# Patient Record
Sex: Female | Born: 1945
Health system: Southern US, Community
[De-identification: ages and names within clinical notes are randomized; demographics above are authoritative.]

## PROBLEM LIST (undated history)

## (undated) DIAGNOSIS — U099 Post covid-19 condition, unspecified: Secondary | ICD-10-CM

## (undated) DIAGNOSIS — G9001 Carotid sinus syncope: Secondary | ICD-10-CM

## (undated) DIAGNOSIS — E785 Hyperlipidemia, unspecified: Secondary | ICD-10-CM

## (undated) DIAGNOSIS — E063 Autoimmune thyroiditis: Secondary | ICD-10-CM

## (undated) DIAGNOSIS — Z87440 Personal history of urinary (tract) infections: Secondary | ICD-10-CM

## (undated) DIAGNOSIS — K5732 Diverticulitis of large intestine without perforation or abscess without bleeding: Secondary | ICD-10-CM

## (undated) DIAGNOSIS — N3020 Other chronic cystitis without hematuria: Secondary | ICD-10-CM

## (undated) DIAGNOSIS — Z8632 Personal history of gestational diabetes: Secondary | ICD-10-CM

## (undated) DIAGNOSIS — M791 Myalgia, unspecified site: Secondary | ICD-10-CM

## (undated) DIAGNOSIS — I2510 Atherosclerotic heart disease of native coronary artery without angina pectoris: Secondary | ICD-10-CM

## (undated) DIAGNOSIS — I10 Essential (primary) hypertension: Secondary | ICD-10-CM

## (undated) DIAGNOSIS — Z973 Presence of spectacles and contact lenses: Secondary | ICD-10-CM

## (undated) DIAGNOSIS — E782 Mixed hyperlipidemia: Secondary | ICD-10-CM

## (undated) DIAGNOSIS — G8929 Other chronic pain: Secondary | ICD-10-CM

## (undated) DIAGNOSIS — E039 Hypothyroidism, unspecified: Secondary | ICD-10-CM

## (undated) DIAGNOSIS — R55 Syncope and collapse: Secondary | ICD-10-CM

## (undated) DIAGNOSIS — O24419 Gestational diabetes mellitus in pregnancy, unspecified control: Secondary | ICD-10-CM

## (undated) DIAGNOSIS — N950 Postmenopausal bleeding: Secondary | ICD-10-CM

## (undated) DIAGNOSIS — K219 Gastro-esophageal reflux disease without esophagitis: Secondary | ICD-10-CM

## (undated) DIAGNOSIS — E119 Type 2 diabetes mellitus without complications: Secondary | ICD-10-CM

## (undated) DIAGNOSIS — M9909 Segmental and somatic dysfunction of abdomen and other regions: Secondary | ICD-10-CM

## (undated) DIAGNOSIS — N840 Polyp of corpus uteri: Secondary | ICD-10-CM

## (undated) DIAGNOSIS — M533 Sacrococcygeal disorders, not elsewhere classified: Secondary | ICD-10-CM

## (undated) DIAGNOSIS — H04123 Dry eye syndrome of bilateral lacrimal glands: Secondary | ICD-10-CM

## (undated) HISTORY — PX: TUBAL LIGATION: SHX77

## (undated) HISTORY — DX: Hyperlipidemia, unspecified: E78.5

## (undated) HISTORY — PX: OTHER SURGICAL HISTORY: SHX169

## (undated) HISTORY — DX: Hypothyroidism, unspecified: E03.9

## (undated) HISTORY — DX: Diverticulitis of large intestine without perforation or abscess without bleeding: K57.32

## (undated) HISTORY — PX: FIBULA FRACTURE SURGERY: SHX947

## (undated) HISTORY — DX: Syncope and collapse: R55

## (undated) HISTORY — DX: Essential (primary) hypertension: I10

## (undated) HISTORY — PX: TONSILLECTOMY: SUR1361

## (undated) HISTORY — DX: Carotid sinus syncope: G90.01

## (undated) HISTORY — DX: Gestational diabetes mellitus in pregnancy, unspecified control: O24.419

---
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
## 1987-11-13 HISTORY — PX: TUBAL LIGATION: SHX77

## 1998-03-18 ENCOUNTER — Ambulatory Visit: Admission: RE | Admit: 1998-03-18 | Discharge: 1998-03-18 | Payer: Self-pay | Admitting: Internal Medicine

## 1999-08-02 ENCOUNTER — Ambulatory Visit (HOSPITAL_COMMUNITY): Admission: RE | Admit: 1999-08-02 | Discharge: 1999-08-02 | Payer: Self-pay | Admitting: Internal Medicine

## 1999-08-02 ENCOUNTER — Encounter: Payer: Self-pay | Admitting: Internal Medicine

## 2000-08-02 ENCOUNTER — Ambulatory Visit (HOSPITAL_COMMUNITY): Admission: RE | Admit: 2000-08-02 | Discharge: 2000-08-02 | Payer: Self-pay | Admitting: Internal Medicine

## 2000-08-02 ENCOUNTER — Encounter: Payer: Self-pay | Admitting: Internal Medicine

## 2000-10-07 ENCOUNTER — Other Ambulatory Visit: Admission: RE | Admit: 2000-10-07 | Discharge: 2000-10-07 | Payer: Self-pay | Admitting: Internal Medicine

## 2000-10-10 ENCOUNTER — Encounter: Admission: RE | Admit: 2000-10-10 | Discharge: 2000-10-10 | Payer: Self-pay | Admitting: Internal Medicine

## 2000-10-10 ENCOUNTER — Encounter: Payer: Self-pay | Admitting: Internal Medicine

## 2001-10-20 ENCOUNTER — Encounter: Payer: Self-pay | Admitting: *Deleted

## 2001-10-20 ENCOUNTER — Emergency Department (HOSPITAL_COMMUNITY): Admission: EM | Admit: 2001-10-20 | Discharge: 2001-10-20 | Payer: Self-pay | Admitting: Emergency Medicine

## 2001-10-20 HISTORY — PX: ORIF ANKLE FRACTURE: SHX5408

## 2001-10-21 ENCOUNTER — Ambulatory Visit (HOSPITAL_BASED_OUTPATIENT_CLINIC_OR_DEPARTMENT_OTHER): Admission: RE | Admit: 2001-10-21 | Discharge: 2001-10-21 | Payer: Self-pay | Admitting: Orthopedic Surgery

## 2002-11-23 ENCOUNTER — Ambulatory Visit (HOSPITAL_COMMUNITY): Admission: RE | Admit: 2002-11-23 | Discharge: 2002-11-23 | Payer: Self-pay | Admitting: Internal Medicine

## 2002-11-23 ENCOUNTER — Encounter: Payer: Self-pay | Admitting: Internal Medicine

## 2003-07-05 ENCOUNTER — Encounter: Payer: Self-pay | Admitting: Internal Medicine

## 2003-09-30 ENCOUNTER — Encounter: Admission: RE | Admit: 2003-09-30 | Discharge: 2003-09-30 | Payer: Self-pay | Admitting: Internal Medicine

## 2004-08-01 ENCOUNTER — Ambulatory Visit: Payer: Self-pay | Admitting: Internal Medicine

## 2005-01-12 ENCOUNTER — Encounter: Admission: RE | Admit: 2005-01-12 | Discharge: 2005-01-12 | Payer: Self-pay | Admitting: Internal Medicine

## 2005-02-16 ENCOUNTER — Ambulatory Visit: Payer: Self-pay | Admitting: Internal Medicine

## 2005-02-16 ENCOUNTER — Other Ambulatory Visit: Admission: RE | Admit: 2005-02-16 | Discharge: 2005-02-16 | Payer: Self-pay | Admitting: Internal Medicine

## 2005-10-17 ENCOUNTER — Ambulatory Visit: Payer: Self-pay | Admitting: Internal Medicine

## 2006-04-03 ENCOUNTER — Ambulatory Visit (HOSPITAL_COMMUNITY): Admission: RE | Admit: 2006-04-03 | Discharge: 2006-04-03 | Payer: Self-pay | Admitting: Internal Medicine

## 2006-04-03 ENCOUNTER — Ambulatory Visit: Payer: Self-pay | Admitting: Internal Medicine

## 2006-07-02 ENCOUNTER — Ambulatory Visit: Payer: Self-pay | Admitting: Internal Medicine

## 2006-07-23 ENCOUNTER — Encounter: Payer: Self-pay | Admitting: Internal Medicine

## 2006-11-14 ENCOUNTER — Encounter: Payer: Self-pay | Admitting: Internal Medicine

## 2006-11-14 LAB — HM COLONOSCOPY

## 2006-11-27 ENCOUNTER — Ambulatory Visit: Payer: Self-pay | Admitting: Internal Medicine

## 2006-11-27 LAB — CONVERTED CEMR LAB
ALT: 15 units/L (ref 0–40)
Alkaline Phosphatase: 42 units/L (ref 39–117)
Bilirubin, Direct: 0.1 mg/dL (ref 0.0–0.3)
LDL Cholesterol: 94 mg/dL (ref 0–99)
Total Bilirubin: 0.8 mg/dL (ref 0.3–1.2)
Total Protein: 7.4 g/dL (ref 6.0–8.3)
Triglycerides: 87 mg/dL (ref 0–149)
VLDL: 17 mg/dL (ref 0–40)

## 2007-05-07 ENCOUNTER — Encounter: Payer: Self-pay | Admitting: Internal Medicine

## 2007-05-07 DIAGNOSIS — E785 Hyperlipidemia, unspecified: Secondary | ICD-10-CM

## 2007-05-07 DIAGNOSIS — I10 Essential (primary) hypertension: Secondary | ICD-10-CM | POA: Insufficient documentation

## 2007-05-07 DIAGNOSIS — R7989 Other specified abnormal findings of blood chemistry: Secondary | ICD-10-CM | POA: Insufficient documentation

## 2007-05-30 ENCOUNTER — Ambulatory Visit (HOSPITAL_COMMUNITY): Admission: RE | Admit: 2007-05-30 | Discharge: 2007-05-30 | Payer: Self-pay | Admitting: Internal Medicine

## 2007-07-08 ENCOUNTER — Ambulatory Visit: Payer: Self-pay | Admitting: Internal Medicine

## 2007-08-04 ENCOUNTER — Telehealth: Payer: Self-pay | Admitting: Internal Medicine

## 2008-06-07 ENCOUNTER — Ambulatory Visit: Payer: Self-pay | Admitting: Internal Medicine

## 2008-06-07 DIAGNOSIS — H9209 Otalgia, unspecified ear: Secondary | ICD-10-CM | POA: Insufficient documentation

## 2008-06-18 ENCOUNTER — Emergency Department (HOSPITAL_COMMUNITY): Admission: EM | Admit: 2008-06-18 | Discharge: 2008-06-18 | Payer: Self-pay | Admitting: Emergency Medicine

## 2008-08-13 ENCOUNTER — Telehealth: Payer: Self-pay | Admitting: *Deleted

## 2008-09-07 ENCOUNTER — Encounter: Payer: Self-pay | Admitting: Internal Medicine

## 2008-09-14 ENCOUNTER — Ambulatory Visit (HOSPITAL_COMMUNITY): Admission: RE | Admit: 2008-09-14 | Discharge: 2008-09-14 | Payer: Self-pay | Admitting: Internal Medicine

## 2008-11-11 ENCOUNTER — Telehealth: Payer: Self-pay | Admitting: *Deleted

## 2009-02-03 ENCOUNTER — Encounter: Payer: Self-pay | Admitting: *Deleted

## 2009-02-03 ENCOUNTER — Encounter: Payer: Self-pay | Admitting: Internal Medicine

## 2009-02-03 LAB — CONVERTED CEMR LAB
ALT: 11 units/L
BUN: 17 mg/dL
Creatinine, Ser: 0.88 mg/dL
HDL: 50 mg/dL
Hemoglobin: 13.8 g/dL
TSH: 1.271 microintl units/mL

## 2009-05-03 ENCOUNTER — Encounter: Payer: Self-pay | Admitting: Internal Medicine

## 2009-05-03 ENCOUNTER — Other Ambulatory Visit: Admission: RE | Admit: 2009-05-03 | Discharge: 2009-05-03 | Payer: Self-pay | Admitting: Internal Medicine

## 2009-05-03 ENCOUNTER — Ambulatory Visit: Payer: Self-pay | Admitting: Internal Medicine

## 2009-05-03 DIAGNOSIS — N3941 Urge incontinence: Secondary | ICD-10-CM | POA: Insufficient documentation

## 2009-05-03 DIAGNOSIS — C4490 Unspecified malignant neoplasm of skin, unspecified: Secondary | ICD-10-CM

## 2009-05-27 ENCOUNTER — Encounter: Payer: Self-pay | Admitting: *Deleted

## 2009-06-07 ENCOUNTER — Ambulatory Visit: Payer: Self-pay | Admitting: Internal Medicine

## 2009-06-13 LAB — CONVERTED CEMR LAB
Alkaline Phosphatase: 38 units/L — ABNORMAL LOW (ref 39–117)
Cholesterol: 266 mg/dL — ABNORMAL HIGH (ref 0–200)
Total Protein: 7.1 g/dL (ref 6.0–8.3)
VLDL: 39.4 mg/dL (ref 0.0–40.0)

## 2009-08-18 ENCOUNTER — Telehealth: Payer: Self-pay | Admitting: Internal Medicine

## 2009-08-26 ENCOUNTER — Ambulatory Visit: Payer: Self-pay | Admitting: Internal Medicine

## 2009-09-06 ENCOUNTER — Encounter: Payer: Self-pay | Admitting: Internal Medicine

## 2009-09-06 ENCOUNTER — Telehealth: Payer: Self-pay | Admitting: Internal Medicine

## 2009-09-06 DIAGNOSIS — R059 Cough, unspecified: Secondary | ICD-10-CM | POA: Insufficient documentation

## 2009-09-06 DIAGNOSIS — R05 Cough: Secondary | ICD-10-CM

## 2009-09-07 ENCOUNTER — Ambulatory Visit: Payer: Self-pay | Admitting: Internal Medicine

## 2009-09-08 ENCOUNTER — Ambulatory Visit: Payer: Self-pay | Admitting: Internal Medicine

## 2009-09-08 DIAGNOSIS — J042 Acute laryngotracheitis: Secondary | ICD-10-CM | POA: Insufficient documentation

## 2009-09-08 DIAGNOSIS — N760 Acute vaginitis: Secondary | ICD-10-CM | POA: Insufficient documentation

## 2009-09-08 DIAGNOSIS — J309 Allergic rhinitis, unspecified: Secondary | ICD-10-CM | POA: Insufficient documentation

## 2009-09-09 ENCOUNTER — Telehealth: Payer: Self-pay | Admitting: *Deleted

## 2009-10-13 ENCOUNTER — Telehealth: Payer: Self-pay | Admitting: *Deleted

## 2009-10-26 ENCOUNTER — Encounter (INDEPENDENT_AMBULATORY_CARE_PROVIDER_SITE_OTHER): Payer: Self-pay | Admitting: *Deleted

## 2009-11-12 DIAGNOSIS — R55 Syncope and collapse: Secondary | ICD-10-CM

## 2009-11-12 HISTORY — DX: Syncope and collapse: R55

## 2010-01-31 ENCOUNTER — Ambulatory Visit (HOSPITAL_COMMUNITY): Admission: RE | Admit: 2010-01-31 | Discharge: 2010-01-31 | Payer: Self-pay | Admitting: Internal Medicine

## 2010-02-10 DIAGNOSIS — Z87898 Personal history of other specified conditions: Secondary | ICD-10-CM

## 2010-02-10 HISTORY — DX: Personal history of other specified conditions: Z87.898

## 2010-05-09 ENCOUNTER — Ambulatory Visit: Payer: Self-pay | Admitting: Internal Medicine

## 2010-05-09 DIAGNOSIS — T50995A Adverse effect of other drugs, medicaments and biological substances, initial encounter: Secondary | ICD-10-CM | POA: Insufficient documentation

## 2010-05-19 ENCOUNTER — Encounter (INDEPENDENT_AMBULATORY_CARE_PROVIDER_SITE_OTHER): Payer: Self-pay

## 2010-05-19 LAB — CONVERTED CEMR LAB
BUN: 19 mg/dL (ref 6–23)
CO2: 30 meq/L (ref 19–32)
Calcium: 9.5 mg/dL (ref 8.4–10.5)
Creatinine, Ser: 0.8 mg/dL (ref 0.4–1.2)
GFR calc non Af Amer: 79.4 mL/min (ref >60)
Hgb A1c MFr Bld: 6.7 % — ABNORMAL HIGH (ref 4.6–6.5)
Sodium: 141 meq/L (ref 135–145)

## 2010-06-02 ENCOUNTER — Telehealth: Payer: Self-pay | Admitting: *Deleted

## 2010-08-30 ENCOUNTER — Telehealth: Payer: Self-pay | Admitting: Internal Medicine

## 2010-09-22 DIAGNOSIS — N950 Postmenopausal bleeding: Secondary | ICD-10-CM | POA: Insufficient documentation

## 2010-12-04 ENCOUNTER — Telehealth: Payer: Self-pay | Admitting: *Deleted

## 2010-12-12 NOTE — Progress Notes (Signed)
Summary: refill on zolpidem   Phone Note From Pharmacy   Caller: [facility]* Reason for Call: Needs renewal Details for Reason: zolpidem 5mg  Summary of Call: last filled on 623/11 #30 Initial call taken by: Romualdo Bolk, CMA (AAMA),  June 02, 2010 10:29 AM  Follow-up for Phone Call        ok x 2  Follow-up by: Madelin Headings MD,  June 02, 2010 12:27 PM  Additional Follow-up for Phone Call Additional follow up Details #1::        Rx faxed to pharmacy Additional Follow-up by: Romualdo Bolk, CMA Duncan Dull),  June 02, 2010 1:06 PM    Prescriptions: AMBIEN 5 MG TABS (ZOLPIDEM TARTRATE) 1 by mouth at bedtime as needed  #30 x 1   Entered by:   Romualdo Bolk, CMA (AAMA)   Authorized by:   Madelin Headings MD   Signed by:   Romualdo Bolk, CMA (AAMA) on 06/02/2010   Method used:   Handwritten   RxID:   [id number]

## 2010-12-12 NOTE — Letter (Signed)
Summary: Generic Letter  [place] at [facility]  [address] [place], [place] [address]   Phone: [phone]  Fax: [phone]    05/19/2010  Ellar Kostka [address] [place], [place]  [address]  Dear Ms. Pasty Spillers,           Sincerely,   Tomma Lightning, RN

## 2010-12-12 NOTE — Assessment & Plan Note (Signed)
Summary: discuss coughing on bp meds/ssc   Vital Signs:  Patient profile:   65 year old female Menstrual status:  postmenopausal Height:      65.5 inches Weight:      160 pounds BMI:     26.32 Pulse rate:   78 / minute BP sitting:   110 / 70  (left arm) Cuff size:   regular  Vitals Entered By: Romualdo Bolk, CMA (AAMA) (May 09, 2010 8:19 AM)  Serial Vital Signs/Assessments:  Time      Position  BP       Pulse  Resp  Temp     By                     130/80                         Madelin Headings MD  Comments: left arm sitting By: Madelin Headings MD   CC: Pt seen at [facility] for chest pain and almost passed out. Pt was bradycardiac and admited overnight. Everything neg. This was due to Benicar. This was in April 2011., Hypertension Management   History of Present Illness: Valerie Brady comes in today   for follow up of cough  poss related to meds .    Tried benicar but had an episode of sever cp nausea?  and near syncop e and was emergently evaluated at  [facility] and had nl labs andCardiac  eval negative . was felt tp be a se of benicar and then  a vagal reaaction.   since that time has stayed off the enalapril with help of the cough . THinks still some componenet coud have been viral or environmental cause.    Mom is now is hospital with serious disease.   ? what to do with bp meds  BP readings have been better with  lifestyle intervention .    Hypertension History:      She denies headache, chest pain, palpitations, dyspnea with exertion, orthopnea, PND, peripheral edema, visual symptoms, neurologic problems, syncope, and side effects from treatment.  She notes no problems with any antihypertensive medication side effects.        Positive major cardiovascular risk factors include female age 85 years old or older, hyperlipidemia, and hypertension.  Negative major cardiovascular risk factors include non-tobacco-user status.     Preventive  Screening-Counseling & Management  Alcohol-Tobacco     Alcohol drinks/day: 1     Alcohol type: wine     Smoking Status: never  Caffeine-Diet-Exercise     Caffeine use/day: 1     Does Patient Exercise: yes     Type of exercise: walking     Times/week: 3  Current Medications (verified): 1)  Levoxyl 150 Mcg Tabs (Levothyroxine Sodium) .... Take 1 Tablet By Mouth Once A Day 2)  Ambien 5 Mg Tabs (Zolpidem Tartrate) .Marland Kitchen.. 1 By Mouth At Bedtime As Needed  Allergies (verified): 1)  ! Benicar (Olmesartan Medoxomil) 2)  ! Enalapril Maleate (Enalapril Maleate)  Past History:  Past medical, surgical, family and social histories (including risk factors) reviewed, and no changes noted (except as noted below).  Past Medical History: Hyperlipidemia Hypertension Hypothyroidism   G3P2 Carotidynia Gestational DM  Varicella as a child  neg card evaluation  for dizziness    syncope  [facility]  reaction to benicar 2011  Past Surgical History: Reviewed history from 06/07/2008 and no changes required.  Tubal ligation Tonsillectomy Open Reduction and Pinning of Spiral Facture of Left Fibula   Past History:  Care Management: None Current Ophthalmology:  Dione Booze  Gastroenterology: [facility] Dermatology: Emily Filbert  Family History: Reviewed history from 05/03/2009 and no changes required. Father: Heart disease, smoker, Glucose intolerance, dementia  86 Mother: HBP, dementia  12  Siblings: Younger Brother: HBP, CP   Social History: Reviewed history from 05/03/2009 and no changes required. 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  The patient denies anorexia, fever, weight loss, weight gain, vision loss, hoarseness, dyspnea on exertion, peripheral edema, headaches, abdominal pain, enlarged lymph nodes, and  angioedema.    Physical Exam  General:  Well-developed,well-nourished,in no acute distress; alert,appropriate and cooperative throughout examination Neck:  No deformities, masses, or tenderness noted. Lungs:  Normal respiratory effort, chest expands symmetrically. Lungs are clear to auscultation, no crackles or wheezes. Heart:  Normal rate and regular rhythm. S1 and S2 normal without gallop, murmur, click, rub or other extra sounds. Extremities:  no clubbing cyanosis or edema  Neurologic:  grossly non focal  Psych:  Oriented X3, normally interactive, good eye contact, and not anxious appearing.     Impression & Recommendations:  Problem # 1:  COUGH (ICD-786.2) poss from ace   better off but could be an augmenter .  BP si doing ok now but f needed to restart  a different med  Problem # 2:  HYPERTENSION (ICD-401.9) Assessment: Improved  today still controlled with   lifestyle intervention  follow    The following medications were removed from the medication list:    Enalapril Maleate 5 Mg Tabs (Enalapril maleate) .Marland Kitchen... Take 1 tablet by mouth once a day  Orders: Venipuncture ([address]) TLB-BMP (Basic Metabolic Panel-BMET) (80048-METABOL)  BP today: 110/70 Prior BP: 108/70 (09/08/2009)  10 Yr Risk Heart Disease: 11 %  Labs Reviewed: Creat: : 0.88 (02/03/2009)   Chol: 266 (06/07/2009)   HDL: 41.80 (06/07/2009)   LDL: 192 (02/03/2009)   TG: 197.0 (06/07/2009)  Problem # 3:  HYPERGLYCEMIA, FASTING (ICD-790.6) check today   Orders: Venipuncture ([address]) TLB-A1C / Hgb A1C (Glycohemoglobin) (83036-A1C)  Problem # 4:  ADVERSE REACTION TO MEDICATION (ICD-995.29)   bennicar  Gi vagal syncope   but no angioedema   had CV eval  at [facility]. negative  had labs done in hospital  and were good  get reords sent to [place] eventually.  Complete Medication List: 1)  Levoxyl 150 Mcg Tabs (Levothyroxine sodium) .... Take 1 tablet by mouth once a day 2)  Ambien 5 Mg Tabs (Zolpidem tartrate) .Marland Kitchen.. 1 by  mouth at bedtime as needed  Hypertension Assessment/Plan:      The patient's hypertensive risk group is category B: At least one risk factor (excluding diabetes) with no target organ damage.  Her calculated 10 year risk of coronary heart disease is 11 %.  Today's blood pressure is 110/70.  Her blood pressure goal is < 140/90.  Patient Instructions: 1)  continue to  monitor BP readings and if elevated  2)  call and consider  low dose  losartan .  3)  get  records labs from [facility].  and cardiology. 4)  You will be informed of lab results when available.

## 2010-12-12 NOTE — Progress Notes (Signed)
Summary: please return call  Phone Note Call from Patient Call back at Home Phone [phone] Call back at [phone] Cell   Caller: Patient---voice mail Reason for Call: Talk to Nurse Summary of Call: has a couple of days of dark blood from her vagina. please advise. Initial call taken by: Warnell Forester,  August 30, 2010 9:26 AM  Follow-up for Phone Call        Spoke with pt and it was only when she wiped herself. It looks like it is old blood. Pt hasn't had a hysterectomy. Pt states that she didn't have to wear a pad. Follow-up by: Romualdo Bolk, CMA Duncan Dull),  August 30, 2010 10:10 AM  Additional Follow-up for Phone Call Additional follow up Details #1::        phone busy Additional Follow-up by: Madelin Headings MD,  August 30, 2010 1:03 PM    Additional Follow-up for Phone Call Additional follow up Details #2::    phone line busy . Follow-up by: Madelin Headings MD,  August 30, 2010 5:23 PM  Additional Follow-up for Phone Call Additional follow up Details #3:: Details for Additional Follow-up Action Taken: Phone line busy. Romualdo Bolk, CMA (AAMA)  August 31, 2010 8:08 AM Pt states that she can be called on Cell or home phone anytime after 5pm. Romualdo Bolk, CMA Duncan Dull)  September 01, 2010 2:29 PM   have called pat on cell x 2 in the last   5 days . had to leave message.  Madelin Headings MD  September 06, 2010 2:07 PM   Appended Document: please return call    Phone Note Call from Patient   Summary of Call: patient called back.   last pm  Had episode of vaginal spotting last month andnow has ongoing lower abd crampy feeling  . Could be IBS but Seems different. Has hx of fibroids but is post menopausal.   Rec we get GYNE consult  to evaluated .    Will refer to Dr Dierdre Forth Initial call taken by: Madelin Headings MD,  September 22, 2010 8:12 AM  New Problems: POSTMENOPAUSAL BLEEDING (ICD-627.1)   New Problems: POSTMENOPAUSAL BLEEDING  (ICD-627.1)

## 2010-12-14 NOTE — Progress Notes (Signed)
Summary: refill on levothroid  Phone Note From Pharmacy   Caller: [facility]* Reason for Call: Needs renewal Details for Reason: Levothroid Summary of Call: Last TSH was done on 02/03/2009. Do you want her to have a cpx or just lab work? Initial call taken by: Romualdo Bolk, CMA Duncan Dull),  December 04, 2010 3:48 PM  Follow-up for Phone Call        call patient and ask iflabs done elsewhere in the year. other wise refill and  would recheck tsh and perhpas lipids and a1c    Follow-up by: Madelin Headings MD,  December 05, 2010 1:27 PM  Additional Follow-up for Phone Call Additional follow up Details #1::        Rx sent to pharmacy and left message for pt to call back. Additional Follow-up by: Romualdo Bolk, CMA Duncan Dull),  December 05, 2010 2:42 PM    Additional Follow-up for Phone Call Additional follow up Details #2::    Pt aware of this and will call back to schedule a lab appt. Follow-up by: Romualdo Bolk, CMA (AAMA),  December 06, 2010 2:18 PM  Prescriptions: LEVOXYL 150 MCG TABS (LEVOTHYROXINE SODIUM) Take 1 tablet by mouth once a day  #30 Each x 0   Entered by:   Romualdo Bolk, CMA (AAMA)   Authorized by:   Madelin Headings MD   Signed by:   Romualdo Bolk, CMA (AAMA) on 12/05/2010   Method used:   Electronically to        [facility] (retail)       [address]       [place], [place]  [id number]       Ph: [phone]       Fax: [phone]   RxID:   [id number]

## 2011-01-01 ENCOUNTER — Other Ambulatory Visit: Payer: Self-pay | Admitting: Internal Medicine

## 2011-01-10 ENCOUNTER — Other Ambulatory Visit: Payer: BC Managed Care – PPO | Admitting: Internal Medicine

## 2011-01-10 DIAGNOSIS — R7301 Impaired fasting glucose: Secondary | ICD-10-CM

## 2011-01-10 DIAGNOSIS — E785 Hyperlipidemia, unspecified: Secondary | ICD-10-CM

## 2011-01-10 DIAGNOSIS — T887XXA Unspecified adverse effect of drug or medicament, initial encounter: Secondary | ICD-10-CM

## 2011-01-10 DIAGNOSIS — E119 Type 2 diabetes mellitus without complications: Secondary | ICD-10-CM

## 2011-01-10 DIAGNOSIS — Z0000 Encounter for general adult medical examination without abnormal findings: Secondary | ICD-10-CM

## 2011-01-10 DIAGNOSIS — E039 Hypothyroidism, unspecified: Secondary | ICD-10-CM

## 2011-01-10 LAB — TSH: TSH: 0.09 u[IU]/mL — ABNORMAL LOW (ref 0.35–5.50)

## 2011-01-10 LAB — LIPID PANEL
HDL: 52 mg/dL (ref >39.00)
Total CHOL/HDL Ratio: 5
VLDL: 34.2 mg/dL (ref 0.0–40.0)

## 2011-01-10 LAB — HEPATIC FUNCTION PANEL
ALT: 15 U/L (ref 0–35)
Albumin: 4.1 g/dL (ref 3.5–5.2)
Bilirubin, Direct: 0.1 mg/dL (ref 0.0–0.3)
Total Bilirubin: 0.7 mg/dL (ref 0.3–1.2)
Total Protein: 6.9 g/dL (ref 6.0–8.3)

## 2011-01-10 LAB — HEMOGLOBIN A1C: Hgb A1c MFr Bld: 6.9 % — ABNORMAL HIGH (ref 4.6–6.5)

## 2011-01-18 ENCOUNTER — Telehealth: Payer: Self-pay | Admitting: *Deleted

## 2011-01-18 NOTE — Telephone Encounter (Signed)
Left message to call back  

## 2011-01-18 NOTE — Telephone Encounter (Signed)
Message copied by Tor Netters on Thu Jan 18, 2011 11:04 AM ------      Message from: [facility]      Created: Thu Jan 18, 2011  8:29 AM       Tell patient results show A1c is somewhat worse   And tsh is low  We should adjust meds.  Confirm med list and dosing  And or ov to discuss. Send her copy of l;abs

## 2011-01-23 NOTE — Telephone Encounter (Signed)
Left message on machine about results and to call us to confirm meds and dosages or call and make an appt

## 2011-01-26 NOTE — Telephone Encounter (Signed)
Left message to call back and mailed letter

## 2011-02-01 ENCOUNTER — Telehealth: Payer: Self-pay | Admitting: *Deleted

## 2011-02-01 NOTE — Telephone Encounter (Signed)
Pt states that she is taking Levothyoxine .

## 2011-02-01 NOTE — Telephone Encounter (Signed)
See other phone note

## 2011-02-05 ENCOUNTER — Other Ambulatory Visit: Payer: Self-pay | Admitting: *Deleted

## 2011-02-05 DIAGNOSIS — E039 Hypothyroidism, unspecified: Secondary | ICD-10-CM

## 2011-02-05 MED ORDER — SYNTHROID 137 MCG PO TABS: 137.0000 ug | ORAL_TABLET | Freq: Every day | ORAL | Status: DC

## 2011-02-05 NOTE — Telephone Encounter (Signed)
I cannot tell with this message   Means. I thought I recommended 0.137 and to recheck a TSH in 2-3 months. I cannot tell from this phone message if it was sent in. Do I need to respond In anyother way?

## 2011-02-05 NOTE — Telephone Encounter (Signed)
Agree with decreasing synthroid to .137 mg      Brand disp 90 days or 3 30 days Recheck tsh in 2-3 months

## 2011-02-06 NOTE — Telephone Encounter (Signed)
Rx taken care of and pt aware of this.

## 2011-03-30 NOTE — H&P (Signed)
[place]. [facility]  Patient:    Valerie Brady, Valerie Brady Visit Number: [id number] MRN: [id number]          Service Type: EXP Location: [facility] Attending Physician:  Cathren Laine Dictated by:   Elana Alm Thurston Hole, M.D. Admit Date:  10/20/2001 Discharge Date: 10/20/2001                           History and Physical  PREOPERATIVE DIAGNOSIS:  Left ankle distal fibular fracture.  POSTOPERATIVE DIAGNOSIS:  Left ankle distal fibular fracture.  PROCEDURES:  Open reduction and internal fixation of left ankle distal fibular fracture.  SURGEON:  Elana Alm. Thurston Hole, M.D.  ASSISTANT:  Julien Girt, P.A.  ANESTHESIA:  General.  OPERATIVE TIME:  45 minutes.  COMPLICATIONS:  None.  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  DESCRIPTION OF PROCEDURE:  Dr. Pasty Spillers was brought to the operating room on October 21, 2001, and placed on the operating table in the supine position. After an adequate level of general anesthesia was obtained, her left foot and leg was prepped using sterile Betadine and draped using sterile technique. Originally the leg was exsanguinated and a calf tourniquet elevated 300 mm.  A 5-7 cm longitudinal distal lateral fibular incision was made.  The underlying subcutaneous tissues were incised in line with the skin incision.  The peritoneal tendons and sural nerve were carefully protected posteriorly while the fracture was identified and exposed.  The fracture was reduced with a reduction clamp and then held there while an anterior to posterior 4.0 mm lag screw was placed giving anatomic reduction of this.  A five-hole one-third tubular plate was then placed on the posterolateral surface.  The two most distal screw holes drilled measured, tapped, and the appropriate length 4.0  mm screws placed and the two most proximal screw holes drilled, measured, tapped, and the appropriate length 3.5 mm cortical screws placed.  AP and lateral fluoroscopic x-rays then confirmed anatomic reduction and rigid internal fixation.  At this point, the wound was irrigated and closed using 2-0 Vicryl and skin staples.  Sterile dressings and a short-leg splint were applied.  The tourniquet was released.  The patient was awakened and taken to the recovery room in stable condition.  FOLLOW-UP CARE:  Dr. Pasty Spillers will be followed overnight at the recovery care center for IV pain control and neurovascular monitoring.  Discharged tomorrow on Percocet.  Seen back in the office in a week for sutures out and follow-up. Dictated by:   Elana Alm Thurston Hole, M.D. Attending Physician:  Cathren Laine DD:  10/22/01 TD:  10/22/01 Job: [id number]

## 2011-05-18 ENCOUNTER — Telehealth: Payer: Self-pay | Admitting: *Deleted

## 2011-05-18 DIAGNOSIS — E039 Hypothyroidism, unspecified: Secondary | ICD-10-CM

## 2011-05-18 MED ORDER — SYNTHROID 137 MCG PO TABS: 137.0000 ug | ORAL_TABLET | Freq: Every day | ORAL | Status: DC

## 2011-05-18 NOTE — Telephone Encounter (Signed)
Order placed in Epic and rx sent to pharmacy

## 2011-05-18 NOTE — Telephone Encounter (Signed)
Pt was told to return in 2 mnths to recheck TSH due to change in medication.  Appt scheduled for 05/25/11, need an order entered for TSH.  Also, pt requesting rx refill of generic synthroid sent to [facility].

## 2011-05-24 ENCOUNTER — Other Ambulatory Visit: Payer: BC Managed Care – PPO

## 2011-05-25 ENCOUNTER — Telehealth: Payer: Self-pay | Admitting: *Deleted

## 2011-05-25 MED ORDER — SYNTHROID 137 MCG PO TABS: 137.0000 ug | ORAL_TABLET | Freq: Every day | ORAL | Status: DC

## 2011-05-25 NOTE — Telephone Encounter (Signed)
Pt needs a refill on synthroid. Rx sent to pharmacy.

## 2011-08-07 ENCOUNTER — Telehealth: Payer: Self-pay | Admitting: *Deleted

## 2011-08-07 ENCOUNTER — Encounter: Payer: Self-pay | Admitting: Internal Medicine

## 2011-08-07 NOTE — Telephone Encounter (Signed)
Error/ssc

## 2011-08-08 ENCOUNTER — Encounter: Payer: Self-pay | Admitting: Internal Medicine

## 2011-08-08 ENCOUNTER — Ambulatory Visit (INDEPENDENT_AMBULATORY_CARE_PROVIDER_SITE_OTHER): Payer: BC Managed Care – PPO | Admitting: Internal Medicine

## 2011-08-08 VITALS — BP 140/80 | HR 78 | Temp 98.7°F | Wt 160.0 lb

## 2011-08-08 DIAGNOSIS — R300 Dysuria: Secondary | ICD-10-CM | POA: Insufficient documentation

## 2011-08-08 DIAGNOSIS — I10 Essential (primary) hypertension: Secondary | ICD-10-CM

## 2011-08-08 DIAGNOSIS — R7989 Other specified abnormal findings of blood chemistry: Secondary | ICD-10-CM

## 2011-08-08 LAB — POCT URINALYSIS DIPSTICK
Blood, UA: NEGATIVE
Ketones, UA: NEGATIVE
Spec Grav, UA: 1.025
pH, UA: 5.5

## 2011-08-08 NOTE — Assessment & Plan Note (Signed)
Check a1c today

## 2011-08-08 NOTE — Patient Instructions (Signed)
Will call with results .Marland Kitchen See gyne if any spotting or concern  About polyps.

## 2011-08-08 NOTE — Progress Notes (Signed)
  Subjective:    Patient ID: Valerie Brady, female    DOB: [birth year], 65 y.o.   MRN: [id number]  HPI  Patient comes in today for SDA  For acute problem evaluation. Lower abd cramps   And no blood. ? If one spot once. Onset for the week without fever  Flank pain. Hasn't checked bp readings recently.  Had been ok.   Due for sugar check . Had cervical polyp removed before  Considering recurrence as cause of sx.    Review of Systems No fever resp new new gi issues as above.  Past history family history social history reviewed in the electronic medical record. Thyroid ok. stable    Objective:   Physical Exam  WDWN in nad looks well  heent grossly nl Bp readings reviewed  Chest nl resp Co rr  Abdomen:  Sof,t normal bowel sounds without hepatosplenomegaly, no guarding rebound or masses no CVA tenderness area suprapubic tender no mass . Neuro grossly non focal ua clear      Assessment & Plan:  Suprapubic pain poss dysuria Ur cx to check for low count uti.  If any spotting  Or continuation consider see gyne again.   HT  Lifestyle controlled at present   upt today but could be  Epic stress re;ated lease monitor  And call if elevated. persistantly  Hx hyperglycemia  Check a1c today.  infrom when available.

## 2011-08-16 ENCOUNTER — Encounter: Payer: Self-pay | Admitting: *Deleted

## 2011-09-02 ENCOUNTER — Other Ambulatory Visit: Payer: Self-pay | Admitting: Internal Medicine

## 2011-09-06 ENCOUNTER — Telehealth: Payer: Self-pay | Admitting: *Deleted

## 2011-09-06 NOTE — Telephone Encounter (Signed)
Error

## 2011-09-21 ENCOUNTER — Other Ambulatory Visit: Payer: Self-pay | Admitting: Dermatology

## 2011-10-08 ENCOUNTER — Telehealth: Payer: Self-pay | Admitting: *Deleted

## 2011-10-08 MED ORDER — LEVOTHYROXINE SODIUM 137 MCG PO TABS: 137.0000 ug | ORAL_TABLET | Freq: Every day | ORAL | Status: DC

## 2011-10-08 NOTE — Telephone Encounter (Signed)
Pt needs a refill on synthroid and wants more refills. Rx sent to pharmacy

## 2012-01-15 ENCOUNTER — Other Ambulatory Visit: Payer: Self-pay | Admitting: Internal Medicine

## 2012-01-15 DIAGNOSIS — Z1231 Encounter for screening mammogram for malignant neoplasm of breast: Secondary | ICD-10-CM

## 2012-01-16 ENCOUNTER — Ambulatory Visit (HOSPITAL_COMMUNITY)
Admission: RE | Admit: 2012-01-16 | Discharge: 2012-01-16 | Disposition: A | Discharge status: Home / Self Care | Payer: BC Managed Care – PPO | Source: Ambulatory Visit | Attending: Internal Medicine | Admitting: Internal Medicine

## 2012-01-16 DIAGNOSIS — Z1231 Encounter for screening mammogram for malignant neoplasm of breast: Secondary | ICD-10-CM

## 2012-04-24 ENCOUNTER — Other Ambulatory Visit: Payer: Self-pay | Admitting: Internal Medicine

## 2012-04-24 ENCOUNTER — Other Ambulatory Visit: Payer: Self-pay | Admitting: Family Medicine

## 2012-04-24 DIAGNOSIS — E079 Disorder of thyroid, unspecified: Secondary | ICD-10-CM

## 2012-04-24 NOTE — Telephone Encounter (Signed)
Spoke to the pt.  Informed her she will need to go and have a TSH performed.  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  Informed her she could go to any office.  Put in future orders for test.  Sent the prescription to the pharmacy #30 with 5 refills per Dr. Fabian Sharp.

## 2012-05-19 ENCOUNTER — Encounter: Payer: Self-pay | Admitting: Internal Medicine

## 2012-05-19 ENCOUNTER — Ambulatory Visit (INDEPENDENT_AMBULATORY_CARE_PROVIDER_SITE_OTHER): Payer: BC Managed Care – PPO | Admitting: Internal Medicine

## 2012-05-19 VITALS — BP 110/70 | HR 111 | Temp 98.4°F | Wt 156.0 lb

## 2012-05-19 DIAGNOSIS — K5792 Diverticulitis of intestine, part unspecified, without perforation or abscess without bleeding: Secondary | ICD-10-CM | POA: Insufficient documentation

## 2012-05-19 DIAGNOSIS — K5732 Diverticulitis of large intestine without perforation or abscess without bleeding: Secondary | ICD-10-CM

## 2012-05-19 DIAGNOSIS — R1032 Left lower quadrant pain: Secondary | ICD-10-CM | POA: Insufficient documentation

## 2012-05-19 DIAGNOSIS — E039 Hypothyroidism, unspecified: Secondary | ICD-10-CM | POA: Insufficient documentation

## 2012-05-19 LAB — CBC WITH DIFFERENTIAL/PLATELET
Basophils Absolute: 0 10*3/uL (ref 0.0–0.1)
Basophils Relative: 0.4 % (ref 0.0–3.0)
Eosinophils Absolute: 0.1 10*3/uL (ref 0.0–0.7)
Lymphocytes Relative: 41.1 % (ref 12.0–46.0)
Lymphs Abs: 3.5 10*3/uL (ref 0.7–4.0)
MCHC: 33.2 g/dL (ref 30.0–36.0)
Monocytes Relative: 6.1 % (ref 3.0–12.0)
RBC: 4.66 Mil/uL (ref 3.87–5.11)

## 2012-05-19 LAB — BASIC METABOLIC PANEL
BUN: 18 mg/dL (ref 6–23)
CO2: 24 mEq/L (ref 19–32)
Calcium: 9.2 mg/dL (ref 8.4–10.5)
Creatinine, Ser: 0.8 mg/dL (ref 0.4–1.2)
Glucose, Bld: 112 mg/dL — ABNORMAL HIGH (ref 70–99)
Potassium: 3.9 mEq/L (ref 3.5–5.1)

## 2012-05-19 LAB — POCT URINALYSIS DIP (MANUAL ENTRY)
Bilirubin, UA: NEGATIVE
Nitrite, UA: NEGATIVE
Spec Grav, UA: 1.025
Urobilinogen, UA: 0.2

## 2012-05-19 LAB — TSH: TSH: 0.72 u[IU]/mL (ref 0.35–5.50)

## 2012-05-19 MED ORDER — METRONIDAZOLE 500 MG PO TABS: 500.0000 mg | ORAL_TABLET | Freq: Three times a day (TID) | ORAL | Status: AC

## 2012-05-19 MED ORDER — CIPROFLOXACIN HCL 500 MG PO TABS: 500.0000 mg | ORAL_TABLET | Freq: Two times a day (BID) | ORAL | Status: AC

## 2012-05-19 NOTE — Progress Notes (Signed)
  Subjective:    Patient ID: Valerie Brady, female    DOB: 1945-12-19, 66 y.o.   MRN: [id number]  HPI Patient comes in today for SDA for  new problem evaluation. She had the acute onset about 48 hours ago at the middle the night of left lower quadrant pain that has waxed and waned since that time. No associated fever but has had some queasiness decrease in appetite but otherwise normal bowel movements. No T. UTI symptoms or vaginal symptoms no unusual bleeding. No vomiting but does have nausea.  Pain level on palpation is 5/10 otherwise low-grade. Has never had diverticulitis but has had a colonoscopy 5 years ago that was clear except for some diverticula. She has changed her diet and has a new type of granola bar. She has hypothyroidism on replacement is due for a monitoring check. Review of Systems Negative for chest pain shortness of breath fever chills back pain hematuria rest as per history of present illness. Past history family history social history reviewed in the electronic medical record. meds reviewed     Objective:   Physical Exam BP 110/70  Pulse 111  Temp 98.4 F (36.9 C) (Oral)  Wt 156 lb (70.761 kg)  SpO2 98% Well-developed well-nourished in no acute distress non toxic  Neck supple without adenopathy Chest:  Clear to A&P without wheezes rales or rhonchi CV:  S1-S2 no gallops or murmurs peripheral perfusion is normal Abdomen: Bowel sounds are present no organomegaly there is focal tenderness in the left lower quadrant without mass effect. No rebound some involuntary guarding at times. Negative psoas sign were negative peritoneal signs. Skin: normal capillary refill ,turgor , color: No acute rashes ,petechiae or bruising Oriented x 3 and no noted deficits in memory, attention, and speech.    Assessment & Plan:  Left lower tenderness and abdominal pain 48 hours, no fever suspect early mild diverticulitis. Last colonoscopy about 5 years ago normal except for some  diverticula. Empiric rx and fu as appropriate  Hypothyroid ;due for TSH monitoring.

## 2012-05-19 NOTE — Patient Instructions (Signed)
Will notify you  of labs when available. Take antibiotic and recheck if not improved in 48 hours or if have fever.  Plan fu with Dr Kinnie Scales as discussed.   Diverticulitis A diverticulum is a small pouch or sac on the colon. Diverticulosis is the presence of these diverticula on the colon. Diverticulitis is the irritation (inflammation) or infection of diverticula. CAUSES  The colon and its diverticula contain bacteria. If food particles block the tiny opening to a diverticulum, the bacteria inside can grow and cause an increase in pressure. This leads to infection and inflammation and is called diverticulitis. SYMPTOMS   Abdominal pain and tenderness. Usually, the pain is located on the left side of your abdomen. However, it could be located elsewhere.   Fever.   Bloating.   Feeling sick to your stomach (nausea).   Throwing up (vomiting).   Abnormal stools.  DIAGNOSIS  Your caregiver will take a history and perform a physical exam. Since many things can cause abdominal pain, other tests may be necessary. Tests may include:  Blood tests.   Urine tests.   X-ray of the abdomen.   CT scan of the abdomen.  Sometimes, surgery is needed to determine if diverticulitis or other conditions are causing your symptoms. TREATMENT  Most of the time, you can be treated without surgery. Treatment includes:  Resting the bowels by only having liquids for a few days. As you improve, you will need to eat a low-fiber diet.   Intravenous (IV) fluids if you are losing body fluids (dehydrated).   Antibiotic medicines that treat infections may be given.   Pain and nausea medicine, if needed.   Surgery if the inflamed diverticulum has burst.  HOME CARE INSTRUCTIONS   Try a clear liquid diet (broth, tea, or water for as long as directed by your caregiver). You may then gradually begin a low-fiber diet as tolerated. A low-fiber diet is a diet with less than 10 grams of fiber. Choose the foods below to  reduce fiber in the diet:   White breads, cereals, rice, and pasta.   Cooked fruits and vegetables or soft fresh fruits and vegetables without the skin.   Ground or well-cooked tender beef, ham, veal, lamb, pork, or poultry.   Eggs and seafood.   After your diverticulitis symptoms have improved, your caregiver may put you on a high-fiber diet. A high-fiber diet includes 14 grams of fiber for every 1000 calories consumed. For a standard 2000 calorie diet, you would need 28 grams of fiber. Follow these diet guidelines to help you increase the fiber in your diet. It is important to slowly increase the amount fiber in your diet to avoid gas, constipation, and bloating.   Choose whole-grain breads, cereals, pasta, and brown rice.   Choose fresh fruits and vegetables with the skin on. Do not overcook vegetables because the more vegetables are cooked, the more fiber is lost.   Choose more nuts, seeds, legumes, dried peas, beans, and lentils.   Look for food products that have greater than 3 grams of fiber per serving on the Nutrition Facts label.   Take all medicine as directed by your caregiver.   If your caregiver has given you a follow-up appointment, it is very important that you go. Not going could result in lasting (chronic) or permanent injury, pain, and disability. If there is any problem keeping the appointment, call to reschedule.  SEEK MEDICAL CARE IF:   Your pain does not improve.   You  have a hard time advancing your diet beyond clear liquids.   Your bowel movements do not return to normal.  SEEK IMMEDIATE MEDICAL CARE IF:   Your pain becomes worse.   You have an oral temperature above 102 F (38.9 C), not controlled by medicine.   You have repeated vomiting.   You have bloody or black, tarry stools.   Symptoms that brought you to your caregiver become worse or are not getting better.  MAKE SURE YOU:   Understand these instructions.   Will watch your condition.    Will get help right away if you are not doing well or get worse.  Document Released: 08/08/2005 Document Revised: 10/18/2011 Document Reviewed: 12/04/2010 [facility] Patient Information 2012 [place], [place].

## 2012-07-16 ENCOUNTER — Other Ambulatory Visit: Payer: Self-pay | Admitting: Dermatology

## 2012-10-02 ENCOUNTER — Encounter: Payer: Self-pay | Admitting: Internal Medicine

## 2012-10-02 ENCOUNTER — Ambulatory Visit (INDEPENDENT_AMBULATORY_CARE_PROVIDER_SITE_OTHER): Payer: BC Managed Care – PPO | Admitting: Internal Medicine

## 2012-10-02 ENCOUNTER — Telehealth: Payer: Self-pay | Admitting: Internal Medicine

## 2012-10-02 VITALS — BP 142/84 | HR 76 | Temp 98.3°F | Wt 159.0 lb

## 2012-10-02 DIAGNOSIS — J209 Acute bronchitis, unspecified: Secondary | ICD-10-CM

## 2012-10-02 MED ORDER — PREDNISONE 20 MG PO TABS: ORAL_TABLET | ORAL | Status: DC

## 2012-10-02 MED ORDER — LEVOFLOXACIN 500 MG PO TABS: 500.0000 mg | ORAL_TABLET | Freq: Every day | ORAL | Status: DC

## 2012-10-02 NOTE — Telephone Encounter (Signed)
Appt made by CAN. Encounter closed.  

## 2012-10-02 NOTE — Patient Instructions (Addendum)
Please call our office if your symptoms do not improve or gets worse.  

## 2012-10-02 NOTE — Telephone Encounter (Signed)
Patient Information:  Caller Name: Kele  Phone: [phone]  Patient: Valerie Brady, Valerie Brady  Gender: Female  DOB: [birth year]  Age: 66 Years  PCP: Berniece Andreas (Family Practice)   Symptoms  Reason For Call & Symptoms: Dr. Pasty Spillers calling, thinks that she has pneumonia.  Has had a cold and cough x 3 weeks, productive cough/yellow.  No fever.    Reviewed Health History In EMR: Yes  Reviewed Medications In EMR: Yes  Reviewed Allergies In EMR: Yes  Date of Onset of Symptoms: 09/08/2012  Treatments Tried: decongestants  Treatments Tried Worked: No  Guideline(s) Used:  Cough  Disposition Per Guideline:   See Today in Office  Reason For Disposition Reached:   Severe coughing spells (e.g., whooping sound after coughing, vomiting after coughing)  Advice Given:  N/A  Office Follow Up:  Does the office need to follow up with this patient?: No  Instructions For The Office: N/A  Appointment Scheduled:  10/02/2012 14:00:00 with Dr. Artist Pais

## 2012-10-02 NOTE — Assessment & Plan Note (Signed)
66 year old white female nonsmoker with signs and symptoms of acute bronchitis. She has coarse breath sounds and expiratory wheezing of left lung field. Treat with Levaquin 500 mg for 10 days and prednisone taper.  Patient advised to call office if symptoms persist or worsen.

## 2012-10-02 NOTE — Progress Notes (Signed)
  Subjective:    Patient ID: Valerie Brady, female    DOB: Jan 31, 1946, 66 y.o.   MRN: [id number]  HPI  66 year old white female complains of persistent cough for 3 -4 weeks. Her symptoms initially started with cold symptoms 3.5weeks ago. Symptoms initially got better but now getting worse. She recently returned from a trip to [place]. Patient reports cough is productive of yellowish sputum. She feels like sputum is coming from her left lung. She denies any fever or shortness of breath. She does have intermittent wheezing.   Review of Systems See HPI    Past Medical History  Diagnosis Date  . Hyperlipidemia   . Hypertension   . Hypothyroidism   . Gestational diabetes   . Carotidynia   . Syncope 2011    reaction to benicar    History   Social History  . Marital Status: Married    Spouse Name: N/A    Number of Children: N/A  . Years of Education: N/A   Occupational History  . Not on file.   Social History Main Topics  . Smoking status: Never Smoker   . Smokeless tobacco: Not on file  . Alcohol Use:   . Drug Use:   . Sexually Active:    Other Topics Concern  . 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 Tubal ligation   . Tonsillectomy   . Fibula fracture surgery     OPEN REDUCTION AND PINNING OF SPIRAL FX LEFT FIBULA  . Cervical polyp removed     Family History  Problem Relation Age of Onset  . Hypertension Mother   . Dementia Mother   . Heart disease Father   . Dementia Father   . Diabetes Father   . Hypertension Brother     Allergies  Allergen Reactions  . Enalapril Maleate     REACTION: Cough  . Olmesartan Medoxomil     REACTION: Gas and chest pain    Current Outpatient Prescriptions on File Prior to Visit  Medication Sig Dispense Refill  . levothyroxine (SYNTHROID, LEVOTHROID) 137 MCG tablet TAKE 1 TABLET EACH DAY.  30 tablet   5    BP 142/84  Pulse 76  Temp 98.3 F (36.8 C) (Oral)  Wt 159 lb (72.122 kg)  SpO2 97%    Objective:   Physical Exam  Constitutional: She appears well-developed and well-nourished.  HENT:  Head: Normocephalic and atraumatic.  Right Ear: External ear normal.  Left Ear: External ear normal.       Oropharyngeal erythema  Neck: Neck supple.       No neck tenderness  Cardiovascular: Normal rate, regular rhythm and normal heart sounds.   Pulmonary/Chest: Effort normal.       Coarse breath sounds left lung fields and expiratory wheeze  Lymphadenopathy:    She has no cervical adenopathy.  Neurological: She is alert. No cranial nerve deficit.  Skin: Skin is warm and dry.  Psychiatric: She has a normal mood and affect. Her behavior is normal.          Assessment & Plan:

## 2012-11-09 ENCOUNTER — Other Ambulatory Visit: Payer: Self-pay | Admitting: Internal Medicine

## 2012-11-28 ENCOUNTER — Encounter: Payer: Self-pay | Admitting: Internal Medicine

## 2012-11-28 ENCOUNTER — Ambulatory Visit (INDEPENDENT_AMBULATORY_CARE_PROVIDER_SITE_OTHER): Payer: Medicare Other | Admitting: Internal Medicine

## 2012-11-28 VITALS — BP 158/80 | HR 91 | Temp 98.6°F | Wt 161.0 lb

## 2012-11-28 DIAGNOSIS — R7989 Other specified abnormal findings of blood chemistry: Secondary | ICD-10-CM | POA: Diagnosis not present

## 2012-11-28 DIAGNOSIS — I10 Essential (primary) hypertension: Secondary | ICD-10-CM | POA: Diagnosis not present

## 2012-11-28 DIAGNOSIS — E039 Hypothyroidism, unspecified: Secondary | ICD-10-CM | POA: Diagnosis not present

## 2012-11-28 MED ORDER — LISINOPRIL 10 MG PO TABS: 10.0000 mg | ORAL_TABLET | Freq: Every day | ORAL | Status: DC

## 2012-11-28 NOTE — Progress Notes (Signed)
Chief Complaint  Patient presents with  . Elevated Blood Pressure Readings    Says her blood pressure has been up and down for 2 years.  When it is up she becomes very agitated.    HPI: Patient comes in today for  BP issues  problem evaluation.  Has had a history of hypertension treatment in the past ; enalapril ; it was stopped when she had a prolonged cough bronchitis. She isn't sure that the medicine actually caused a chronic problem. She also had a significant reaction to the Benicar hospitalization chest pain GI. No swelling.   BP currently up for about a few months .  Up in the 150 160 range  notices more when there is stress and adreniline surge. Occasional palpitations but no cardiovascular intolerance and doesn't related to thyroid replacement medication  Knee   Issues pain and swelling developed  After   Exercise.  Right.   Not on nsaids.  Gets  Gi se.  Does Tai chai.  Work  [employer]    2 days a week.   Thyroid on generic replacement ;stable ROS: See pertinent positives and negatives per HPI. No current chest pain shortness of breath syncope unusual bleeding see above. Has a history of hyperglycemia and gestational diabetes hasn't had sugars checked recently had labs in the summer.  Past Medical History  Diagnosis Date  . Hyperlipidemia   . Hypertension   . Hypothyroidism   . Gestational diabetes   . Carotidynia   . Syncope 2011    reaction to benicar    Family History  Problem Relation Age of Onset  . Hypertension Mother   . Dementia Mother   . Heart disease Father   . Dementia Father   . Diabetes Father   . Hypertension Brother     History   Social History  . Marital Status: Married    Spouse Name: N/A    Number of Children: N/A  . Years of Education: N/A   Social History Main Topics  . Smoking status: Never Smoker   . Smokeless tobacco: None  . Alcohol Use:   . Drug Use:   . Sexually Active:    Other Topics Concern  . 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 levothyroxine (SYNTHROID, LEVOTHROID) 137 MCG tablet TAKE 1 TABLET EACH DAY.  30 tablet  2  . Omega-3 Fatty Acids (FISH OIL PO) Take by mouth.      Marland Kitchen lisinopril (PRINIVIL,ZESTRIL) 10 MG tablet Take 1 tablet (10 mg total) by mouth daily. Can increase to 20 mg per day for high blood pressure .  90 tablet  3  . [DISCONTINUED] levofloxacin (LEVAQUIN) 500 MG tablet Take 1 tablet (500 mg total) by mouth daily.  10 tablet  0  . [DISCONTINUED] predniSONE (DELTASONE) 20 MG tablet One tab twice daily for 4 days, then 1/2 tab twice daily for 4 days, then 1/2 tab daily for 4 days  14 tablet  0    EXAM:  BP 158/80  Pulse 91  Temp 98.6 F (37 C) (Oral)  Wt 161 lb (73.029 kg)  SpO2 97%  There is no height on file to calculate BMI.  GENERAL: vitals reviewed and listed above, alert, oriented, appears well hydrated and in no acute distress  HEENT: atraumatic, conjunctiva  clear, no obvious  abnormalities on inspection of external nose and ears TMs clear OP : no lesion edema or exudate  NECK: no obvious masses on inspection palpation no adenopathy and no bruit supple LUNGS: clear to auscultation bilaterally, no wheezes, rales or rhonchi, good air movement CV: HRRR, S1-S2 no clubbing cyanosis or  peripheral edema nl cap refill  Abdomen soft without hepatomegaly guarding or rebound MS: moves all extremities without noticeable focal  abnormality PSYCH: pleasant and cooperative, no obvious depression or anxiety Neurologic is grossly nonfocal Lab Results  Component Value Date   TSH 0.72 05/19/2012   ASSESSMENT AND PLAN:  Discussed the following assessment and plan:  1. HYPERTENSION    Does not think she has persistent intolerance to ACE had cough related to respiratory infection prolonged. Reasonable to retry ACEI  2. HYPERGLYCEMIA,  FASTING    check at next labs   3. Hypothyroidism    stable on replacement    -Patient advised to return or notify health care team  immediately if symptoms worsen or persist or new concerns arise.  Patient Instructions  Add lisinopril  Increase as directed  . Contact if gets cough side effects. ROV in 2 months and get labs pre visit.  Can sign up fr Mychart   Burna Mortimer K. Panosh M.D.

## 2012-11-28 NOTE — Patient Instructions (Signed)
Add lisinopril  Increase as directed  . Contact if gets cough side effects. ROV in 2 months and get labs pre visit.  Can sign up fr Mychart

## 2012-12-17 ENCOUNTER — Encounter: Payer: Self-pay | Admitting: Internal Medicine

## 2012-12-18 NOTE — Telephone Encounter (Signed)
See message   Ask if she thinks she will get  Sx with cozaar  Because she had problems with benecar .   If she  thibks wont give SE and wants to try ;we can rx cozaar 50  Mg per day.  Otherwise  We can try  Amlodipine  5 mg ( or  Even Diuretic )  See  Her e mail also in the message .

## 2012-12-19 MED ORDER — AMLODIPINE BESYLATE 5 MG PO TABS: 5.0000 mg | ORAL_TABLET | Freq: Every day | ORAL | Status: DC

## 2012-12-19 NOTE — Telephone Encounter (Signed)
Spoke with patient and Rx for Amlodipine has been sent to [facility].

## 2012-12-19 NOTE — Telephone Encounter (Signed)
Patient is willing to try amlodipine.  Rx sent to pharmacy

## 2012-12-27 ENCOUNTER — Other Ambulatory Visit: Payer: Self-pay

## 2013-01-27 ENCOUNTER — Other Ambulatory Visit: Payer: Medicare Other

## 2013-01-27 ENCOUNTER — Ambulatory Visit: Payer: Medicare Other | Admitting: Internal Medicine

## 2013-02-02 ENCOUNTER — Other Ambulatory Visit (INDEPENDENT_AMBULATORY_CARE_PROVIDER_SITE_OTHER): Payer: Medicare Other

## 2013-02-02 ENCOUNTER — Other Ambulatory Visit: Payer: Self-pay | Admitting: Family Medicine

## 2013-02-02 DIAGNOSIS — R7309 Other abnormal glucose: Secondary | ICD-10-CM | POA: Diagnosis not present

## 2013-02-02 DIAGNOSIS — E079 Disorder of thyroid, unspecified: Secondary | ICD-10-CM

## 2013-02-02 DIAGNOSIS — R739 Hyperglycemia, unspecified: Secondary | ICD-10-CM

## 2013-02-02 LAB — HEMOGLOBIN A1C: Hgb A1c MFr Bld: 6.7 % — ABNORMAL HIGH (ref 4.6–6.5)

## 2013-02-02 LAB — TSH: TSH: 0.07 u[IU]/mL — ABNORMAL LOW (ref 0.35–5.50)

## 2013-02-03 ENCOUNTER — Ambulatory Visit: Payer: Medicare Other | Admitting: Internal Medicine

## 2013-02-05 ENCOUNTER — Other Ambulatory Visit: Payer: Medicare Other

## 2013-02-10 ENCOUNTER — Ambulatory Visit (INDEPENDENT_AMBULATORY_CARE_PROVIDER_SITE_OTHER): Payer: Medicare Other | Admitting: Internal Medicine

## 2013-02-10 ENCOUNTER — Encounter: Payer: Self-pay | Admitting: Internal Medicine

## 2013-02-10 VITALS — BP 140/80 | HR 73 | Temp 98.6°F | Wt 162.0 lb

## 2013-02-10 DIAGNOSIS — I10 Essential (primary) hypertension: Secondary | ICD-10-CM

## 2013-02-10 DIAGNOSIS — R7309 Other abnormal glucose: Secondary | ICD-10-CM

## 2013-02-10 DIAGNOSIS — E039 Hypothyroidism, unspecified: Secondary | ICD-10-CM

## 2013-02-10 DIAGNOSIS — R7303 Prediabetes: Secondary | ICD-10-CM

## 2013-02-10 MED ORDER — METFORMIN HCL ER 500 MG PO TB24: 500.0000 mg | ORAL_TABLET | Freq: Every day | ORAL | Status: DC

## 2013-02-10 MED ORDER — LEVOTHYROXINE SODIUM 125 MCG PO TABS: ORAL_TABLET | ORAL | Status: DC

## 2013-02-10 MED ORDER — AMLODIPINE BESYLATE 5 MG PO TABS: 5.0000 mg | ORAL_TABLET | Freq: Every day | ORAL | Status: DC

## 2013-02-10 NOTE — Progress Notes (Signed)
Chief Complaint  Patient presents with  . Follow-up    HPI: Patient comes in today for follow up of  multiple medical problems.  Since last visit we have changed her blood pressure medication because she did develop a cough on the ACE inhibitor. It was switched to amlodipine doing okay on this is why she takes it in the evening to avoid drowsiness. Blood pressure readings are acceptable at school when she takes them Thyroid;   On generic and now taking at night . Without food may have changed absorption. Feels fine. Been exercising as much pain as much attention to dietBrother died last   summer .   No diabetes symptoms I changes remote history of gestational diabetes ROS: See pertinent positives and negatives per HPI.  Past Medical History  Diagnosis Date  . Hyperlipidemia   . Hypertension   . Hypothyroidism   . Gestational diabetes   . Carotidynia   . Syncope 2011    reaction to benicar    Family History  Problem Relation Age of Onset  . Hypertension Mother   . Dementia Mother   . Heart disease Father   . Dementia Father   . Diabetes Father   . Hypertension Brother     History   Social History  . Marital Status: Married    Spouse Name: N/A    Number of Children: N/A  . Years of Education: N/A   Social History Main Topics  . Smoking status: Never Smoker   . Smokeless tobacco: None  . Alcohol Use:   . Drug Use:   . Sexually Active:    Other Topics Concern  . 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 amLODipine (NORVASC) 5 MG tablet Take 1 tablet (5 mg total) by mouth daily.  90 tablet  3  . levothyroxine (SYNTHROID, LEVOTHROID) 125 MCG tablet TAKE 1 TABLET EACH DAY.  90 tablet  3  . Omega-3 Fatty Acids (FISH OIL PO) Take by mouth.      . [DISCONTINUED] amLODipine (NORVASC) 5 MG tablet  Take 1 tablet (5 mg total) by mouth daily.  30 tablet  3  . [DISCONTINUED] levothyroxine (SYNTHROID, LEVOTHROID) 137 MCG tablet TAKE 1 TABLET EACH DAY.  30 tablet  2  . metFORMIN (GLUCOPHAGE-XR) 500 MG 24 hr tablet Take 1 tablet (500 mg total) by mouth daily with breakfast.  30 tablet  5   No facility-administered encounter medications on file as of 02/10/2013.    EXAM:  BP 140/80  Pulse 73  Temp(Src) 98.6 F (37 C) (Oral)  Wt 162 lb (73.483 kg)  BMI 26.54 kg/m2  SpO2 97%  Body mass index is 26.54 kg/(m^2).  GENERAL: vitals reviewed and listed above, alert, oriented, appears well hydrated and in no acute distress   PSYCH: pleasant and cooperative, no obvious depression or anxiety Lab Results  Component Value Date   HGBA1C 6.7* 02/02/2013   Lab Results  Component Value Date   TSH 0.07* 02/02/2013    ASSESSMENT AND PLAN:  Discussed the following assessment and plan:  HYPERTENSION  Hypothyroidism - suppressed tsh prob from taking alone and consistently hx dec to 125  check tsh 3 months  Pre-diabetes - early daibetic a1c  add diaaly metformin 500 and follow check  3 months lab only  -Patient advised to return or notify health care team  if symptoms worsen or persist or new concerns arise.  Patient Instructions  Decrease  To .125  Levothyroxne.   Continue amlodipine 5 mg   Can add  Metformin 500 er 1 per  Day.     Neta Mends. Julee Stoll M.D.

## 2013-02-10 NOTE — Patient Instructions (Signed)
Decrease  To .125  Levothyroxne.   Continue amlodipine 5 mg   Can add  Metformin 500 er 1 per  Day.

## 2013-03-19 ENCOUNTER — Encounter: Payer: Self-pay | Admitting: Family Medicine

## 2013-03-19 ENCOUNTER — Telehealth: Payer: Self-pay | Admitting: Internal Medicine

## 2013-03-19 ENCOUNTER — Ambulatory Visit (INDEPENDENT_AMBULATORY_CARE_PROVIDER_SITE_OTHER): Payer: Medicare Other | Admitting: Family Medicine

## 2013-03-19 VITALS — BP 122/68 | Temp 98.3°F

## 2013-03-19 DIAGNOSIS — N390 Urinary tract infection, site not specified: Secondary | ICD-10-CM | POA: Diagnosis not present

## 2013-03-19 DIAGNOSIS — R300 Dysuria: Secondary | ICD-10-CM

## 2013-03-19 LAB — POCT URINALYSIS DIPSTICK
Bilirubin, UA: NEGATIVE
Nitrite, UA: NEGATIVE
Urobilinogen, UA: 0.2
pH, UA: 6

## 2013-03-19 MED ORDER — CIPROFLOXACIN HCL 500 MG PO TABS: 500.0000 mg | ORAL_TABLET | Freq: Two times a day (BID) | ORAL | Status: DC

## 2013-03-19 NOTE — Progress Notes (Signed)
  Subjective:    Patient ID: Valerie Brady, female    DOB: [birth year], 67 y.o.   MRN: [id number]  HPI Acute visit One-day history of urine frequency and mild burning with urination. Mild suprapubic discomfort. Generally has some mild malaise and mild achiness. No fever or chills. No nausea or vomiting.  Patient 6 weeks ago had very similar symptoms. Had leftover Cipro which she took for 3 days which fully relieved her symptoms. Generally very healthy. She has hypertension treated with amlodipine. She's on thyroid replacement. She has history prediabetes.   Review of Systems  Constitutional: Negative for fever, chills and appetite change.  Gastrointestinal: Negative for nausea, vomiting, abdominal pain, diarrhea and constipation.  Genitourinary: Positive for dysuria and frequency.  Musculoskeletal: Negative for back pain.  Neurological: Negative for dizziness.       Objective:   Physical Exam  Constitutional: She appears well-developed and well-nourished.  Cardiovascular: Normal rate and regular rhythm.   Pulmonary/Chest: Effort normal and breath sounds normal. No respiratory distress. She has no wheezes. She has no rales.  Musculoskeletal:  No flank tenderness          Assessment & Plan:  Probable uncomplicated cystitis. Ciprofloxacin 100 mg twice a day for 3 days

## 2013-03-19 NOTE — Patient Instructions (Addendum)
Urinary Tract Infection Urinary tract infections (UTIs) can develop anywhere along your urinary tract. Your urinary tract is your body's drainage system for removing wastes and extra water. Your urinary tract includes two kidneys, two ureters, a bladder, and a urethra. Your kidneys are a pair of bean-shaped organs. Each kidney is about the size of your fist. They are located below your ribs, one on each side of your spine. CAUSES Infections are caused by microbes, which are microscopic organisms, including fungi, viruses, and bacteria. These organisms are so small that they can only be seen through a microscope. Bacteria are the microbes that most commonly cause UTIs. SYMPTOMS  Symptoms of UTIs may vary by age and gender of the patient and by the location of the infection. Symptoms in young women typically include a frequent and intense urge to urinate and a painful, burning feeling in the bladder or urethra during urination. Older women and men are more likely to be tired, shaky, and weak and have muscle aches and abdominal pain. A fever may mean the infection is in your kidneys. Other symptoms of a kidney infection include pain in your back or sides below the ribs, nausea, and vomiting. DIAGNOSIS To diagnose a UTI, your caregiver will ask you about your symptoms. Your caregiver also will ask to provide a urine sample. The urine sample will be tested for bacteria and white blood cells. White blood cells are made by your body to help fight infection. TREATMENT  Typically, UTIs can be treated with medication. Because most UTIs are caused by a bacterial infection, they usually can be treated with the use of antibiotics. The choice of antibiotic and length of treatment depend on your symptoms and the type of bacteria causing your infection. HOME CARE INSTRUCTIONS  If you were prescribed antibiotics, take them exactly as your caregiver instructs you. Finish the medication even if you feel better after you  have only taken some of the medication.  Drink enough water and fluids to keep your urine clear or pale yellow.  Avoid caffeine, tea, and carbonated beverages. They tend to irritate your bladder.  Empty your bladder often. Avoid holding urine for long periods of time.  Empty your bladder before and after sexual intercourse.  After a bowel movement, women should cleanse from front to back. Use each tissue only once. SEEK MEDICAL CARE IF:   You have back pain.  You develop a fever.  Your symptoms do not begin to resolve within 3 days. SEEK IMMEDIATE MEDICAL CARE IF:   You have severe back pain or lower abdominal pain.  You develop chills.  You have nausea or vomiting.  You have continued burning or discomfort with urination. MAKE SURE YOU:   Understand these instructions.  Will watch your condition.  Will get help right away if you are not doing well or get worse. Document Released: 08/08/2005 Document Revised: 04/29/2012 Document Reviewed: 12/07/2011 [employer] Patient Information 2013 [employer].  

## 2013-03-19 NOTE — Telephone Encounter (Signed)
Patient Information:  Caller Name: Valerie Brady  Phone: [phone]  Patient: Valerie Brady, Valerie Brady  Gender: Female  DOB: [birth year]  Age: 67 Years  PCP: Berniece Andreas (Family Practice)  Office Follow Up:  Does the office need to follow up with this patient?: No  Instructions For The Office: N/A  RN Note:  Patient calling about new onset urgency, burning, and frequency.  Afebrile.  Denies blood in urine or emergent symptom .  Per urination pain protocol, advised appt today; appt scheduled 1600 03/19/13 with Dr. Caryl Never.  krs/can  Symptoms  Reason For Call & Symptoms: urinary symptoms  Reviewed Health History In EMR: Yes  Reviewed Medications In EMR: Yes  Reviewed Allergies In EMR: Yes  Reviewed Surgeries / Procedures: Yes  Date of Onset of Symptoms: 03/19/2013  Guideline(s) Used:  Urination Pain - Female  Disposition Per Guideline:   See Today in Office  Reason For Disposition Reached:   Age > 50 years  Advice Given:  N/A  Patient Will Follow Care Advice:  YES  Appointment Scheduled:  03/19/2013 16:00:00 Appointment Scheduled Provider:  Evelena Peat (Family Practice)

## 2013-05-06 ENCOUNTER — Other Ambulatory Visit (INDEPENDENT_AMBULATORY_CARE_PROVIDER_SITE_OTHER): Payer: Medicare Other

## 2013-05-06 ENCOUNTER — Telehealth: Payer: Self-pay | Admitting: Internal Medicine

## 2013-05-06 ENCOUNTER — Ambulatory Visit (INDEPENDENT_AMBULATORY_CARE_PROVIDER_SITE_OTHER): Payer: Medicare Other | Admitting: Family Medicine

## 2013-05-06 ENCOUNTER — Encounter: Payer: Self-pay | Admitting: Family Medicine

## 2013-05-06 VITALS — BP 158/84 | HR 81 | Temp 98.1°F | Wt 158.0 lb

## 2013-05-06 DIAGNOSIS — E039 Hypothyroidism, unspecified: Secondary | ICD-10-CM | POA: Diagnosis not present

## 2013-05-06 DIAGNOSIS — R7303 Prediabetes: Secondary | ICD-10-CM

## 2013-05-06 DIAGNOSIS — R300 Dysuria: Secondary | ICD-10-CM

## 2013-05-06 DIAGNOSIS — R7309 Other abnormal glucose: Secondary | ICD-10-CM

## 2013-05-06 DIAGNOSIS — N390 Urinary tract infection, site not specified: Secondary | ICD-10-CM

## 2013-05-06 LAB — POCT URINALYSIS DIPSTICK
Glucose, UA: NEGATIVE
Nitrite, UA: NEGATIVE
Protein, UA: NEGATIVE
Urobilinogen, UA: 0.2
pH, UA: 5.5

## 2013-05-06 LAB — TSH: TSH: 0.1 u[IU]/mL — ABNORMAL LOW (ref 0.35–5.50)

## 2013-05-06 MED ORDER — SULFAMETHOXAZOLE-TRIMETHOPRIM 800-160 MG PO TABS: 1.0000 | ORAL_TABLET | Freq: Two times a day (BID) | ORAL | Status: DC

## 2013-05-06 NOTE — Progress Notes (Signed)
  Subjective:    Patient ID: Valerie Brady, female    DOB: [birth year], 67 y.o.   MRN: [id number]  HPI Here for several days of urinary burning and urgency. No fever or nausea. Drinking plenty of water. She has taken Cipro twice in the past 6 weeks for UTI symptoms. Each time it seemed to help for awhile. No culture has been done lately.    Review of Systems  Constitutional: Negative.   Gastrointestinal: Negative.   Genitourinary: Positive for dysuria, urgency and frequency.       Objective:   Physical Exam  Constitutional: She appears well-developed and well-nourished.  Abdominal: Soft. Bowel sounds are normal. She exhibits no distension and no mass. There is no tenderness. There is no rebound and no guarding.          Assessment & Plan:  Recurrent UTI. Given Bactrim DS for 7 days. Culture the urine.

## 2013-05-06 NOTE — Telephone Encounter (Signed)
Patient Information:  Caller Name: Megon  Phone: [phone]  Patient: Valerie Brady, Valerie Brady  Gender: Female  DOB: 23-Oct-1946  Age: 67 Years  PCP: Berniece Andreas [facility])  Office Follow Up:  Does the office need to follow up with this patient?: No  Instructions For The Office: N/A  RN Note:  Previous UTI 03/19/13.  Suggested otc urinary analgesic for dysuria. Reports stopped taking Amlodipine due to drowsiness.  Symptoms  Reason For Call & Symptoms: Recurrent urinary tract infection with frequency, nocturia, dysuria and cloudy urine.  Reviewed Health History In EMR: Yes  Reviewed Medications In EMR: Yes  Reviewed Allergies In EMR: Yes  Reviewed Surgeries / Procedures: Yes  Date of Onset of Symptoms: 05/05/2013  Treatments Tried: > water intake  Treatments Tried Worked: No  Guideline(s) Used:  Urination Pain - Female  Disposition Per Guideline:   See Today in Office  Reason For Disposition Reached:   > 2 UTIs in last year  Advice Given:  Fluids:   Drink extra fluids. Drink 8-10 glasses of liquids a day (Reason: to produce a dilute, non-irritating urine).  Warm Saline SITZ Baths to Reduce Pain:  Sit in a warm saline bath for 20 minutes to cleanse the area and to reduce pain. Add 2 oz. of table salt or baking soda to a tub of water.  Call Back If:  You become worse.  Patient Will Follow Care Advice:  YES  Appointment Scheduled:  05/06/2013 15:00:00 Appointment Scheduled Provider:  Gershon Crane [facility])

## 2013-05-07 MED ORDER — LEVOTHYROXINE SODIUM 100 MCG PO TABS: 100.0000 ug | ORAL_TABLET | Freq: Every day | ORAL | Status: DC

## 2013-05-07 NOTE — Progress Notes (Signed)
Quick Note:  I spoke with pt, sent script e-scribe and also released results in my chart. ______

## 2013-05-07 NOTE — Addendum Note (Signed)
Addended by: Aniceto Boss A on: 05/07/2013 09:44 AM   Modules accepted: Orders

## 2013-05-12 ENCOUNTER — Other Ambulatory Visit: Payer: Medicare Other

## 2013-06-01 DIAGNOSIS — N390 Urinary tract infection, site not specified: Secondary | ICD-10-CM | POA: Diagnosis not present

## 2013-06-09 ENCOUNTER — Encounter: Payer: Self-pay | Admitting: Internal Medicine

## 2013-06-09 ENCOUNTER — Ambulatory Visit (INDEPENDENT_AMBULATORY_CARE_PROVIDER_SITE_OTHER): Payer: Medicare Other | Admitting: Internal Medicine

## 2013-06-09 VITALS — BP 140/70 | HR 72 | Temp 98.5°F | Wt 158.0 lb

## 2013-06-09 DIAGNOSIS — R7303 Prediabetes: Secondary | ICD-10-CM

## 2013-06-09 DIAGNOSIS — E039 Hypothyroidism, unspecified: Secondary | ICD-10-CM

## 2013-06-09 DIAGNOSIS — I10 Essential (primary) hypertension: Secondary | ICD-10-CM | POA: Diagnosis not present

## 2013-06-09 DIAGNOSIS — Z87440 Personal history of urinary (tract) infections: Secondary | ICD-10-CM

## 2013-06-09 DIAGNOSIS — R7309 Other abnormal glucose: Secondary | ICD-10-CM

## 2013-06-09 DIAGNOSIS — Z23 Encounter for immunization: Secondary | ICD-10-CM

## 2013-06-09 NOTE — Progress Notes (Signed)
Chief Complaint  Patient presents with  . Follow-up    HPI:  Patient comes in today for followup of medical management couple of issues. Blood pressure readings have been op and we placed her on amlodipine she stopped it because of side effects she feels Work related HT  Off meds for now    Watching bp for now.   When went to urgent care for UTI was 128 when she is away from work it is normal  Just returned from vacation and is now on reduced hours and will stop work by October. 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   Feels that the stress of work intensity computer work was getting migraines with aura and vision changes. Stopped the amlodipine and then sleep difficulties . Felt to be taking the levothyroxine at night. She did better when it was in the morning but the issues were interfering medicines and foods. She had been on 137 for a number of years but at the last visit her TSH was over suppressed. In addition she's had 2 UTIs treated last one with Septra is going to try mannose.  Trial for recurrnet uti .     ROS: See pertinent positives and negatives per HPI. Family history of type 2 diabetes. Parents sibling Past Medical History  Diagnosis Date  . Hyperlipidemia   . Hypertension   . Hypothyroidism   . Gestational diabetes   . Carotidynia   . Syncope 2011    reaction to benicar    Family History  Problem Relation Age of Onset  . Hypertension Mother   . Dementia Mother   . Heart disease Father   . Dementia Father   . Diabetes Father   . Hypertension Brother     History   Social History  . Marital Status: Married    Spouse Name: N/A    Number of Children: N/A  . Years of Education: N/A   Social History Main Topics  . Smoking status: Never Smoker   . Smokeless tobacco: Never Used  . Alcohol Use: Yes     Comment: glass of wine each day  . Drug Use: No  . Sexually Active: None   Other Topics Concern  . 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 D-MANNOSE PO Take 2 g by mouth daily.      Marland Kitchen levothyroxine (SYNTHROID, LEVOTHROID) 100 MCG tablet Take 1 tablet (100 mcg total) by mouth daily.  90 tablet  0  . Melatonin 5 MG TABS Take 5 mg by mouth at bedtime.      . metFORMIN (GLUCOPHAGE-XR) 500 MG 24 hr tablet Take 1 tablet (500 mg total) by mouth daily with breakfast.  30 tablet  5  . [DISCONTINUED] amLODipine (NORVASC) 5 MG tablet Take 1 tablet (5 mg total) by mouth daily.  90 tablet  3  . [DISCONTINUED] ciprofloxacin (CIPRO) 500 MG tablet Take 1 tablet (500 mg total) by mouth 2 (two) times daily.  6 tablet  0  . [DISCONTINUED] levothyroxine (SYNTHROID, LEVOTHROID) 125 MCG tablet TAKE 1 TABLET EACH DAY.  90 tablet  3  . [DISCONTINUED] Omega-3 Fatty Acids (FISH OIL PO) Take by mouth.      . [DISCONTINUED] sulfamethoxazole-trimethoprim (  BACTRIM DS,SEPTRA DS) 800-160 MG per tablet Take 1 tablet by mouth 2 (two) times daily.  14 tablet  0   No facility-administered encounter medications on file as of 06/09/2013.    EXAM:  BP 140/70  Pulse 72  Temp(Src) 98.5 F (36.9 C) (Oral)  Wt 158 lb (71.668 kg)  BMI 25.88 kg/m2  SpO2 96%  Body mass index is 25.88 kg/(m^2).  GENERAL: vitals reviewed and listed above, alert, oriented, appears well hydrated and in no acute distress  PSYCH: pleasant and cooperative, no obvious depression or anxiety Lab Results  Component Value Date   WBC 8.6 05/19/2012   HGB 13.8 05/19/2012   HCT 41.4 05/19/2012   PLT 347.0 05/19/2012   GLUCOSE 112* 05/19/2012   CHOL 235* 01/10/2011   TRIG 171.0* 01/10/2011   HDL 52.00 01/10/2011   LDLDIRECT 167.6 01/10/2011   LDLCALC 192 02/03/2009   ALT 15 01/10/2011   AST 17 01/10/2011   NA 139 05/19/2012   K 3.9 05/19/2012   CL 105 05/19/2012   CREATININE 0.8 05/19/2012   BUN 18 05/19/2012   CO2  24 05/19/2012   TSH 0.10* 05/06/2013   HGBA1C 6.8* 05/06/2013    ASSESSMENT AND PLAN:  Discussed the following assessment and plan:  Hypothyroidism  Need for prophylactic vaccination against Streptococcus pneumoniae (pneumococcus) - Plan: Pneumococcal polysaccharide vaccine 23-valent greater than or equal to 2yo subcutaneous/IM  Pre-diabetes  HYPERTENSION - Okay to remain off medication monitor appropriately lifestyle etc. try other medication intervention if appropriate  History of recurrent UTIs - Reasonable to try non-antibiotic measures Actually may need the criteria for diabetes continue the metformin doing well on this In regard to her thyroid replacement  continue on 100 mcg in the morning at the bedside and repeat the TSH in 4-6 weeks.  Decision then if need to increase replacement dose. Reviewed immunizations. -Patient advised to return or notify health care team  if symptoms worsen or persist or new concerns arise.  Patient Instructions  Ok to stay on  100 mcg   Levothyroxine  In the am at beside  Check tsh   In   6 weeks    And decide on dosing change then. BP monitoring .   Increase activity  . Etc .  pneumovax today  Td when needed  check on shingles vaccine     Neta Mends. Blen Ransome M.D.

## 2013-06-09 NOTE — Patient Instructions (Addendum)
Ok to stay on  100 mcg   Levothyroxine  In the am at beside  Check tsh   In   6 weeks    And decide on dosing change then. BP monitoring .   Increase activity  . Etc .  pneumovax today  Td when needed  check on shingles vaccine

## 2013-06-10 DIAGNOSIS — Z87440 Personal history of urinary (tract) infections: Secondary | ICD-10-CM | POA: Insufficient documentation

## 2013-07-21 ENCOUNTER — Other Ambulatory Visit (INDEPENDENT_AMBULATORY_CARE_PROVIDER_SITE_OTHER): Payer: Medicare Other

## 2013-07-21 DIAGNOSIS — E039 Hypothyroidism, unspecified: Secondary | ICD-10-CM | POA: Diagnosis not present

## 2013-07-21 LAB — TSH: TSH: 2.83 u[IU]/mL (ref 0.35–5.50)

## 2013-07-23 ENCOUNTER — Encounter: Payer: Self-pay | Admitting: Family Medicine

## 2013-08-15 ENCOUNTER — Other Ambulatory Visit: Payer: Self-pay | Admitting: Family Medicine

## 2013-08-15 ENCOUNTER — Other Ambulatory Visit: Payer: Self-pay | Admitting: Internal Medicine

## 2013-08-17 ENCOUNTER — Encounter: Payer: Self-pay | Admitting: Internal Medicine

## 2013-08-17 ENCOUNTER — Ambulatory Visit (INDEPENDENT_AMBULATORY_CARE_PROVIDER_SITE_OTHER): Payer: Medicare Other | Admitting: Internal Medicine

## 2013-08-17 VITALS — BP 126/82 | HR 72 | Temp 98.0°F | Wt 159.0 lb

## 2013-08-17 DIAGNOSIS — R7309 Other abnormal glucose: Secondary | ICD-10-CM

## 2013-08-17 DIAGNOSIS — R7303 Prediabetes: Secondary | ICD-10-CM

## 2013-08-17 DIAGNOSIS — N390 Urinary tract infection, site not specified: Secondary | ICD-10-CM

## 2013-08-17 DIAGNOSIS — R300 Dysuria: Secondary | ICD-10-CM | POA: Diagnosis not present

## 2013-08-17 DIAGNOSIS — Z87440 Personal history of urinary (tract) infections: Secondary | ICD-10-CM

## 2013-08-17 LAB — POCT URINALYSIS DIPSTICK
Bilirubin, UA: NEGATIVE
Ketones, UA: NEGATIVE
Nitrite, UA: POSITIVE
Protein, UA: NEGATIVE

## 2013-08-17 MED ORDER — SULFAMETHOXAZOLE-TRIMETHOPRIM 800-160 MG PO TABS: 1.0000 | ORAL_TABLET | Freq: Two times a day (BID) | ORAL | Status: DC

## 2013-08-17 NOTE — Progress Notes (Signed)
Chief Complaint  Patient presents with  . Dysuria  . Urinary Frequency    HPI: Patient comes in today for SDA for  new problem evaluation. Had been on d mannose    2 days of sx and no fever  With this.  Hx of pan sensitive e coli last cx was in June ROS: See pertinent positives and negatives per HPI.  Past Medical History  Diagnosis Date  . Hyperlipidemia   . Hypertension   . Hypothyroidism   . Gestational diabetes   . Carotidynia   . Syncope 2011    reaction to benicar    Family History  Problem Relation Age of Onset  . Hypertension Mother   . Dementia Mother   . Heart disease Father   . Dementia Father   . Diabetes Father   . Hypertension Brother     History   Social History  . Marital Status: Married    Spouse Name: N/A    Number of Children: N/A  . Years of Education: N/A   Social History Main Topics  . Smoking status: Never Smoker   . Smokeless tobacco: Never Used  . Alcohol Use: Yes     Comment: glass of wine each day  . Drug Use: No  . Sexual Activity: None   Other Topics Concern  . 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 D-MANNOSE PO Take 2 g by mouth daily.      . Melatonin 5 MG TABS Take 5 mg by mouth at bedtime.      . metFORMIN (GLUCOPHAGE-XR) 500 MG 24 hr tablet TAKE 1 TABLET EVERY DAY WITH BREAKFAST.  30 tablet  3  . [DISCONTINUED] levothyroxine (SYNTHROID, LEVOTHROID) 100 MCG tablet Take 1 tablet (100 mcg total) by mouth daily.  90 tablet  0  . sulfamethoxazole-trimethoprim (SEPTRA DS) 800-160 MG per tablet Take 1 tablet by mouth 2 (two) times daily.  12 tablet  0   No facility-administered encounter medications on file as of 08/17/2013.    EXAM:  BP 126/82  Pulse 72  Temp(Src) 98 F (36.7 C) (Oral)  Wt 159 lb (72.122 kg)  BMI 26.05 kg/m2  SpO2  96%  Body mass index is 26.05 kg/(m^2).  GENERAL: vitals reviewed and listed above, alert, oriented, appears well hydrated and in no acute distress   PSYCH: pleasant and cooperative, no obvious depression or anxiety  ASSESSMENT AND PLAN:  Discussed the following assessment and plan:  Dysuria - Plan: POC Urinalysis Dipstick  UTI (urinary tract infection) - Plan: Urine culture  Pre-diabetes  History of recurrent UTIs esrtrace samples given to try  Enough med for 2 rx given  12 pills if need 2 rx when travels  Contact insuance bout BG  Monitoring  -Patient advised to return or notify health care team  if symptoms worsen or persist or new concerns arise.  Patient Instructions  Consider  etsrogen cream  rx .   For  UTI sx.   Antibiotic as needed.    Neta Mends. Nana Vastine M.D.

## 2013-08-17 NOTE — Patient Instructions (Signed)
Consider  etsrogen cream  rx .   For  UTI sx.   Antibiotic as needed.

## 2013-08-18 ENCOUNTER — Encounter: Payer: Self-pay | Admitting: Internal Medicine

## 2013-08-19 LAB — URINE CULTURE

## 2013-08-20 ENCOUNTER — Other Ambulatory Visit: Payer: Self-pay | Admitting: Family Medicine

## 2013-08-21 NOTE — Progress Notes (Signed)
Quick Note:  Tell patient that urine culture shows e coli sensitive to medication given . Should resolve with current treatment .FU if not better. ______ 

## 2013-08-24 ENCOUNTER — Encounter: Payer: Self-pay | Admitting: Family Medicine

## 2013-09-17 ENCOUNTER — Other Ambulatory Visit: Payer: Self-pay

## 2013-09-30 ENCOUNTER — Other Ambulatory Visit: Payer: Self-pay | Admitting: Family Medicine

## 2013-09-30 MED ORDER — ESTRADIOL 0.1 MG/GM VA CREA: 1.0000 | TOPICAL_CREAM | Freq: Every day | VAGINAL | Status: DC

## 2013-10-23 ENCOUNTER — Other Ambulatory Visit: Payer: Self-pay | Admitting: General Surgery

## 2013-10-23 ENCOUNTER — Ambulatory Visit
Admission: RE | Admit: 2013-10-23 | Discharge: 2013-10-23 | Disposition: A | Discharge status: Home / Self Care | Payer: Medicare Other | Source: Ambulatory Visit | Attending: General Surgery | Admitting: General Surgery

## 2013-10-23 DIAGNOSIS — M25561 Pain in right knee: Secondary | ICD-10-CM

## 2013-10-23 DIAGNOSIS — T1490XA Injury, unspecified, initial encounter: Secondary | ICD-10-CM

## 2013-10-23 DIAGNOSIS — M25569 Pain in unspecified knee: Secondary | ICD-10-CM | POA: Diagnosis not present

## 2013-11-10 DIAGNOSIS — H43819 Vitreous degeneration, unspecified eye: Secondary | ICD-10-CM | POA: Diagnosis not present

## 2013-11-10 DIAGNOSIS — H02839 Dermatochalasis of unspecified eye, unspecified eyelid: Secondary | ICD-10-CM | POA: Diagnosis not present

## 2013-11-10 DIAGNOSIS — H2510 Age-related nuclear cataract, unspecified eye: Secondary | ICD-10-CM | POA: Diagnosis not present

## 2013-11-10 DIAGNOSIS — H40019 Open angle with borderline findings, low risk, unspecified eye: Secondary | ICD-10-CM | POA: Diagnosis not present

## 2013-11-10 DIAGNOSIS — E119 Type 2 diabetes mellitus without complications: Secondary | ICD-10-CM | POA: Diagnosis not present

## 2013-11-10 LAB — HM DIABETES EYE EXAM

## 2013-11-13 ENCOUNTER — Encounter: Payer: Self-pay | Admitting: Internal Medicine

## 2013-11-16 ENCOUNTER — Other Ambulatory Visit: Payer: Self-pay | Admitting: Internal Medicine

## 2014-02-15 ENCOUNTER — Other Ambulatory Visit: Payer: Self-pay | Admitting: Internal Medicine

## 2014-03-05 ENCOUNTER — Encounter: Payer: Self-pay | Admitting: Internal Medicine

## 2014-03-05 ENCOUNTER — Ambulatory Visit (INDEPENDENT_AMBULATORY_CARE_PROVIDER_SITE_OTHER): Payer: Medicare Other | Admitting: Internal Medicine

## 2014-03-05 VITALS — BP 132/80 | HR 67 | Temp 98.9°F | Ht 65.5 in | Wt 159.0 lb

## 2014-03-05 DIAGNOSIS — I10 Essential (primary) hypertension: Secondary | ICD-10-CM | POA: Diagnosis not present

## 2014-03-05 DIAGNOSIS — Z23 Encounter for immunization: Secondary | ICD-10-CM | POA: Diagnosis not present

## 2014-03-05 DIAGNOSIS — Z888 Allergy status to other drugs, medicaments and biological substances status: Secondary | ICD-10-CM

## 2014-03-05 DIAGNOSIS — R7309 Other abnormal glucose: Secondary | ICD-10-CM | POA: Diagnosis not present

## 2014-03-05 DIAGNOSIS — R7303 Prediabetes: Secondary | ICD-10-CM

## 2014-03-05 DIAGNOSIS — E039 Hypothyroidism, unspecified: Secondary | ICD-10-CM | POA: Insufficient documentation

## 2014-03-05 DIAGNOSIS — Z789 Other specified health status: Secondary | ICD-10-CM | POA: Insufficient documentation

## 2014-03-05 LAB — LIPID PANEL
Cholesterol: 253 mg/dL — ABNORMAL HIGH (ref 0–200)
HDL: 45.2 mg/dL (ref >39.00)
LDL Cholesterol: 177 mg/dL — ABNORMAL HIGH (ref 0–99)
Total CHOL/HDL Ratio: 6
Triglycerides: 155 mg/dL — ABNORMAL HIGH (ref 0.0–149.0)
VLDL: 31 mg/dL (ref 0.0–40.0)

## 2014-03-05 LAB — BASIC METABOLIC PANEL
BUN: 12 mg/dL (ref 6–23)
CO2: 23 meq/L (ref 19–32)
Calcium: 9.5 mg/dL (ref 8.4–10.5)
Chloride: 106 mEq/L (ref 96–112)
Creatinine, Ser: 0.8 mg/dL (ref 0.4–1.2)
GFR: 82.08 mL/min (ref >60.00)
GLUCOSE: 110 mg/dL — AB (ref 70–99)
POTASSIUM: 4.6 meq/L (ref 3.5–5.1)
SODIUM: 137 meq/L (ref 135–145)

## 2014-03-05 LAB — HEMOGLOBIN A1C: HEMOGLOBIN A1C: 6.2 % (ref 4.6–6.5)

## 2014-03-05 LAB — TSH: TSH: 3.15 u[IU]/mL (ref 0.35–5.50)

## 2014-03-05 NOTE — Patient Instructions (Signed)
Continue lifestyle intervention healthy eating and exercise . Will notify you  of labs when available. Fu  Depending on labs .

## 2014-03-05 NOTE — Progress Notes (Signed)
Chief Complaint  Patient presents with  . Hypertension    f/u  . Diabetes    f/u    HPI: Valerie Brady  comes in today for follow up of  multiple medical  Issues  BG  fastings controlled and then  Some 180 only taking   50 mg per day and losgin weight  Retired. Doing well no sig se of sitation BH:ALPFX ok.  No change in health status   ROS: See pertinent positives and negatives per HPI. No cv neuro sx.   Past Medical History  Diagnosis Date  . Hyperlipidemia   . Hypertension   . Hypothyroidism   . Gestational diabetes   . Carotidynia   . Syncope 2011    reaction to benicar    Family History  Problem Relation Age of Onset  . Hypertension Mother   . Dementia Mother   . Heart disease Father   . Dementia Father   . Diabetes Father   . Hypertension Brother     History   Social History  . Marital Status: Married    Spouse Name: N/A    Number of Children: N/A  . Years of Education: N/A   Social History Main Topics  . Smoking status: Never Smoker   . Smokeless tobacco: Never Used  . Alcohol Use: Yes     Comment: glass of wine each day  . Drug Use: No  . Sexual Activity: None   Other Topics Concern  . 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 D-MANNOSE PO Take 2 g by mouth daily.  Marland Kitchen estradiol (ESTRACE VAGINAL) 0.1 MG/GM vaginal cream Place 1 Applicatorful vaginally at bedtime. Nightly x 2 wks.  Then 3 times weekly.  Marland Kitchen levothyroxine (SYNTHROID, LEVOTHROID) 100 MCG tablet TAKE 1 TABLET EACH DAY.  . Melatonin 5 MG TABS Take 5 mg by mouth at bedtime.  . metFORMIN (GLUCOPHAGE-XR) 500 MG 24 hr tablet TAKE 1 TABLET EVERY DAY WITH BREAKFAST.  Marland Kitchen PRESCRIPTION MEDICATION Generic Glucometer and test strips.  Lancets if needed.  .  sulfamethoxazole-trimethoprim (SEPTRA DS) 800-160 MG per tablet Take 1 tablet by mouth 2 (two) times daily.    EXAM:  BP 132/80  Pulse 67  Temp(Src) 98.9 F (37.2 C) (Oral)  Ht 5' 5.5" (1.664 m)  Wt 159 lb (72.122 kg)  BMI 26.05 kg/m2  SpO2 97%  Body mass index is 26.05 kg/(m^2). Wt Readings from Last 3 Encounters:  03/05/14 159 lb (72.122 kg)  08/17/13 159 lb (72.122 kg)  06/09/13 158 lb (71.668 kg)    GENERAL: vitals reviewed and listed above, alert, oriented, appears well hydrated and in no acute distress HEENT: atraumatic, conjunctiva  clear, no obvious abnormalities on inspection of external nose and earsNECK: no obvious masses on inspection palpation  Neck no masses  LUNGS: clear to auscultation bilaterally, no wheezes, rales or rhonchi, good air movement CV: HRRR, no clubbing cyanosis or  peripheral edema nl cap refill  Abdomen:  Sof,t normal bowel sounds without hepatosplenomegaly, no guarding rebound or masses no CVA tenderness MS: moves all extremities without noticeable focal  abnormality PSYCH: pleasant and cooperative, no obvious depression or anxiety Lab Results  Component Value Date   WBC 8.6 05/19/2012   HGB 13.8 05/19/2012   HCT 41.4 05/19/2012   PLT 347.0 05/19/2012   GLUCOSE 110* 03/05/2014   CHOL 253* 03/05/2014   TRIG 155.0* 03/05/2014   HDL 45.20 03/05/2014   LDLDIRECT 167.6 01/10/2011   LDLCALC 177* 03/05/2014   ALT 15 01/10/2011   AST 17 01/10/2011   NA 137 03/05/2014   K 4.6 03/05/2014   CL 106 03/05/2014   CREATININE 0.8 03/05/2014   BUN 12 03/05/2014   CO2 23 03/05/2014   TSH 3.15 03/05/2014   HGBA1C 6.2 03/05/2014   BP Readings from Last 3 Encounters:  03/05/14 132/80  08/17/13 126/82  06/09/13 140/70     ASSESSMENT AND PLAN:  Discussed the following assessment and plan:  Pre-diabetes - improved ls recheck a1c  - Plan: Hemoglobin J5T, Basic metabolic panel, Lipid panel, TSH  HYPERTENSION - controlled  - Plan: Hemoglobin S1X, Basic metabolic panel,  Lipid panel, TSH  Need for prophylactic vaccination with tetanus toxoid alone - Plan: Td vaccine preservative free greater than or equal to 7yo IM  Unspecified hypothyroidism - labs   Statin intolerance  -Patient advised to return or notify health care team  if symptoms worsen ,persist or new concerns arise.  Patient Instructions  Continue lifestyle intervention healthy eating and exercise . Will notify you  of labs when available. Fu  Depending on labs .     Standley Brooking. Panosh M.D.

## 2014-03-05 NOTE — Progress Notes (Signed)
Pre visit review using our clinic review tool, if applicable. No additional management support is needed unless otherwise documented below in the visit note. 

## 2014-03-12 ENCOUNTER — Other Ambulatory Visit: Payer: Self-pay | Admitting: Family Medicine

## 2014-03-12 MED ORDER — EZETIMIBE 10 MG PO TABS: 10.0000 mg | ORAL_TABLET | Freq: Every day | ORAL | Status: DC

## 2014-03-22 ENCOUNTER — Ambulatory Visit (INDEPENDENT_AMBULATORY_CARE_PROVIDER_SITE_OTHER): Payer: Medicare Other | Admitting: Family Medicine

## 2014-03-22 VITALS — BP 122/76 | HR 86 | Temp 98.0°F | Resp 17 | Ht 65.0 in | Wt 156.0 lb

## 2014-03-22 DIAGNOSIS — H6120 Impacted cerumen, unspecified ear: Secondary | ICD-10-CM

## 2014-03-22 DIAGNOSIS — H918X9 Other specified hearing loss, unspecified ear: Secondary | ICD-10-CM

## 2014-03-22 NOTE — Progress Notes (Signed)
Chief Complaint:  Chief Complaint  Patient presents with  . Cerumen Impaction  . Nausea    HPI: Valerie Brady is a 68 y.o. female who is here for right ear wax buildup and decrease hearing due to wax She has tried glycerin and water flushes at home but could not get it to disimpact.  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   Past Medical History  Diagnosis Date  . Hyperlipidemia   . Hypertension   . Hypothyroidism   . Gestational diabetes   . Carotidynia   . Syncope 2011    reaction to benicar   Past Surgical History  Procedure Laterality Date  . Tubal ligation    . Tonsillectomy    . Fibula fracture surgery      OPEN REDUCTION AND PINNING OF SPIRAL FX LEFT FIBULA  . Cervical polyp removed     History   Social History  . Marital Status: Married    Spouse Name: N/A    Number of Children: N/A  . Years of Education: N/A   Social History Main Topics  . Smoking status: Never Smoker   . Smokeless tobacco: Never Used  . Alcohol Use: Yes     Comment: glass of wine each day  . Drug Use: No  . Sexual Activity: No   Other Topics Concern  . 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 Hypertension Mother   . Dementia Mother   . Heart disease Father   . Dementia Father   . Diabetes Father   . Hypertension Brother    Allergies  Allergen Reactions  . Enalapril Maleate     REACTION: Cough  . Olmesartan Medoxomil     REACTION: Gas and chest pain   Prior to Admission medications   Medication Sig Start Date End Date Taking? Authorizing Provider  D-MANNOSE PO Take 2 g by mouth daily.   Yes Historical Provider, MD  estradiol (ESTRACE VAGINAL) 0.1 MG/GM  vaginal cream Place 1 Applicatorful vaginally at bedtime. Nightly x 2 wks.  Then 3 times weekly. 09/30/13  Yes Burnis Medin, MD  ezetimibe (ZETIA) 10 MG tablet Take 1 tablet (10 mg total) by mouth daily. 03/12/14  Yes Burnis Medin, MD  levothyroxine (SYNTHROID, LEVOTHROID) 100 MCG tablet TAKE 1 TABLET EACH DAY.   Yes Burnis Medin, MD  Melatonin 5 MG TABS Take 5 mg by mouth at bedtime.   Yes Historical Provider, MD  metFORMIN (GLUCOPHAGE-XR) 500 MG 24 hr tablet TAKE 1 TABLET EVERY DAY WITH BREAKFAST.   Yes Burnis Medin, MD  PRESCRIPTION MEDICATION Generic Glucometer and test strips.  Lancets if needed.   Yes Historical Provider, MD     ROS: The patient denies fevers, chills, night sweats, unintentional weight loss, chest pain, palpitations, wheezing, dyspnea on exertion, nausea, vomiting, abdominal pain, dysuria, hematuria, melena, numbness, weakness, or tingling.  All other systems have been reviewed and were otherwise negative with the exception of those mentioned in the HPI and as above.    PHYSICAL EXAM: Filed  Vitals:   03/22/14 1131  BP: 122/76  Pulse: 86  Temp: 98 F (36.7 C)  Resp: 17   Filed Vitals:   03/22/14 1131  Height: 5\' 5"  (1.651 m)  Weight: 156 lb (70.761 kg)   Body mass index is 25.96 kg/(m^2).  General: Alert, no acute distress HEENT:  Normocephalic, atraumatic, oropharynx patent. EOMI, PERRLA, TM impacted with cerumen bilaterally Cardiovascular:  Regular rate and rhythm, no rubs murmurs or gallops.  No Carotid bruits, radial pulse intact. No pedal edema.  Respiratory: Clear to auscultation bilaterally.  No wheezes, rales, or rhonchi.  No cyanosis, no use of accessory musculature GI: No organomegaly, abdomen is soft and non-tender, positive bowel sounds.  No masses. Skin: No rashes. Neurologic: Facial musculature symmetric. Psychiatric: Patient is appropriate throughout our interaction. Lymphatic: No cervical lymphadenopathy Musculoskeletal: Gait  intact.   LABS: Results for orders placed in visit on 03/05/14  HEMOGLOBIN A1C      Result Value Ref Range   Hemoglobin A1C 6.2  4.6 - 6.5 %  BASIC METABOLIC PANEL      Result Value Ref Range   Sodium 137  135 - 145 mEq/L   Potassium 4.6  3.5 - 5.1 mEq/L   Chloride 106  96 - 112 mEq/L   CO2 23  19 - 32 mEq/L   Glucose, Bld 110 (*) 70 - 99 mg/dL   BUN 12  6 - 23 mg/dL   Creatinine, Ser 0.8  0.4 - 1.2 mg/dL   Calcium 9.5  8.4 - 10.5 mg/dL   GFR 82.08  >60.00 mL/min  LIPID PANEL      Result Value Ref Range   Cholesterol 253 (*) 0 - 200 mg/dL   Triglycerides 155.0 (*) 0.0 - 149.0 mg/dL   HDL 45.20  >39.00 mg/dL   VLDL 31.0  0.0 - 40.0 mg/dL   LDL Cholesterol 177 (*) 0 - 99 mg/dL   Total CHOL/HDL Ratio 6    TSH      Result Value Ref Range   TSH 3.15  0.35 - 5.50 uIU/mL     EKG/XRAY:   Primary read interpreted by Dr. Marin Comment at [facility]   ASSESSMENT/PLAN: Encounter Diagnoses  Name Primary?  . Cerumen impaction Yes  . Hearing loss secondary to cerumen impaction    Disimpaction was successful after colace and warm water flush There was mild irritation of the ext canal of the left ear per CMA who did th eflushing F/u prn  Gross sideeffects, risk and benefits, and alternatives of medications d/w patient. Patient is aware that all medications have potential sideeffects and we are unable to predict every sideeffect or drug-drug interaction that may occur.  Glenford Bayley, DO 03/22/2014 12:28 PM

## 2014-03-26 DIAGNOSIS — Z1231 Encounter for screening mammogram for malignant neoplasm of breast: Secondary | ICD-10-CM | POA: Diagnosis not present

## 2014-03-26 LAB — HM MAMMOGRAPHY

## 2014-04-08 ENCOUNTER — Encounter: Payer: Self-pay | Admitting: Internal Medicine

## 2014-05-17 ENCOUNTER — Encounter: Payer: Self-pay | Admitting: Internal Medicine

## 2014-05-17 NOTE — Telephone Encounter (Signed)
i agree  Please  [facility] . Refill enough for a year.

## 2014-05-18 MED ORDER — NONFORMULARY OR COMPOUNDED ITEM: Status: DC

## 2014-05-18 NOTE — Telephone Encounter (Signed)
Called to the pharmacy and given to [facility] the pharmacist.

## 2014-05-20 ENCOUNTER — Other Ambulatory Visit: Payer: Self-pay | Admitting: Internal Medicine

## 2014-05-21 NOTE — Telephone Encounter (Signed)
Sent to the pharmacy by e-scribe. 

## 2014-08-18 ENCOUNTER — Other Ambulatory Visit: Payer: Self-pay | Admitting: Internal Medicine

## 2014-08-19 NOTE — Telephone Encounter (Signed)
Sent to the pharmacy by e-scribe. 

## 2014-08-19 NOTE — Telephone Encounter (Signed)
Ok x 6 months  Will be due for hgba1c and labs then

## 2014-08-19 NOTE — Telephone Encounter (Signed)
Should pt return in 6 or 12 months.  Lab work did not state.

## 2014-08-27 ENCOUNTER — Other Ambulatory Visit: Payer: Self-pay

## 2014-10-28 ENCOUNTER — Encounter: Payer: Self-pay | Admitting: Internal Medicine

## 2014-11-12 DIAGNOSIS — Z8719 Personal history of other diseases of the digestive system: Secondary | ICD-10-CM

## 2014-11-12 HISTORY — DX: Personal history of other diseases of the digestive system: Z87.19

## 2014-11-17 DIAGNOSIS — H2513 Age-related nuclear cataract, bilateral: Secondary | ICD-10-CM | POA: Diagnosis not present

## 2014-11-17 DIAGNOSIS — E119 Type 2 diabetes mellitus without complications: Secondary | ICD-10-CM | POA: Diagnosis not present

## 2014-11-17 DIAGNOSIS — H40013 Open angle with borderline findings, low risk, bilateral: Secondary | ICD-10-CM | POA: Diagnosis not present

## 2014-11-17 DIAGNOSIS — H04123 Dry eye syndrome of bilateral lacrimal glands: Secondary | ICD-10-CM | POA: Diagnosis not present

## 2014-11-17 DIAGNOSIS — H02831 Dermatochalasis of right upper eyelid: Secondary | ICD-10-CM | POA: Diagnosis not present

## 2014-11-17 DIAGNOSIS — H02834 Dermatochalasis of left upper eyelid: Secondary | ICD-10-CM | POA: Diagnosis not present

## 2014-11-17 LAB — HM DIABETES EYE EXAM

## 2014-11-29 ENCOUNTER — Encounter: Payer: Self-pay | Admitting: Family Medicine

## 2014-12-07 ENCOUNTER — Encounter: Payer: Self-pay | Admitting: Internal Medicine

## 2014-12-09 MED ORDER — AMLODIPINE BESYLATE 5 MG PO TABS: 5.0000 mg | ORAL_TABLET | Freq: Every day | ORAL | Status: DC

## 2014-12-09 MED ORDER — HYDROCODONE-ACETAMINOPHEN 5-325 MG PO TABS: 1.0000 | ORAL_TABLET | Freq: Every day | ORAL | Status: DC

## 2014-12-09 MED ORDER — GABAPENTIN 300 MG PO CAPS: ORAL_CAPSULE | ORAL | Status: DC

## 2014-12-09 NOTE — Telephone Encounter (Signed)
Ok to do a trial gabapentin 300 mg 1 po qd increase to tid disp 90 no refill. Hydrocodone  5 325 disp 10  1 po  qhs prn pain  Please contact her before weekend so she hcan get med for pick up

## 2014-12-09 NOTE — Telephone Encounter (Signed)
Pt notified to pick up rx of hydrocodone at the front desk.  Sent in gabapentin and a refill of amlodipine.  Pt instructed to take bps x 1 wk and call back with readings or send in MyChart.

## 2015-01-07 ENCOUNTER — Ambulatory Visit: Payer: Medicare Other | Admitting: Internal Medicine

## 2015-01-14 ENCOUNTER — Encounter: Payer: Self-pay | Admitting: Internal Medicine

## 2015-01-14 ENCOUNTER — Ambulatory Visit (INDEPENDENT_AMBULATORY_CARE_PROVIDER_SITE_OTHER): Payer: Medicare Other | Admitting: Internal Medicine

## 2015-01-14 ENCOUNTER — Telehealth: Payer: Self-pay | Admitting: Internal Medicine

## 2015-01-14 VITALS — BP 126/60 | Temp 98.1°F | Ht 66.5 in | Wt 164.0 lb

## 2015-01-14 DIAGNOSIS — E785 Hyperlipidemia, unspecified: Secondary | ICD-10-CM

## 2015-01-14 DIAGNOSIS — R7309 Other abnormal glucose: Secondary | ICD-10-CM | POA: Diagnosis not present

## 2015-01-14 DIAGNOSIS — R7303 Prediabetes: Secondary | ICD-10-CM

## 2015-01-14 DIAGNOSIS — T887XXA Unspecified adverse effect of drug or medicament, initial encounter: Secondary | ICD-10-CM | POA: Diagnosis not present

## 2015-01-14 DIAGNOSIS — Z79899 Other long term (current) drug therapy: Secondary | ICD-10-CM | POA: Diagnosis not present

## 2015-01-14 DIAGNOSIS — T50905A Adverse effect of unspecified drugs, medicaments and biological substances, initial encounter: Secondary | ICD-10-CM

## 2015-01-14 DIAGNOSIS — E039 Hypothyroidism, unspecified: Secondary | ICD-10-CM

## 2015-01-14 DIAGNOSIS — I10 Essential (primary) hypertension: Secondary | ICD-10-CM

## 2015-01-14 DIAGNOSIS — K589 Irritable bowel syndrome without diarrhea: Secondary | ICD-10-CM | POA: Diagnosis not present

## 2015-01-14 MED ORDER — HYDROCHLOROTHIAZIDE 25 MG PO TABS: 12.5000 mg | ORAL_TABLET | Freq: Every day | ORAL | Status: DC

## 2015-01-14 NOTE — Progress Notes (Signed)
Pre visit review using our clinic review tool, if applicable. No additional management support is needed unless otherwise documented below in the visit note.  Chief Complaint  Patient presents with  . 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 BP amlodipine causing lethargy so finally stopped  About 2.5 weeks ago  . bp doing ok  BG good at goal  Can take metformin LIPID: stopped zetia from abd cramps ( gi se. Had been given gabapentin and hydrocodone for  A recurrence of carotidynia  ang off meds and sx have gone  Took gaba about 1 month off for weeks  Estrogen cream has helped her recurrent utis  [facility]  D mannose ocass Synthroid no change  ROS: See pertinent positives and negatives per HPI. No cv pulm neuro sx of concern to her  Past Medical History  Diagnosis Date  . Hyperlipidemia   . Hypertension   . Hypothyroidism   . Gestational diabetes   . Carotidynia   . Syncope 2011    reaction to benicar    Family History  Problem Relation Age of Onset  . Hypertension Mother   . Dementia Mother   . Heart disease Father   . Dementia Father   . Diabetes Father   . Hypertension Brother     History   Social History  . Marital Status: Married    Spouse Name: N/A  . Number of Children: N/A  . Years of Education: N/A   Social History Main Topics  . Smoking status: Never Smoker   . Smokeless tobacco: Never Used  . Alcohol Use: Yes     Comment: glass of wine each day  . Drug Use: No  . Sexual Activity: No   Other Topics Concern  . 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 D-MANNOSE PO Take 2 g by mouth daily.  Marland Kitchen estradiol (ESTRACE VAGINAL) 0.1 MG/GM vaginal cream Place 1 Applicatorful vaginally at bedtime. Nightly x 2 wks.  Then 3 times weekly.  Marland Kitchen gabapentin (NEURONTIN) 300 MG capsule Take 1 tablet daily and increase to 3 tablets daily (Patient not taking: Reported on 01/14/2015)  . hydrochlorothiazide (HYDRODIURIL) 25 MG tablet Take 0.5 tablets (12.5 mg total) by mouth daily.  Marland Kitchen HYDROcodone-acetaminophen (NORCO/VICODIN) 5-325 MG per tablet Take 1 tablet by mouth at bedtime. (Patient not taking: Reported on 01/14/2015)  . levothyroxine (SYNTHROID, LEVOTHROID) 100 MCG tablet TAKE 1 TABLET EACH DAY.  . Melatonin 5 MG TABS Take 5 mg by mouth at bedtime.  . metFORMIN (GLUCOPHAGE-XR) 500 MG 24 hr tablet TAKE 1 TABLET EVERY DAY WITH BREAKFAST.  . NONFORMULARY OR COMPOUNDED ITEM Estriol 0.5mg /ml Cream: Insert 1 vaginally three times weekly  . PRESCRIPTION MEDICATION Generic Glucometer and test strips.  Lancets if needed.  . [DISCONTINUED] amLODipine (NORVASC) 5 MG tablet Take 1 tablet (5 mg total) by mouth daily. (Patient not taking: Reported on 01/14/2015)  . [DISCONTINUED] ezetimibe (ZETIA) 10 MG tablet Take 1 tablet (  10 mg total) by mouth daily. (Patient not taking: Reported on 01/14/2015)    EXAM:  BP 126/60 mmHg  Temp(Src) 98.1 F (36.7 C) (Oral)  Ht 5' 6.5" (1.689 m)  Wt 164 lb (74.39 kg)  BMI 26.08 kg/m2  Body mass index is 26.08 kg/(m^2).  GENERAL: vitals reviewed and listed above, alert, oriented, appears well hydrated and in no acute distress HEENT: atraumatic, conjunctiva  clear, no obvious abnormalities on inspection of external nose and ears OP : no lesion edema or exudate  NECK: no obvious masses on inspection palpation  LUNGS: clear to auscultation bilaterally, no wheezes, rales or rhonchi, good air movement CV: HRRR, no clubbing cyanosis or  peripheral edema nl cap refill  MS: moves all extremities without noticeable focal   abnormality PSYCH: pleasant and cooperative, no obvious depression or anxiety Lab Results  Component Value Date   WBC 8.6 05/19/2012   HGB 13.8 05/19/2012   HCT 41.4 05/19/2012   PLT 347.0 05/19/2012   GLUCOSE 110* 03/05/2014   CHOL 253* 03/05/2014   TRIG 155.0* 03/05/2014   HDL 45.20 03/05/2014   LDLDIRECT 167.6 01/10/2011   LDLCALC 177* 03/05/2014   ALT 15 01/10/2011   AST 17 01/10/2011   NA 137 03/05/2014   K 4.6 03/05/2014   CL 106 03/05/2014   CREATININE 0.8 03/05/2014   BUN 12 03/05/2014   CO2 23 03/05/2014   TSH 3.15 03/05/2014   HGBA1C 6.2 03/05/2014    ASSESSMENT AND PLAN:  Discussed the following assessment and plan:  Essential hypertension - se of meds ade arb amlodipine tral low dose thiazide  - Plan: Basic metabolic panel, Hemoglobin A1c, Hepatic function panel, Lipid panel, TSH, CBC with Differential/Platelet  Medication side effect, initial encounter - amlodipine  - Plan: Basic metabolic panel, Hemoglobin A1c, Hepatic function panel, Lipid panel, TSH, CBC with Differential/Platelet  Pre-diabetes - reported stable - Plan: Basic metabolic panel, Hemoglobin A1c, Hepatic function panel, Lipid panel, TSH, CBC with Differential/Platelet  Hypothyroidism, unspecified hypothyroidism type - Plan: Basic metabolic panel, Hemoglobin A1c, Hepatic function panel, Lipid panel, TSH, CBC with Differential/Platelet  Hyperlipidemia - intolerant statin and zetia  - Plan: Basic metabolic panel, Hemoglobin A1c, Hepatic function panel, Lipid panel, TSH, CBC with Differential/Platelet  Medication management - Plan: Basic metabolic panel, Hemoglobin A1c, Hepatic function panel, Lipid panel, TSH, CBC with Differential/Platelet  IBS (irritable bowel syndrome)  -Patient advised to return or notify health care team  if symptoms worsen ,persist or new concerns arise.  Patient Instructions  Continue lifestyle intervention healthy eating and exercise . Trial low dose hctz . Labs in  about a month will put in orders.   Including a1c    Standley Brooking. Panosh M.D.

## 2015-01-14 NOTE — Patient Instructions (Signed)
Continue lifestyle intervention healthy eating and exercise . Trial low dose hctz . Labs in about a month will put in orders.   Including a1c

## 2015-01-14 NOTE — Telephone Encounter (Signed)
emmi emailed °

## 2015-01-16 DIAGNOSIS — T887XXA Unspecified adverse effect of drug or medicament, initial encounter: Secondary | ICD-10-CM | POA: Insufficient documentation

## 2015-01-16 DIAGNOSIS — K589 Irritable bowel syndrome without diarrhea: Secondary | ICD-10-CM | POA: Insufficient documentation

## 2015-01-16 DIAGNOSIS — I10 Essential (primary) hypertension: Secondary | ICD-10-CM | POA: Insufficient documentation

## 2015-02-14 ENCOUNTER — Other Ambulatory Visit (INDEPENDENT_AMBULATORY_CARE_PROVIDER_SITE_OTHER): Payer: Medicare Other

## 2015-02-14 DIAGNOSIS — R7309 Other abnormal glucose: Secondary | ICD-10-CM | POA: Diagnosis not present

## 2015-02-14 DIAGNOSIS — I10 Essential (primary) hypertension: Secondary | ICD-10-CM | POA: Diagnosis not present

## 2015-02-14 DIAGNOSIS — E785 Hyperlipidemia, unspecified: Secondary | ICD-10-CM | POA: Diagnosis not present

## 2015-02-14 DIAGNOSIS — R7303 Prediabetes: Secondary | ICD-10-CM

## 2015-02-14 LAB — CBC WITH DIFFERENTIAL/PLATELET
Basophils Absolute: 0 10*3/uL (ref 0.0–0.1)
Basophils Relative: 0.4 % (ref 0.0–3.0)
EOS PCT: 1.3 % (ref 0.0–5.0)
Eosinophils Absolute: 0.1 10*3/uL (ref 0.0–0.7)
HCT: 40.4 % (ref 36.0–46.0)
Hemoglobin: 13.7 g/dL (ref 12.0–15.0)
LYMPHS PCT: 47.4 % — AB (ref 12.0–46.0)
Lymphs Abs: 4.7 10*3/uL — ABNORMAL HIGH (ref 0.7–4.0)
MCHC: 34 g/dL (ref 30.0–36.0)
MCV: 86.5 fl (ref 78.0–100.0)
MONOS PCT: 6.9 % (ref 3.0–12.0)
Monocytes Absolute: 0.7 10*3/uL (ref 0.1–1.0)
NEUTROS PCT: 44 % (ref 43.0–77.0)
Neutro Abs: 4.4 10*3/uL (ref 1.4–7.7)
PLATELETS: 382 10*3/uL (ref 150.0–400.0)
RBC: 4.67 Mil/uL (ref 3.87–5.11)
RDW: 14.2 % (ref 11.5–15.5)
WBC: 10 10*3/uL (ref 4.0–10.5)

## 2015-02-14 LAB — BASIC METABOLIC PANEL
BUN: 18 mg/dL (ref 6–23)
CHLORIDE: 99 meq/L (ref 96–112)
CO2: 30 mEq/L (ref 19–32)
Calcium: 9.9 mg/dL (ref 8.4–10.5)
Creatinine, Ser: 0.79 mg/dL (ref 0.40–1.20)
GFR: 77.08 mL/min (ref >60.00)
GLUCOSE: 108 mg/dL — AB (ref 70–99)
Potassium: 4.3 mEq/L (ref 3.5–5.1)
Sodium: 134 mEq/L — ABNORMAL LOW (ref 135–145)

## 2015-02-14 LAB — HEPATIC FUNCTION PANEL
ALK PHOS: 39 U/L (ref 39–117)
ALT: 15 U/L (ref 0–35)
AST: 17 U/L (ref 0–37)
Albumin: 4.3 g/dL (ref 3.5–5.2)
BILIRUBIN DIRECT: 0 mg/dL (ref 0.0–0.3)
Total Bilirubin: 0.3 mg/dL (ref 0.2–1.2)
Total Protein: 7.4 g/dL (ref 6.0–8.3)

## 2015-02-14 LAB — LIPID PANEL
CHOL/HDL RATIO: 6
Cholesterol: 277 mg/dL — ABNORMAL HIGH (ref 0–200)
HDL: 43.9 mg/dL (ref >39.00)
NONHDL: 233.1
TRIGLYCERIDES: 370 mg/dL — AB (ref 0.0–149.0)
VLDL: 74 mg/dL — ABNORMAL HIGH (ref 0.0–40.0)

## 2015-02-14 LAB — MICROALBUMIN / CREATININE URINE RATIO
Creatinine,U: 121 mg/dL
MICROALB/CREAT RATIO: 0.6 mg/g (ref 0.0–30.0)
Microalb, Ur: 0.7 mg/dL (ref 0.0–1.9)

## 2015-02-14 LAB — LDL CHOLESTEROL, DIRECT: LDL DIRECT: 182 mg/dL

## 2015-02-14 LAB — HEMOGLOBIN A1C: Hgb A1c MFr Bld: 6.8 % — ABNORMAL HIGH (ref 4.6–6.5)

## 2015-02-14 LAB — TSH: TSH: 4.48 u[IU]/mL (ref 0.35–4.50)

## 2015-02-19 ENCOUNTER — Other Ambulatory Visit: Payer: Self-pay | Admitting: Internal Medicine

## 2015-02-22 ENCOUNTER — Encounter: Payer: Self-pay | Admitting: Family Medicine

## 2015-02-22 ENCOUNTER — Other Ambulatory Visit: Payer: Self-pay | Admitting: Family Medicine

## 2015-02-24 ENCOUNTER — Other Ambulatory Visit: Payer: Self-pay | Admitting: Internal Medicine

## 2015-02-24 NOTE — Telephone Encounter (Signed)
Sent to the pharmacy by e-scribe. 

## 2015-03-03 ENCOUNTER — Ambulatory Visit (INDEPENDENT_AMBULATORY_CARE_PROVIDER_SITE_OTHER): Payer: Medicare Other | Admitting: Internal Medicine

## 2015-03-03 ENCOUNTER — Encounter: Payer: Self-pay | Admitting: Internal Medicine

## 2015-03-03 VITALS — BP 136/80 | Temp 98.5°F | Ht 65.5 in | Wt 160.0 lb

## 2015-03-03 DIAGNOSIS — R413 Other amnesia: Secondary | ICD-10-CM

## 2015-03-03 DIAGNOSIS — E039 Hypothyroidism, unspecified: Secondary | ICD-10-CM

## 2015-03-03 DIAGNOSIS — R7303 Prediabetes: Secondary | ICD-10-CM

## 2015-03-03 DIAGNOSIS — E559 Vitamin D deficiency, unspecified: Secondary | ICD-10-CM | POA: Diagnosis not present

## 2015-03-03 DIAGNOSIS — R7309 Other abnormal glucose: Secondary | ICD-10-CM | POA: Diagnosis not present

## 2015-03-03 DIAGNOSIS — I10 Essential (primary) hypertension: Secondary | ICD-10-CM | POA: Diagnosis not present

## 2015-03-03 LAB — VITAMIN D 25 HYDROXY (VIT D DEFICIENCY, FRACTURES): VITD: 21.54 ng/mL — AB (ref 30.00–100.00)

## 2015-03-03 LAB — VITAMIN B12: Vitamin B-12: 247 pg/mL (ref 211–911)

## 2015-03-03 MED ORDER — METFORMIN HCL ER 500 MG PO TB24: 500.0000 mg | ORAL_TABLET | Freq: Two times a day (BID) | ORAL | Status: DC

## 2015-03-03 MED ORDER — LEVOTHYROXINE SODIUM 112 MCG PO TABS: 112.0000 ug | ORAL_TABLET | Freq: Every day | ORAL | Status: DC

## 2015-03-03 NOTE — Addendum Note (Signed)
Addended by: Miles Costain T on: 03/03/2015 12:59 PM   Modules accepted: Medications

## 2015-03-03 NOTE — Patient Instructions (Signed)
  Keep monitoring.  bp readings . Increase thyroid replacement.  Will notify you  of labs when available. tsh and  a1c in 3 months   Plan on future low  Dose other statin  Besides  atorva and crestor .such as prava.

## 2015-03-03 NOTE — Progress Notes (Signed)
Pre visit review using our clinic review tool, if applicable. No additional management support is needed unless otherwise documented below in the visit note.  Chief Complaint  Patient presents with  . Follow-up    HPI:  Valerie Brady 69 y.o.   Comes in for metabolic  Issues . Concerns since last visit and meds  Memory issues  ? And sleepy more than not recently ?  If   Increasing  Thyroid Med  Could help Slept 9 hours  No sleep apnea.   Feeling like  Sleepy .  Noticed in last few weeks.  Now feeling well  Exercise  Ing    On daily metformin  for a while  .   I  Increase  To 500 bid metformin   For about a week   And s topped the  Thiazide . Cause cbg were creeping up in the 130 range  Post prandials pretty good.  BP 136/80   130  120    So far   Off  Thiazide for weeks.   Had episode of forgetting   Focus and attention     ATM  card And left money in the  ATM .   And when walking to car talking   Feels drugged   Feeling.    Denies sx of osa , nocturia x 1 no change  exercising and eating healthy mostly   Off gaba takes prn has  ROS: See pertinent positives and negatives per HPI. Has had se of atorva and    crestor in past myalgias willing tot try low dose statin after above  In range with c q 10   Past Medical History  Diagnosis Date  . Hyperlipidemia   . Hypertension   . Hypothyroidism   . Gestational diabetes   . Carotidynia   . Syncope 2011    reaction to benicar    Family History  Problem Relation Age of Onset  . Hypertension Mother   . Dementia Mother   . Heart disease Father   . Dementia Father   . Diabetes Father   . Hypertension Brother     History   Social History  . Marital Status: Married    Spouse Name: N/A  . Number of Children: N/A  . Years of Education: N/A   Social History Main Topics  . Smoking status: Never Smoker   . Smokeless tobacco: Never Used  . Alcohol Use: Yes     Comment: glass of wine each day  . Drug Use: No  . Sexual  Activity: No   Other Topics Concern  . 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 D-MANNOSE PO Take 2 g by mouth daily.  Marland Kitchen gabapentin (NEURONTIN) 300 MG capsule Take 1 tablet daily and increase to 3 tablets daily  . HYDROcodone-acetaminophen (NORCO/VICODIN) 5-325 MG per tablet Take 1 tablet by mouth at bedtime.  . Melatonin 5 MG TABS Take 5 mg by mouth at bedtime.  . metFORMIN (GLUCOPHAGE-XR) 500 MG 24 hr tablet TAKE 1 TABLET EVERY DAY WITH BREAKFAST. (Patient taking differently: TAKE 1 TABLET TWICE DAILY)  . NONFORMULARY OR COMPOUNDED ITEM Estriol 0.54m/ml Cream: Insert 1 vaginally  three times weekly  . PRESCRIPTION MEDICATION Generic Glucometer and test strips.  Lancets if needed.  . [DISCONTINUED] levothyroxine (SYNTHROID, LEVOTHROID) 100 MCG tablet TAKE 1 TABLET EACH DAY.  . hydrochlorothiazide (HYDRODIURIL) 25 MG tablet Take 0.5 tablets (12.5 mg total) by mouth daily. (Patient not taking: Reported on 03/03/2015)  . levothyroxine (SYNTHROID, LEVOTHROID) 112 MCG tablet Take 1 tablet (112 mcg total) by mouth daily.  . [DISCONTINUED] estradiol (ESTRACE VAGINAL) 0.1 MG/GM vaginal cream Place 1 Applicatorful vaginally at bedtime. Nightly x 2 wks.  Then 3 times weekly.    EXAM:  BP 136/80 mmHg  Temp(Src) 98.5 F (36.9 C) (Oral)  Ht 5' 5.5" (1.664 m)  Wt 160 lb (72.576 kg)  BMI 26.21 kg/m2  Body mass index is 26.21 kg/(m^2).  GENERAL: vitals reviewed and listed above, alert, oriented, appears well hydrated and in no acute distress HEENT: atraumatic, conjunctiva  clear, no obvious abnormalities on inspection of external nose and ears PSYCH: pleasant and cooperative, no obvious depression or anxiety Lab Results  Component Value Date   WBC 10.0 02/14/2015   HGB 13.7  02/14/2015   HCT 40.4 02/14/2015   PLT 382.0 02/14/2015   GLUCOSE 108* 02/14/2015   CHOL 277* 02/14/2015   TRIG 370.0* 02/14/2015   HDL 43.90 02/14/2015   LDLDIRECT 182.0 02/14/2015   LDLCALC 177* 03/05/2014   ALT 15 02/14/2015   AST 17 02/14/2015   NA 134* 02/14/2015   K 4.3 02/14/2015   CL 99 02/14/2015   CREATININE 0.79 02/14/2015   BUN 18 02/14/2015   CO2 30 02/14/2015   TSH 4.48 02/14/2015   HGBA1C 6.8* 02/14/2015   MICROALBUR 0.7 02/14/2015   Reviewed labs and findings  No formal memory testing done  ASSESSMENT AND PLAN:  Discussed the following assessment and plan:  Essential hypertension - stopped iuretic for metabolic reasons  considder  carvedilol or diltiazem if needed   Memory disturbance - on metfromin check b12 adjust thyroid med  follow up - Plan: Vitamin B12  Pre-diabetes - bnow is diabetic range   - Plan: Vitamin B12  Hypothyroidism, unspecified hypothyroidism type - inc to 112 check 3 months  Vitamin D deficiency R/O - Plan: Vit D  25 hydroxy (rtn osteoporosis monitoring) May benefit from vit d to prevent statin myalgia   -Patient advised to return or notify health care team  if symptoms worsen ,persist or new concerns arise.  Patient Instructions   Keep monitoring.  bp readings . Increase thyroid replacement.  Will notify you  of labs when available. tsh and  a1c in 3 months   Plan on future low  Dose other statin  Besides  atorva and crestor .such as prava.     Standley Brooking. Diar Berkel MD

## 2015-03-07 ENCOUNTER — Encounter: Payer: Self-pay | Admitting: Internal Medicine

## 2015-04-11 ENCOUNTER — Encounter: Payer: Self-pay | Admitting: Internal Medicine

## 2015-04-12 MED ORDER — METFORMIN HCL ER 500 MG PO TB24: 500.0000 mg | ORAL_TABLET | Freq: Two times a day (BID) | ORAL | Status: DC

## 2015-04-12 NOTE — Telephone Encounter (Signed)
Ok to stay on bid if tolerated . Sound like tolerated so can change    I will send in

## 2015-04-12 NOTE — Telephone Encounter (Signed)
Last note states, "Increase To 500 bid metformin For about a week" Should she continue or was she only suppose to take medication bid for one week?

## 2015-04-26 ENCOUNTER — Telehealth: Payer: Self-pay | Admitting: *Deleted

## 2015-04-26 MED ORDER — GLUCOSE BLOOD VI STRP: ORAL_STRIP | Status: DC

## 2015-04-26 NOTE — Telephone Encounter (Signed)
Sent to the pharmacy by e-scribe. 

## 2015-04-26 NOTE — Telephone Encounter (Signed)
Patient is requesting a refill of Truetest glucose test strips and lancets.  Patient will test once daily.  Okay to fill? [employer]

## 2015-04-27 ENCOUNTER — Other Ambulatory Visit: Payer: Self-pay | Admitting: Family Medicine

## 2015-04-27 MED ORDER — LANCETS MISC: Status: DC

## 2015-05-09 ENCOUNTER — Other Ambulatory Visit: Payer: Self-pay

## 2015-05-12 ENCOUNTER — Other Ambulatory Visit: Payer: Self-pay | Admitting: Family Medicine

## 2015-05-12 MED ORDER — GABAPENTIN 300 MG PO CAPS: ORAL_CAPSULE | ORAL | Status: DC

## 2015-05-21 ENCOUNTER — Encounter: Payer: Self-pay | Admitting: Family Medicine

## 2015-05-21 ENCOUNTER — Ambulatory Visit (INDEPENDENT_AMBULATORY_CARE_PROVIDER_SITE_OTHER): Payer: Medicare Other | Admitting: Family Medicine

## 2015-05-21 VITALS — BP 130/80 | HR 99 | Temp 98.5°F | Ht 65.5 in | Wt 158.8 lb

## 2015-05-21 DIAGNOSIS — K5732 Diverticulitis of large intestine without perforation or abscess without bleeding: Secondary | ICD-10-CM | POA: Diagnosis not present

## 2015-05-21 MED ORDER — METRONIDAZOLE 500 MG PO TABS: 500.0000 mg | ORAL_TABLET | Freq: Three times a day (TID) | ORAL | Status: DC

## 2015-05-21 MED ORDER — CIPROFLOXACIN HCL 500 MG PO TABS: 500.0000 mg | ORAL_TABLET | Freq: Two times a day (BID) | ORAL | Status: DC

## 2015-05-21 NOTE — Patient Instructions (Signed)
Probiotic OTC [facility] [employer] [facility] or online at [employer] 10 strain probiotic daily  Diverticulitis Diverticulitis is inflammation or infection of small pouches in your colon that form when you have a condition called diverticulosis. The pouches in your colon are called diverticula. Your colon, or large intestine, is where water is absorbed and stool is formed. Complications of diverticulitis can include:  Bleeding.  Severe infection.  Severe pain.  Perforation of your colon.  Obstruction of your colon. CAUSES  Diverticulitis is caused by bacteria. Diverticulitis happens when stool becomes trapped in diverticula. This allows bacteria to grow in the diverticula, which can lead to inflammation and infection. RISK FACTORS People with diverticulosis are at risk for diverticulitis. Eating a diet that does not include enough fiber from fruits and vegetables may make diverticulitis more likely to develop. SYMPTOMS  Symptoms of diverticulitis may include:  Abdominal pain and tenderness. The pain is normally located on the left side of the abdomen, but may occur in other areas.  Fever and chills.  Bloating.  Cramping.  Nausea.  Vomiting.  Constipation.  Diarrhea.  Blood in your stool. DIAGNOSIS  Your health care provider will ask you about your medical history and do a physical exam. You may need to have tests done because many medical conditions can cause the same symptoms as diverticulitis. Tests may include:  Blood tests.  Urine tests.  Imaging tests of the abdomen, including X-rays and CT scans. When your condition is under control, your health care provider may recommend that you have a colonoscopy. A colonoscopy can show how severe your diverticula are and whether something else is causing your symptoms. TREATMENT  Most cases of diverticulitis are mild and can be treated at home. Treatment may include:  Taking  over-the-counter pain medicines.  Following a clear liquid diet.  Taking antibiotic medicines by mouth for 7-10 days. More severe cases may be treated at a hospital. Treatment may include:  Not eating or drinking.  Taking prescription pain medicine.  Receiving antibiotic medicines through an IV tube.  Receiving fluids and nutrition through an IV tube.  Surgery. HOME CARE INSTRUCTIONS   Follow your health care provider's instructions carefully.  Follow a full liquid diet or other diet as directed by your health care provider. After your symptoms improve, your health care provider may tell you to change your diet. He or she may recommend you eat a high-fiber diet. Fruits and vegetables are good sources of fiber. Fiber makes it easier to pass stool.  Take fiber supplements or probiotics as directed by your health care provider.  Only take medicines as directed by your health care provider.  Keep all your follow-up appointments. SEEK MEDICAL CARE IF:   Your pain does not improve.  You have a hard time eating food.  Your bowel movements do not return to normal. SEEK IMMEDIATE MEDICAL CARE IF:   Your pain becomes worse.  Your symptoms do not get better.  Your symptoms suddenly get worse.  You have a fever.  You have repeated vomiting.  You have bloody or black, tarry stools. MAKE SURE YOU:   Understand these instructions.  Will watch your condition.  Will get help right away if you are not doing well or get worse. Document Released: 08/08/2005 Document Revised: 11/03/2013 Document Reviewed: 09/23/2013 [facility] Patient Information 2015 [place], [place]. This information is not intended to replace advice given to you by your health care provider. Make sure you discuss any questions you have  with your health care provider.  

## 2015-05-21 NOTE — Progress Notes (Signed)
Pre visit review using our clinic review tool, if applicable. No additional management support is needed unless otherwise documented below in the visit note. 

## 2015-05-22 ENCOUNTER — Encounter: Payer: Self-pay | Admitting: Family Medicine

## 2015-05-22 DIAGNOSIS — K5732 Diverticulitis of large intestine without perforation or abscess without bleeding: Secondary | ICD-10-CM | POA: Insufficient documentation

## 2015-05-22 HISTORY — DX: Diverticulitis of large intestine without perforation or abscess without bleeding: K57.32

## 2015-05-22 NOTE — Assessment & Plan Note (Signed)
Well controlled, no changes to meds. Encouraged heart healthy diet such as the DASH diet and exercise as tolerated.  °

## 2015-05-22 NOTE — Assessment & Plan Note (Signed)
LLQ pain similar to previous episode of diverticulitis, will start on Cipro, Flagyl, probiotics. Report if symptoms worsen

## 2015-05-22 NOTE — Progress Notes (Signed)
Valerie Brady  [id number] [birth year] 05/22/2015      Progress Note-Follow Up  Subjective  Chief Complaint  Chief Complaint  Patient presents with  . Abdominal Pain    HPI  Patient is a 69 y.o. female in today for routine medical care. Patient is in today with a 3-4 day history of left lower quadrant pain. She was out of town and she maintained bowel rest and has been able to tolerate her symptoms until she was able to get home. She describes the pain as a dull ache. She's had low-grade fevers, malaise and myalgias. She did not have a bowel movement for 2 days but then had a normal one today. She's had no bloody or tarry stool. She denies diarrhea, nausea or vomiting. She has noted some low-grade anorexia.. Denies CP/palp/SOB/HA/congestion/fevers or GU c/o. Taking meds as prescribed  Past Medical History  Diagnosis Date  . Hyperlipidemia   . Hypertension   . Hypothyroidism   . Gestational diabetes   . Carotidynia   . Syncope 2011    reaction to benicar  . Diverticulitis of colon 05/22/2015    Past Surgical History  Procedure Laterality Date  . Tubal ligation    . Tonsillectomy    . Fibula fracture surgery      OPEN REDUCTION AND PINNING OF SPIRAL FX LEFT FIBULA  . Cervical polyp removed      Family History  Problem Relation Age of Onset  . Hypertension Mother   . Dementia Mother   . Heart disease Father   . Dementia Father   . Diabetes Father   . Hypertension Brother     History   Social History  . Marital Status: Married    Spouse Name: N/A  . Number of Children: N/A  . Years of Education: N/A   Occupational History  . Not on file.   Social History Main Topics  . Smoking status: Never Smoker   . Smokeless tobacco: Never Used  . Alcohol Use: Yes     Comment: glass of wine each day  . Drug Use: No  . Sexual Activity: No   Other Topics Concern  . 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 D-MANNOSE PO Take 2 g by mouth daily.    Marland Kitchen gabapentin (NEURONTIN) 300 MG capsule Take 1 tablet daily and increase to 3 tablets daily 90 capsule 0  . glucose blood (TRUETEST TEST) test strip Use to test blood sugar once daily 100 each 3  . Lancets MISC Use to test blood sugar once daily 100 each 3  . levothyroxine (SYNTHROID, LEVOTHROID) 112 MCG tablet Take 1 tablet (112 mcg total) by mouth daily. 90 tablet 3  . Melatonin 5 MG TABS Take 5 mg by mouth at bedtime.    . metFORMIN (GLUCOPHAGE-XR) 500 MG 24 hr tablet Take 1 tablet (500 mg total) by mouth 2 (two) times daily. 180 tablet 3  . NONFORMULARY OR COMPOUNDED ITEM Estriol 0.5mg /ml Cream: Insert 1 vaginally three times weekly 36 each 3  . PRESCRIPTION MEDICATION Generic Glucometer and test strips.  Lancets if needed.     No current facility-administered  medications on file prior to visit.    Allergies  Allergen Reactions  . Enalapril Maleate     REACTION: Cough  . Olmesartan Medoxomil     REACTION: Gas and chest pain  . Amlodipine Other (See Comments)    Fatigue lethargy at 5 mg dose  . Zetia [Ezetimibe] Other (See Comments)    GI cramps    Review of Systems  Review of Systems  Constitutional: Positive for fever and malaise/fatigue.  HENT: Negative for congestion.   Eyes: Negative for discharge.  Respiratory: Negative for shortness of breath.   Cardiovascular: Positive for chest pain. Negative for palpitations and leg swelling.  Gastrointestinal: Positive for nausea and abdominal pain. Negative for diarrhea, constipation, blood in stool and melena.  Genitourinary: Negative for dysuria.  Musculoskeletal: Positive for myalgias. Negative for falls.  Skin: Negative for rash.  Neurological: Negative for loss of consciousness and  headaches.  Endo/Heme/Allergies: Negative for polydipsia.  Psychiatric/Behavioral: Negative for depression and suicidal ideas. The patient is not nervous/anxious and does not have insomnia.     Objective  BP 130/80 mmHg  Pulse 99  Temp(Src) 98.5 F (36.9 C) (Oral)  Ht 5' 5.5" (1.664 m)  Wt 158 lb 12.8 oz (72.031 kg)  BMI 26.01 kg/m2  SpO2 97%  Physical Exam  Physical Exam  Constitutional: She is oriented to person, place, and time and well-developed, well-nourished, and in no distress. No distress.  HENT:  Head: Normocephalic and atraumatic.  Eyes: Conjunctivae are normal.  Neck: Neck supple. No thyromegaly present.  Cardiovascular: Normal rate, regular rhythm and normal heart sounds.   No murmur heard. Pulmonary/Chest: Effort normal and breath sounds normal. She has no wheezes.  Abdominal: Soft. Bowel sounds are normal. She exhibits no distension and no mass. There is tenderness. There is no rebound and no guarding.  Mild LLQ pain with palp  Musculoskeletal: She exhibits no edema.  Lymphadenopathy:    She has no cervical adenopathy.  Neurological: She is alert and oriented to person, place, and time.  Skin: Skin is warm and dry. No rash noted. She is not diaphoretic.  Psychiatric: Memory, affect and judgment normal.    Lab Results  Component Value Date   TSH 4.48 02/14/2015   Lab Results  Component Value Date   WBC 10.0 02/14/2015   HGB 13.7 02/14/2015   HCT 40.4 02/14/2015   MCV 86.5 02/14/2015   PLT 382.0 02/14/2015   Lab Results  Component Value Date   CREATININE 0.79 02/14/2015   BUN 18 02/14/2015   NA 134* 02/14/2015   K 4.3 02/14/2015   CL 99 02/14/2015   CO2 30 02/14/2015   Lab Results  Component Value Date   ALT 15 02/14/2015   AST 17 02/14/2015   ALKPHOS 39 02/14/2015   BILITOT 0.3 02/14/2015   Lab Results  Component Value Date   CHOL 277* 02/14/2015   Lab Results  Component Value Date   HDL 43.90 02/14/2015   Lab Results  Component  Value Date   LDLCALC 177* 03/05/2014   Lab Results  Component Value Date   TRIG 370.0* 02/14/2015   Lab Results  Component Value Date   CHOLHDL 6 02/14/2015     Assessment & Plan  HYPERTENSION Well controlled, no changes to meds. Encouraged heart healthy diet such as the DASH diet and exercise as tolerated.   Diverticulitis of colon LLQ pain similar to previous episode of diverticulitis, will start on Cipro, Flagyl, probiotics. Report if symptoms worsen

## 2015-06-06 ENCOUNTER — Other Ambulatory Visit (INDEPENDENT_AMBULATORY_CARE_PROVIDER_SITE_OTHER): Payer: Medicare Other

## 2015-06-06 DIAGNOSIS — E785 Hyperlipidemia, unspecified: Secondary | ICD-10-CM | POA: Diagnosis not present

## 2015-06-06 DIAGNOSIS — K589 Irritable bowel syndrome without diarrhea: Secondary | ICD-10-CM

## 2015-06-06 DIAGNOSIS — R7309 Other abnormal glucose: Secondary | ICD-10-CM

## 2015-06-06 DIAGNOSIS — E039 Hypothyroidism, unspecified: Secondary | ICD-10-CM

## 2015-06-06 DIAGNOSIS — T50905A Adverse effect of unspecified drugs, medicaments and biological substances, initial encounter: Secondary | ICD-10-CM

## 2015-06-06 DIAGNOSIS — R7303 Prediabetes: Secondary | ICD-10-CM

## 2015-06-06 DIAGNOSIS — Z79899 Other long term (current) drug therapy: Secondary | ICD-10-CM | POA: Diagnosis not present

## 2015-06-06 DIAGNOSIS — I10 Essential (primary) hypertension: Secondary | ICD-10-CM

## 2015-06-06 LAB — TSH: TSH: 1.12 u[IU]/mL (ref 0.35–4.50)

## 2015-06-06 LAB — HEMOGLOBIN A1C: Hgb A1c MFr Bld: 6.5 % (ref 4.6–6.5)

## 2015-06-27 ENCOUNTER — Ambulatory Visit (INDEPENDENT_AMBULATORY_CARE_PROVIDER_SITE_OTHER): Payer: Medicare Other | Admitting: Family Medicine

## 2015-06-27 VITALS — BP 132/78 | HR 72 | Temp 98.3°F | Resp 18 | Ht 66.0 in | Wt 160.0 lb

## 2015-06-27 DIAGNOSIS — R300 Dysuria: Secondary | ICD-10-CM | POA: Diagnosis not present

## 2015-06-27 DIAGNOSIS — R350 Frequency of micturition: Secondary | ICD-10-CM | POA: Diagnosis not present

## 2015-06-27 DIAGNOSIS — N3000 Acute cystitis without hematuria: Secondary | ICD-10-CM | POA: Diagnosis not present

## 2015-06-27 LAB — POCT UA - MICROSCOPIC ONLY
Casts, Ur, LPF, POC: NEGATIVE
Crystals, Ur, HPF, POC: NEGATIVE
MUCUS UA: NEGATIVE
YEAST UA: NEGATIVE

## 2015-06-27 LAB — POCT URINALYSIS DIPSTICK
BILIRUBIN UA: NEGATIVE
GLUCOSE UA: NEGATIVE
Ketones, UA: NEGATIVE
NITRITE UA: NEGATIVE
Protein, UA: NEGATIVE
Spec Grav, UA: 1.025
Urobilinogen, UA: 0.2
pH, UA: 5.5

## 2015-06-27 MED ORDER — NITROFURANTOIN MONOHYD MACRO 100 MG PO CAPS: 100.0000 mg | ORAL_CAPSULE | Freq: Two times a day (BID) | ORAL | Status: DC

## 2015-06-27 NOTE — Patient Instructions (Signed)
yoyu have a UTI We will treat with macrobid twice daily for 5 days  You can use an over the counter pyridium or AZO for symptomatic relief

## 2015-06-27 NOTE — Progress Notes (Signed)
Subjective:    Patient ID: Monia Pouch, female    DOB: 08/08/1946, 69 y.o.   MRN: [id number]  HPI    Review of Systems     Objective:   Physical Exam        Assessment & Plan:   [facility] [address] [place] [address] [phone]  Date:  06/27/2015   Name:  Suzan Manon   DOB:  1946-01-15   MRN:  [id number]  PCP:  Lottie Dawson, MD    Chief Complaint: Dysuria and Urinary Frequency   History of Present Illness:  Lonette Stevison is a 69 y.o. very pleasant female patient who presents with the following:  Burning and urinary frequency that started yesterday +suprapubic pain No fever, no back pain, no flank pain No vaginal discharge, no bleeding, no hematuria Hx of frequent UTIs until starting estrogen cream Usually takes ciprofloxacin for UTIs as it is e coli No CP/SOB/ no abd pain  Patient Active Problem List   Diagnosis Date Noted  . Diverticulitis of colon 05/22/2015  . IBS (irritable bowel syndrome) 01/16/2015  . Essential hypertension 01/16/2015  . Medication side effect 01/16/2015  . Unspecified hypothyroidism 03/05/2014  . Statin intolerance 03/05/2014  . History of recurrent UTIs 06/10/2013  . Pre-diabetes 02/10/2013  . Hypothyroidism   . ADVERSE REACTION TO MEDICATION 05/09/2010  . ALLERGIC RHINITIS, SEASONAL, MILD 09/08/2009  . CARCINOMA, SKIN, SQUAMOUS CELL 05/03/2009  . URINARY INCONTINENCE, URGE, MILD 05/03/2009  . HYPERLIPIDEMIA 05/07/2007  . HYPERTENSION 05/07/2007  . HYPERGLYCEMIA, FASTING 05/07/2007    Past Medical History  Diagnosis Date  . Hyperlipidemia   . Hypertension   . Hypothyroidism   . Gestational diabetes   . Carotidynia   . Syncope 2011    reaction to benicar  . Diverticulitis of colon 05/22/2015    Past Surgical History  Procedure Laterality Date  . Tubal ligation    . Tonsillectomy    . Fibula fracture surgery      OPEN REDUCTION AND PINNING OF SPIRAL FX  LEFT FIBULA  . Cervical polyp removed      Social History  Substance Use Topics  . Smoking status: Never Smoker   . Smokeless tobacco: Never Used  . Alcohol Use: Yes     Comment: glass of wine each day    Family History  Problem Relation Age of Onset  . Hypertension Mother   . Dementia Mother   . Heart disease Father   . Dementia Father   . Diabetes Father   . Hypertension Brother     Allergies  Allergen Reactions  . Enalapril Maleate     REACTION: Cough  . Olmesartan Medoxomil     REACTION: Gas and chest pain  . Amlodipine Other (See Comments)    Fatigue lethargy at 5 mg dose  . Zetia [Ezetimibe] Other (See Comments)    GI cramps    Medication list has been reviewed and updated.  Current Outpatient Prescriptions on File Prior to Visit  Medication Sig Dispense Refill  . D-MANNOSE PO Take 2 g by mouth daily.    Marland Kitchen gabapentin (NEURONTIN) 300 MG capsule Take 1 tablet daily and increase to 3 tablets daily 90 capsule 0  . glucose blood (TRUETEST TEST) test strip Use to test blood sugar once daily 100 each 3  . Lancets MISC Use to test blood sugar once daily 100 each 3  . levothyroxine (SYNTHROID, LEVOTHROID) 112 MCG tablet Take 1  tablet (112 mcg total) by mouth daily. 90 tablet 3  . Melatonin 5 MG TABS Take 5 mg by mouth at bedtime.    . metFORMIN (GLUCOPHAGE-XR) 500 MG 24 hr tablet Take 1 tablet (500 mg total) by mouth 2 (two) times daily. 180 tablet 3  . NONFORMULARY OR COMPOUNDED ITEM Estriol 0.5mg /ml Cream: Insert 1 vaginally three times weekly 36 each 3  . PRESCRIPTION MEDICATION Generic Glucometer and test strips.  Lancets if needed.    . ciprofloxacin (CIPRO) 500 MG tablet Take 1 tablet (500 mg total) by mouth 2 (two) times daily. (Patient not taking: Reported on 06/27/2015) 20 tablet 0  . metroNIDAZOLE (FLAGYL) 500 MG tablet Take 1 tablet (500 mg total) by mouth 3 (three) times daily. (Patient not taking: Reported on 06/27/2015) 30 tablet 0   No current  facility-administered medications on file prior to visit.    Review of Systems:  All other pertinent ROS negative except as seen in HPI  Physical Examination: Filed Vitals:   06/27/15 1804  BP: 132/78  Pulse: 72  Temp: 98.3 F (36.8 C)  Resp: 18   Filed Vitals:   06/27/15 1804  Height: 5\' 6"  (1.676 m)  Weight: 160 lb (72.576 kg)   Body mass index is 25.84 kg/(m^2). Ideal Body Weight: Weight in (lb) to have BMI = 25: 154.6  Results for orders placed or performed in visit on 06/27/15  POCT UA - Microscopic Only  Result Value Ref Range   WBC, Ur, HPF, POC 10-15    RBC, urine, microscopic 2-4    Bacteria, U Microscopic 1+    Mucus, UA neg    Epithelial cells, urine per micros 3-5    Crystals, Ur, HPF, POC neg    Casts, Ur, LPF, POC neg    Yeast, UA neg   POCT urinalysis dipstick  Result Value Ref Range   Color, UA yellow    Clarity, UA cloudy    Glucose, UA neg    Bilirubin, UA neg    Ketones, UA neg    Spec Grav, UA 1.025    Blood, UA trace    pH, UA 5.5    Protein, UA neg    Urobilinogen, UA 0.2    Nitrite, UA neg    Leukocytes, UA small (1+) (A) Negative  GEN: WDWN, NAD, Non-toxic, A & O x 3 CV: RRR, No M/G/R. No JVD. No thrill. No extra heart sounds. PULM: CTA B, no wheezes, crackles, rhonchi. No retractions. No resp. distress. No accessory muscle use. ABD: S, NT, ND, +BS. No rebound. No HSM. Mild suprapubic pain  PSYCH: Normally interactive. Conversant. Not depressed or anxious appearing.  Calm demeanor.     Assessment and Plan: Urinary frequency - Plan: POCT UA - Microscopic Only, POCT urinalysis dipstick, nitrofurantoin, macrocrystal-monohydrate, (MACROBID) 100 MG capsule  Dysuria - Plan: POCT UA - Microscopic Only, POCT urinalysis dipstick, nitrofurantoin, macrocrystal-monohydrate, (MACROBID) 100 MG capsule  Acute cystitis without hematuria - Plan: nitrofurantoin, macrocrystal-monohydrate, (MACROBID) 100 MG capsule  UTI  Will tx with macrobid BID x  5 days Can use sx relief with AZO or pyridium  Signed Ben Adams-Doolittle, DO  Patient discussed and plan agreed upon  [employer]. Linna Darner M.D., attending

## 2015-07-25 ENCOUNTER — Encounter: Payer: Self-pay | Admitting: Internal Medicine

## 2015-07-25 MED ORDER — NONFORMULARY OR COMPOUNDED ITEM: Status: DC

## 2015-08-05 DIAGNOSIS — Z1231 Encounter for screening mammogram for malignant neoplasm of breast: Secondary | ICD-10-CM | POA: Diagnosis not present

## 2015-08-05 LAB — HM MAMMOGRAPHY

## 2015-08-09 ENCOUNTER — Other Ambulatory Visit: Payer: Self-pay | Admitting: Internal Medicine

## 2015-08-09 NOTE — Telephone Encounter (Signed)
Patient notified medication has been refilled.

## 2015-08-11 ENCOUNTER — Encounter: Payer: Self-pay | Admitting: Internal Medicine

## 2015-09-15 ENCOUNTER — Ambulatory Visit (INDEPENDENT_AMBULATORY_CARE_PROVIDER_SITE_OTHER): Payer: Medicare Other | Admitting: Family Medicine

## 2015-09-15 ENCOUNTER — Encounter: Payer: Self-pay | Admitting: Family Medicine

## 2015-09-15 VITALS — BP 136/80 | HR 85 | Temp 98.5°F | Ht 66.0 in | Wt 160.0 lb

## 2015-09-15 DIAGNOSIS — N390 Urinary tract infection, site not specified: Secondary | ICD-10-CM

## 2015-09-15 DIAGNOSIS — R319 Hematuria, unspecified: Secondary | ICD-10-CM | POA: Diagnosis not present

## 2015-09-15 LAB — POCT URINALYSIS DIPSTICK
NITRITE UA: POSITIVE
Spec Grav, UA: <=1.005
Urobilinogen, UA: >=8
pH, UA: 5

## 2015-09-15 MED ORDER — CIPROFLOXACIN HCL 500 MG PO TABS: 500.0000 mg | ORAL_TABLET | Freq: Two times a day (BID) | ORAL | Status: DC

## 2015-09-15 NOTE — Progress Notes (Signed)
Pre visit review using our clinic review tool, if applicable. No additional management support is needed unless otherwise documented below in the visit note. 

## 2015-09-15 NOTE — Progress Notes (Signed)
   Subjective:    Patient ID: Valerie Brady, female    DOB: 03/08/1946, 69 y.o.   MRN: [id number]  HPI Here for 2 days of urinary burning and urgency. No fever. Drinking plenty of water.    Review of Systems  Constitutional: Negative.   Respiratory: Negative.   Cardiovascular: Negative.   Gastrointestinal: Negative.   Genitourinary: Positive for dysuria, urgency and frequency. Negative for hematuria and flank pain.       Objective:   Physical Exam  Constitutional: She appears well-developed and well-nourished.  Cardiovascular: Normal rate, regular rhythm, normal heart sounds and intact distal pulses.   Pulmonary/Chest: Effort normal and breath sounds normal.  Abdominal: Soft. Bowel sounds are normal. She exhibits no distension. There is no rebound and no guarding.          Assessment & Plan:  UTI, treat with Cipro. Culture the sample

## 2015-09-18 LAB — URINE CULTURE: Colony Count: >=100000

## 2015-10-28 ENCOUNTER — Encounter: Payer: Self-pay | Admitting: Internal Medicine

## 2015-10-28 MED ORDER — NONFORMULARY OR COMPOUNDED ITEM: Status: DC

## 2015-10-28 NOTE — Telephone Encounter (Signed)
Called to the pharmacy and given to pharmacist. 

## 2015-11-04 MED ORDER — NONFORMULARY OR COMPOUNDED ITEM: Status: DC

## 2015-11-04 NOTE — Telephone Encounter (Signed)
Rx called in to [facility].

## 2015-11-04 NOTE — Addendum Note (Signed)
Addended by: Colleen Can on: 11/04/2015 10:06 AM   Modules accepted: Orders

## 2015-11-17 DIAGNOSIS — Z23 Encounter for immunization: Secondary | ICD-10-CM | POA: Diagnosis not present

## 2015-11-23 DIAGNOSIS — H02834 Dermatochalasis of left upper eyelid: Secondary | ICD-10-CM | POA: Diagnosis not present

## 2015-11-23 DIAGNOSIS — H02831 Dermatochalasis of right upper eyelid: Secondary | ICD-10-CM | POA: Diagnosis not present

## 2015-11-23 DIAGNOSIS — E119 Type 2 diabetes mellitus without complications: Secondary | ICD-10-CM | POA: Diagnosis not present

## 2015-11-23 DIAGNOSIS — H04123 Dry eye syndrome of bilateral lacrimal glands: Secondary | ICD-10-CM | POA: Diagnosis not present

## 2015-11-23 DIAGNOSIS — H2513 Age-related nuclear cataract, bilateral: Secondary | ICD-10-CM | POA: Diagnosis not present

## 2015-11-23 DIAGNOSIS — H40013 Open angle with borderline findings, low risk, bilateral: Secondary | ICD-10-CM | POA: Diagnosis not present

## 2015-11-23 LAB — HM DIABETES EYE EXAM

## 2015-11-28 ENCOUNTER — Encounter: Payer: Self-pay | Admitting: Family Medicine

## 2015-12-19 ENCOUNTER — Encounter: Payer: Self-pay | Admitting: Internal Medicine

## 2015-12-19 ENCOUNTER — Ambulatory Visit (INDEPENDENT_AMBULATORY_CARE_PROVIDER_SITE_OTHER): Payer: Medicare Other | Admitting: Internal Medicine

## 2015-12-19 VITALS — BP 130/82 | Temp 98.5°F | Ht 66.0 in | Wt 162.2 lb

## 2015-12-19 DIAGNOSIS — Z87440 Personal history of urinary (tract) infections: Secondary | ICD-10-CM

## 2015-12-19 DIAGNOSIS — Z7189 Other specified counseling: Secondary | ICD-10-CM

## 2015-12-19 DIAGNOSIS — Z79899 Other long term (current) drug therapy: Secondary | ICD-10-CM

## 2015-12-19 DIAGNOSIS — I10 Essential (primary) hypertension: Secondary | ICD-10-CM | POA: Diagnosis not present

## 2015-12-19 DIAGNOSIS — E785 Hyperlipidemia, unspecified: Secondary | ICD-10-CM

## 2015-12-19 DIAGNOSIS — R7303 Prediabetes: Secondary | ICD-10-CM

## 2015-12-19 DIAGNOSIS — R350 Frequency of micturition: Secondary | ICD-10-CM

## 2015-12-19 DIAGNOSIS — Z7184 Encounter for health counseling related to travel: Secondary | ICD-10-CM

## 2015-12-19 MED ORDER — NITROFURANTOIN MONOHYD MACRO 100 MG PO CAPS: 100.0000 mg | ORAL_CAPSULE | Freq: Two times a day (BID) | ORAL | Status: DC

## 2015-12-19 MED ORDER — PREDNISONE 20 MG PO TABS: ORAL_TABLET | ORAL | Status: DC

## 2015-12-19 MED ORDER — METRONIDAZOLE 500 MG PO TABS: 500.0000 mg | ORAL_TABLET | Freq: Three times a day (TID) | ORAL | Status: DC

## 2015-12-19 MED ORDER — ZOLPIDEM TARTRATE 5 MG PO TABS: 5.0000 mg | ORAL_TABLET | Freq: Every evening | ORAL | Status: DC | PRN

## 2015-12-19 MED ORDER — CIPROFLOXACIN HCL 500 MG PO TABS: 500.0000 mg | ORAL_TABLET | Freq: Two times a day (BID) | ORAL | Status: DC

## 2015-12-19 NOTE — Progress Notes (Signed)
Chief Complaint  Patient presents with  . Acute Visit    HPI: Patient Valerie Brady  comes in today for  Travel  And fu  new problem evaluation. She is going to [place] for about 3 weeks. Needs medication immunizations reviewed and updated. Would like to have an antibiotic in case she gets UTI overseas. Also in case she gets an acute reaction would like some prednisone but doesn't think she is going to needed for bee stings. Her sugars been pretty much in control. Would like a few Ambien 5 mg to help with travel. Was on it for long term and had a hard time with her draw getting off which is aware of side effects and think she will just use it for jet lag.  ROS: See pertinent positives and negatives per HPI.  Past Medical History  Diagnosis Date  . Hyperlipidemia   . Hypertension   . Hypothyroidism   . Gestational diabetes   . Carotidynia   . Syncope 2011    reaction to benicar  . Diverticulitis of colon 05/22/2015    Family History  Problem Relation Age of Onset  . Hypertension Mother   . Dementia Mother   . Heart disease Father   . Dementia Father   . Diabetes Father   . Hypertension Brother     Social History   Social History  . Marital Status: Married    Spouse Name: N/A  . Number of Children: N/A  . Years of Education: N/A   Social History Main Topics  . Smoking status: Never Smoker   . Smokeless tobacco: Never Used  . Alcohol Use: 0.0 oz/week    0 Standard drinks or equivalent per week     Comment: glass of wine each day  . Drug Use: No  . Sexual Activity: No   Other Topics Concern  . 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 of 5   Pet cat   Mom with dementia    Outpatient Prescriptions Prior to Visit  Medication Sig Dispense Refill  . gabapentin (NEURONTIN) 300 MG capsule TAKE 1 CAPSULE DAILY AND INCREASE TO 3 CAPSULES DAILY.  90 capsule 0  . glucose blood (TRUETEST TEST) test strip Use to test blood sugar once daily 100 each 3  . Lancets MISC Use to test blood sugar once daily 100 each 3  . levothyroxine (SYNTHROID, LEVOTHROID) 112 MCG tablet Take 1 tablet (112 mcg total) by mouth daily. 90 tablet 3  . Melatonin 5 MG TABS Take 5 mg by mouth at bedtime.    . metFORMIN (GLUCOPHAGE-XR) 500 MG 24 hr tablet Take 1 tablet (500 mg total) by mouth 2 (two) times daily. 180 tablet 3  . NONFORMULARY OR COMPOUNDED ITEM Estriol 0.5mg /ml Cream: Insert 1 vaginally three times weekly 36 each 0  . PRESCRIPTION MEDICATION Generic Glucometer and test strips.  Lancets if needed.    . ciprofloxacin (CIPRO) 500 MG tablet Take 1 tablet (500 mg total) by mouth 2 (two) times daily. 14 tablet 0  . metroNIDAZOLE (FLAGYL) 500 MG tablet Take 1 tablet (500 mg total) by mouth 3 (three) times daily. 30 tablet 0  . nitrofurantoin, macrocrystal-monohydrate, (MACROBID) 100 MG capsule Take 1 capsule (100 mg total) by mouth 2 (two) times daily. 10 capsule 0  . D-MANNOSE PO Take  2 g by mouth daily. Reported on 12/19/2015     No facility-administered medications prior to visit.     EXAM:  BP 130/82 mmHg  Temp(Src) 98.5 F (36.9 C) (Oral)  Ht 5\' 6"  (1.676 m)  Wt 162 lb 3.2 oz (73.573 kg)  BMI 26.19 kg/m2  Body mass index is 26.19 kg/(m^2).  GENERAL: vitals reviewed and listed above, alert, oriented, appears well hydrated and in no acute distress HEENT: atraumatic, conjunctiva  clear, no obvious abnormalities on inspection of external nose and ears PSYCH: pleasant and cooperative, no obvious depression or anxiety   ASSESSMENT AND PLAN:  Discussed the following assessment and plan:  Medication management  Essential hypertension  Hyperlipidemia  Counseling about travel  Pre-diabetes  Urinary frequency - Plan: nitrofurantoin, macrocrystal-monohydrate, (MACROBID) 100 MG capsule Due for labs in April plan full set of labs after she comes  back from her trip monitoring her blood sugar etc. -Patient advised to return or notify health care team  if symptoms worsen ,persist or new concerns arise.   There are no Patient Instructions on file for this visit.   Standley Brooking. Panosh M.D.

## 2015-12-21 ENCOUNTER — Encounter: Payer: Self-pay | Admitting: Family Medicine

## 2015-12-21 NOTE — Patient Instructions (Signed)
Make appt for labs and wellness  Continue lifestyle intervention healthy eating and exercise . utd on immunizations

## 2015-12-23 ENCOUNTER — Telehealth: Payer: Self-pay | Admitting: Internal Medicine

## 2015-12-23 MED ORDER — NONFORMULARY OR COMPOUNDED ITEM: Status: DC

## 2015-12-23 NOTE — Telephone Encounter (Signed)
Pt request refill of the following:     Estradiol vagina CREAM   Pt said she has been getting this fill and last had this fill 12/16     Phamacy: [facility]

## 2015-12-23 NOTE — Telephone Encounter (Signed)
Called into the pharmacy.  See MyChart message.

## 2016-02-26 NOTE — Progress Notes (Signed)
Chief Complaint  Patient presents with  . Medicare Wellness    HPI: Valerie Brady 70 y.o. comes in today for Preventive Medicare wellness visit  And Chronic disease management.   bg: 89  122   Range no lows   100 this am .   1000 metformin tolerating  Trip to [place] did well loist 8 pounds  And ate healthy  No vision hearing changes  Or memory changes  Due for soon soon  Per Medoff   hx recurrent uti none recently  bp controlled   Thyroid no change med no hair change had 1 day of edema on travel  Health Maintenance  Topic Date Due  . Hepatitis C Screening  1946/04/18  . FOOT EXAM  10/27/1957  . DEXA SCAN  10/27/2012  . PNA vac Low Risk Adult (2 of 2 - PCV13) 06/09/2014  . INFLUENZA VACCINE  06/12/2016  . HEMOGLOBIN A1C  08/28/2016  . COLONOSCOPY  11/14/2016  . OPHTHALMOLOGY EXAM  11/22/2016  . MAMMOGRAM  08/04/2017  . TETANUS/TDAP  03/05/2024  . 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    MEDICARE DOCUMENT QUESTIONS  TO SCAN   Hearing:  [place]   Some voice discrimination  Difficult .  No sig change  Vision:  No limitations at present . Last eye check UTD  Safety:  Has smoke detector and wears seat belts.  No firearms. No excess sun exposure. Sees dentist regularly.  Falls: slipped on wet driveway in flip flops no sig injury  Traveled to [place] for 3 weeks and did well  Advance directive :  Reviewed  Has one.  Memory: Felt to be good  , no concern from her or her family. ocass work finding   Depression: No anhedonia unusual crying or depressive symptoms  Nutrition: Eats well balanced diet; adequate calcium and vitamin D. No swallowing chewing problems.  Injury: no major injuries in the last six months.  Other healthcare providers:  Reviewed today .  Social:  Lives with spouse married. Daughter 3     Preventive  parameters: up-to-date  Reviewed   ADLS:   There are no problems or need for assistance  driving, feeding, obtaining food, dressing, toileting and bathing, managing money using phone. She is independent.    ROS:  GEN/ HEENT: No fever, significant weight changes sweats headaches vision problems hearing changes, CV/ PULM; No chest pain shortness of breath cough, syncope,edema  change in exercise tolerance. GI /GU: No adominal pain, vomiting, change in bowel habits. No blood in the stool. No significant GU symptoms. SKIN/HEME: ,no acute skin rashes suspicious lesions or bleeding. No lymphadenopathy, nodules, masses.  NEURO/ PSYCH:  No neurologic signs such as weakness numbness. No depression anxiety. IMM/ Allergy: No unusual infections.  Allergy .   REST of 12 system review negative except as per HPI   Past Medical History  Diagnosis Date  . Hyperlipidemia   . Hypertension   . Hypothyroidism   . Gestational diabetes   . Carotidynia   . Syncope 2011    reaction to benicar  . Diverticulitis of colon 05/22/2015    Family History  Problem Relation Age of Onset  . Hypertension Mother   . Dementia Mother   . Heart disease Father   . Dementia Father   .  Diabetes Father   . Hypertension Brother     Social History   Social History  . Marital Status: Married    Spouse Name: N/A  . Number of Children: N/A  . Years of Education: N/A   Social History Main Topics  . Smoking status: Never Smoker   . Smokeless tobacco: Never Used  . Alcohol Use: 0.0 oz/week    0 Standard drinks or equivalent per week     Comment: glass of wine each day  . Drug Use: No  . Sexual Activity: No   Other Topics Concern  . 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  cholecalciferol (VITAMIN D) 1000 units tablet Take 1,000 Units by mouth daily.  . Cyanocobalamin (B-12 PO) Take by mouth.  Marland Kitchen glucose blood (TRUETEST TEST) test strip Use to test blood sugar once daily  . Lancets MISC Use to test blood sugar once daily  . levothyroxine (SYNTHROID, LEVOTHROID) 112 MCG tablet Take 1 tablet (112 mcg total) by mouth daily.  . Melatonin 5 MG TABS Take 5 mg by mouth at bedtime.  . metFORMIN (GLUCOPHAGE-XR) 500 MG 24 hr tablet Take 1 tablet (500 mg total) by mouth 2 (two) times daily.  . NONFORMULARY OR COMPOUNDED ITEM Estriol 0.7m/ml Cream: Insert 1 vaginally three times weekly  . PRESCRIPTION MEDICATION Generic Glucometer and test strips.  Lancets if needed.  . [DISCONTINUED] D-MANNOSE PO Take 2 g by mouth daily. Reported on 12/19/2015  . [DISCONTINUED] gabapentin (NEURONTIN) 300 MG capsule TAKE 1 CAPSULE DAILY AND INCREASE TO 3 CAPSULES DAILY.  . [DISCONTINUED] zolpidem (AMBIEN) 5 MG tablet Take 1 tablet (5 mg total) by mouth at bedtime as needed for sleep.  . [DISCONTINUED] ciprofloxacin (CIPRO) 500 MG tablet Take 1 tablet (500 mg total) by mouth 2 (two) times daily.  . [DISCONTINUED] metroNIDAZOLE (FLAGYL) 500 MG tablet Take 1 tablet (500 mg total) by mouth 3 (three) times daily.  . [DISCONTINUED] nitrofurantoin, macrocrystal-monohydrate, (MACROBID) 100 MG capsule Take 1 capsule (100 mg total) by mouth 2 (two) times daily.  . [DISCONTINUED] predniSONE (DELTASONE) 20 MG tablet Take 3 po qd for 2 days then 2 po qd for 3 days,or as directed   No facility-administered encounter medications on file as of 02/27/2016.    EXAM:  BP 130/70 mmHg  Temp(Src) 97.7 F (36.5 C) (Oral)  Ht 5' 5.25" (1.657 m)  Wt 156 lb 9.6 oz (71.033 kg)  BMI 25.87 kg/m2  Body mass index is 25.87 kg/(m^2).  Physical Exam: Vital signs reviewed [id number] a well-developed well-nourished alert cooperative   who appears stated age in no acute distress.  HEENT: normocephalic atraumatic ,  Eyes: PERRL EOM's full, conjunctiva clear, Nares: paten,t no deformity discharge or tenderness., Ears: no deformity EAC's clear TMs with normal landmarks. Mouth: clear OP, no lesions, edema.  Moist mucous membranes. Dentition in adequate repair. NECK: supple without masses, thyromegaly or bruits. CHEST/PULM:  Clear to auscultation and percussion breath sounds equal no wheeze , rales or rhonchi. No chest wall deformities or tenderness. CV: PMI is nondisplaced, S1 S2 no gallops, murmurs, rubs. Peripheral pulses are full without delay.No JVD . Breast: normal by inspection .  No dimpling, discharge, masses, tenderness or discharge . ABDOMEN: Bowel sounds normal nontender  No guard or rebound, no hepato splenomegal no CVA tenderness.   Extremtities:  No clubbing cyanosis or edema, no acute joint swelling or redness no focal atrophy NEURO:  Oriented x3, cranial nerves 3-12 appear to be intact, no obvious focal weakness,gait within normal limits no abnormal reflexes or asymmetrical SKIN: No acute rashes normal turgor, color, no bruising or petechiae. PSYCH: Oriented, good eye contact, no obvious depression anxiety, cognition and judgment appear normal. LN: no cervical axillary inguinal adenopathy No noted deficits in memory, attention, and speech. Diabetic Foot Exam - Simple   Simple Foot Form  Visual Inspection  No deformities, no ulcerations, no other skin breakdown bilaterally:  Yes  Sensation Testing  Intact to touch and monofilament testing bilaterally:  Yes  See comments:  Yes  Pulse Check  Posterior Tibialis and Dorsalis pulse intact bilaterally:  Yes  Comments  Second toe raised no  lesion        BP Readings from Last 3 Encounters:  02/27/16 130/70  12/19/15 130/82  09/15/15 136/80   Wt Readings from Last 3 Encounters:  02/27/16 156 lb 9.6 oz (71.033 kg)  12/19/15 162 lb 3.2 oz (73.573 kg)  09/15/15 160 lb (72.576 kg)    ASSESSMENT AND PLAN:  Discussed the following assessment  and plan:  Medicare annual wellness visit, subsequent  Medication management - Plan: Basic metabolic panel, Hemoglobin A1c, Lipid panel, TSH, Hepatic function panel, Microalbumin / creatinine urine ratio  Essential hypertension - controlled lse cant take ace arb - Plan: Basic metabolic panel, Hemoglobin A1c, Lipid panel, TSH, Hepatic function panel, Microalbumin / creatinine urine ratio  Hyperlipidemia - statin intolerant  - Plan: Basic metabolic panel, Hemoglobin A1c, Lipid panel, TSH, Hepatic function panel, Microalbumin / creatinine urine ratio  Pre-diabetes now in the diabetes range  - at goal  - Plan: Basic metabolic panel, Hemoglobin A1c, Lipid panel, TSH, Hepatic function panel, Microalbumin / creatinine urine ratio  Diabetes mellitus without complication (HCC)  Hypothyroidism, unspecified hypothyroidism type - monitor tsh - Plan: Basic metabolic panel, Hemoglobin A1c, Lipid panel, TSH, Hepatic function panel, Microalbumin / creatinine urine ratio  History of recurrent UTIs Due for labs  Patient Care Team: Burnis Medin, MD as PCP - General Richmond Campbell, MD (Gastroenterology) Clent Jacks, MD as Consulting Physician (Ophthalmology)  Patient Instructions  Can do  dexa next year.   Will notify you  of labs when available. Glad you are doing well.     Health Maintenance, Female Adopting a healthy lifestyle and getting preventive care can go a long way to promote health and wellness. Talk with your health care provider about what schedule of regular examinations is right for you. This is a good chance for you to check in with your provider about disease prevention and staying healthy. In between checkups, there are plenty of things you can do on your own. Experts have done a lot of research about which lifestyle changes and preventive measures are most likely to keep you healthy. Ask your health care provider for more information. WEIGHT AND DIET  Eat a healthy diet  Be sure  to include plenty of vegetables, fruits, low-fat dairy products, and lean protein.  Do not eat a lot of foods high in solid fats, added sugars, or salt.  Get regular exercise. This is one of the most important things you can do for your health.  Most adults should exercise for at  least 150 minutes each week. The exercise should increase your heart rate and make you sweat (moderate-intensity exercise).  Most adults should also do strengthening exercises at least twice a week. This is in addition to the moderate-intensity exercise.  Maintain a healthy weight  Body mass index (BMI) is a measurement that can be used to identify possible weight problems. It estimates body fat based on height and weight. Your health care provider can help determine your BMI and help you achieve or maintain a healthy weight.  For females 61 years of age and older:   A BMI below 18.5 is considered underweight.  A BMI of 18.5 to 24.9 is normal.  A BMI of 25 to 29.9 is considered overweight.  A BMI of 30 and above is considered obese.  Watch levels of cholesterol and blood lipids  You should start having your blood tested for lipids and cholesterol at 69 years of age, then have this test every 5 years.  You may need to have your cholesterol levels checked more often if:  Your lipid or cholesterol levels are high.  You are older than 69 years of age.  You are at high risk for heart disease.  CANCER SCREENING   Lung Cancer  Lung cancer screening is recommended for adults 38-30 years old who are at high risk for lung cancer because of a history of smoking.  A yearly low-dose CT scan of the lungs is recommended for people who:  Currently smoke.  Have quit within the past 15 years.  Have at least a 30-pack-year history of smoking. A pack year is smoking an average of one pack of cigarettes a day for 1 year.  Yearly screening should continue until it has been 15 years since you quit.  Yearly  screening should stop if you develop a health problem that would prevent you from having lung cancer treatment.  Breast Cancer  Practice breast self-awareness. This means understanding how your breasts normally appear and feel.  It also means doing regular breast self-exams. Let your health care provider know about any changes, no matter how small.  If you are in your 20s or 30s, you should have a clinical breast exam (CBE) by a health care provider every 1-3 years as part of a regular health exam.  If you are 44 or older, have a CBE every year. Also consider having a breast X-ray (mammogram) every year.  If you have a family history of breast cancer, talk to your health care provider about genetic screening.  If you are at high risk for breast cancer, talk to your health care provider about having an MRI and a mammogram every year.  Breast cancer gene (BRCA) assessment is recommended for women who have family members with BRCA-related cancers. BRCA-related cancers include:  Breast.  Ovarian.  Tubal.  Peritoneal cancers.  Results of the assessment will determine the need for genetic counseling and BRCA1 and BRCA2 testing. Cervical Cancer Your health care provider may recommend that you be screened regularly for cancer of the pelvic organs (ovaries, uterus, and vagina). This screening involves a pelvic examination, including checking for microscopic changes to the surface of your cervix (Pap test). You may be encouraged to have this screening done every 3 years, beginning at age 51.  For women ages 44-65, health care providers may recommend pelvic exams and Pap testing every 3 years, or they may recommend the Pap and pelvic exam, combined with testing for human papilloma virus (HPV), every 5  years. Some types of HPV increase your risk of cervical cancer. Testing for HPV may also be done on women of any age with unclear Pap test results.  Other health care providers may not recommend  any screening for nonpregnant women who are considered low risk for pelvic cancer and who do not have symptoms. Ask your health care provider if a screening pelvic exam is right for you.  If you have had past treatment for cervical cancer or a condition that could lead to cancer, you need Pap tests and screening for cancer for at least 20 years after your treatment. If Pap tests have been discontinued, your risk factors (such as having a new sexual partner) need to be reassessed to determine if screening should resume. Some women have medical problems that increase the chance of getting cervical cancer. In these cases, your health care provider may recommend more frequent screening and Pap tests. Colorectal Cancer  This type of cancer can be detected and often prevented.  Routine colorectal cancer screening usually begins at 69 years of age and continues through 69 years of age.  Your health care provider may recommend screening at an earlier age if you have risk factors for colon cancer.  Your health care provider may also recommend using home test kits to check for hidden blood in the stool.  A small camera at the end of a tube can be used to examine your colon directly (sigmoidoscopy or colonoscopy). This is done to check for the earliest forms of colorectal cancer.  Routine screening usually begins at age 13.  Direct examination of the colon should be repeated every 5-10 years through 69 years of age. However, you may need to be screened more often if early forms of precancerous polyps or small growths are found. Skin Cancer  Check your skin from head to toe regularly.  Tell your health care provider about any new moles or changes in moles, especially if there is a change in a mole's shape or color.  Also tell your health care provider if you have a mole that is larger than the size of a pencil eraser.  Always use sunscreen. Apply sunscreen liberally and repeatedly throughout the  day.  Protect yourself by wearing long sleeves, pants, a wide-brimmed hat, and sunglasses whenever you are outside. HEART DISEASE, DIABETES, AND HIGH BLOOD PRESSURE   High blood pressure causes heart disease and increases the risk of stroke. High blood pressure is more likely to develop in:  People who have blood pressure in the high end of the normal range (130-139/85-89 mm Hg).  People who are overweight or obese.  People who are African American.  If you are 58-25 years of age, have your blood pressure checked every 3-5 years. If you are 82 years of age or older, have your blood pressure checked every year. You should have your blood pressure measured twice--once when you are at a hospital or clinic, and once when you are not at a hospital or clinic. Record the average of the two measurements. To check your blood pressure when you are not at a hospital or clinic, you can use:  An automated blood pressure machine at a pharmacy.  A home blood pressure monitor.  If you are between 12 years and 46 years old, ask your health care provider if you should take aspirin to prevent strokes.  Have regular diabetes screenings. This involves taking a blood sample to check your fasting blood sugar level.  If you  are at a normal weight and have a low risk for diabetes, have this test once every three years after 69 years of age.  If you are overweight and have a high risk for diabetes, consider being tested at a younger age or more often. PREVENTING INFECTION  Hepatitis B  If you have a higher risk for hepatitis B, you should be screened for this virus. You are considered at high risk for hepatitis B if:  You were born in a country where hepatitis B is common. Ask your health care provider which countries are considered high risk.  Your parents were born in a high-risk country, and you have not been immunized against hepatitis B (hepatitis B vaccine).  You have HIV or AIDS.  You use needles  to inject street drugs.  You live with someone who has hepatitis B.  You have had sex with someone who has hepatitis B.  You get hemodialysis treatment.  You take certain medicines for conditions, including cancer, organ transplantation, and autoimmune conditions. Hepatitis C  Blood testing is recommended for:  Everyone born from [birth year] through 1964.  Anyone with known risk factors for hepatitis C. Sexually transmitted infections (STIs)  You should be screened for sexually transmitted infections (STIs) including gonorrhea and chlamydia if:  You are sexually active and are younger than 69 years of age.  You are older than 69 years of age and your health care provider tells you that you are at risk for this type of infection.  Your sexual activity has changed since you were last screened and you are at an increased risk for chlamydia or gonorrhea. Ask your health care provider if you are at risk.  If you do not have HIV, but are at risk, it may be recommended that you take a prescription medicine daily to prevent HIV infection. This is called pre-exposure prophylaxis (PrEP). You are considered at risk if:  You are sexually active and do not regularly use condoms or know the HIV status of your partner(s).  You take drugs by injection.  You are sexually active with a partner who has HIV. Talk with your health care provider about whether you are at high risk of being infected with HIV. If you choose to begin PrEP, you should first be tested for HIV. You should then be tested every 3 months for as long as you are taking PrEP.  PREGNANCY   If you are premenopausal and you may become pregnant, ask your health care provider about preconception counseling.  If you may become pregnant, take 400 to 800 micrograms (mcg) of folic acid every day.  If you want to prevent pregnancy, talk to your health care provider about birth control (contraception). OSTEOPOROSIS AND MENOPAUSE    Osteoporosis is a disease in which the bones lose minerals and strength with aging. This can result in serious bone fractures. Your risk for osteoporosis can be identified using a bone density scan.  If you are 58 years of age or older, or if you are at risk for osteoporosis and fractures, ask your health care provider if you should be screened.  Ask your health care provider whether you should take a calcium or vitamin D supplement to lower your risk for osteoporosis.  Menopause may have certain physical symptoms and risks.  Hormone replacement therapy may reduce some of these symptoms and risks. Talk to your health care provider about whether hormone replacement therapy is right for you.  HOME CARE INSTRUCTIONS   Schedule  regular health, dental, and eye exams.  Stay current with your immunizations.   Do not use any tobacco products including cigarettes, chewing tobacco, or electronic cigarettes.  If you are pregnant, do not drink alcohol.  If you are breastfeeding, limit how much and how often you drink alcohol.  Limit alcohol intake to no more than 1 drink per day for nonpregnant women. One drink equals 12 ounces of beer, 5 ounces of wine, or 1 ounces of hard liquor.  Do not use street drugs.  Do not share needles.  Ask your health care provider for help if you need support or information about quitting drugs.  Tell your health care provider if you often feel depressed.  Tell your health care provider if you have ever been abused or do not feel safe at home.   This information is not intended to replace advice given to you by your health care provider. Make sure you discuss any questions you have with your health care provider.   Document Released: 05/14/2011 Document Revised: 11/19/2014 Document Reviewed: 09/30/2013 Elsevier Interactive Patient Education 2016 [place] K. Daanish Copes M.D.   Lab Results  Component Value Date   WBC 10.0 02/14/2015    HGB 13.7 02/14/2015   HCT 40.4 02/14/2015   PLT 382.0 02/14/2015   GLUCOSE 111* 02/27/2016   CHOL 256* 02/27/2016   TRIG 137.0 02/27/2016   HDL 54.60 02/27/2016   LDLDIRECT 182.0 02/14/2015   LDLCALC 174* 02/27/2016   ALT 8 02/27/2016   AST 12 02/27/2016   NA 139 02/27/2016   K 4.3 02/27/2016   CL 105 02/27/2016   CREATININE 0.76 02/27/2016   BUN 14 02/27/2016   CO2 25 02/27/2016   TSH 1.52 02/27/2016   HGBA1C 6.6* 02/27/2016   MICROALBUR <0.7 02/27/2016

## 2016-02-27 ENCOUNTER — Ambulatory Visit (INDEPENDENT_AMBULATORY_CARE_PROVIDER_SITE_OTHER): Payer: Medicare Other | Admitting: Internal Medicine

## 2016-02-27 ENCOUNTER — Encounter: Payer: Self-pay | Admitting: Internal Medicine

## 2016-02-27 VITALS — BP 130/70 | Temp 97.7°F | Ht 65.25 in | Wt 156.6 lb

## 2016-02-27 DIAGNOSIS — I10 Essential (primary) hypertension: Secondary | ICD-10-CM | POA: Diagnosis not present

## 2016-02-27 DIAGNOSIS — E785 Hyperlipidemia, unspecified: Secondary | ICD-10-CM | POA: Diagnosis not present

## 2016-02-27 DIAGNOSIS — Z0000 Encounter for general adult medical examination without abnormal findings: Secondary | ICD-10-CM

## 2016-02-27 DIAGNOSIS — Z79899 Other long term (current) drug therapy: Secondary | ICD-10-CM | POA: Diagnosis not present

## 2016-02-27 DIAGNOSIS — E039 Hypothyroidism, unspecified: Secondary | ICD-10-CM

## 2016-02-27 DIAGNOSIS — R7303 Prediabetes: Secondary | ICD-10-CM | POA: Diagnosis not present

## 2016-02-27 DIAGNOSIS — Z87440 Personal history of urinary (tract) infections: Secondary | ICD-10-CM

## 2016-02-27 DIAGNOSIS — E119 Type 2 diabetes mellitus without complications: Secondary | ICD-10-CM

## 2016-02-27 LAB — BASIC METABOLIC PANEL WITH GFR
BUN: 14 mg/dL (ref 6–23)
CO2: 25 meq/L (ref 19–32)
Calcium: 9.4 mg/dL (ref 8.4–10.5)
Chloride: 105 meq/L (ref 96–112)
Creatinine, Ser: 0.76 mg/dL (ref 0.40–1.20)
GFR: 80.36 mL/min
Glucose, Bld: 111 mg/dL — ABNORMAL HIGH (ref 70–99)
Potassium: 4.3 meq/L (ref 3.5–5.1)
Sodium: 139 meq/L (ref 135–145)

## 2016-02-27 LAB — LIPID PANEL
Cholesterol: 256 mg/dL — ABNORMAL HIGH (ref 0–200)
HDL: 54.6 mg/dL (ref >39.00)
LDL Cholesterol: 174 mg/dL — ABNORMAL HIGH (ref 0–99)
NONHDL: 201.07
Total CHOL/HDL Ratio: 5
Triglycerides: 137 mg/dL (ref 0.0–149.0)
VLDL: 27.4 mg/dL (ref 0.0–40.0)

## 2016-02-27 LAB — HEPATIC FUNCTION PANEL
ALT: 8 U/L (ref 0–35)
AST: 12 U/L (ref 0–37)
Albumin: 4.2 g/dL (ref 3.5–5.2)
Alkaline Phosphatase: 32 U/L — ABNORMAL LOW (ref 39–117)
BILIRUBIN DIRECT: 0.1 mg/dL (ref 0.0–0.3)
BILIRUBIN TOTAL: 0.5 mg/dL (ref 0.2–1.2)
Total Protein: 6.8 g/dL (ref 6.0–8.3)

## 2016-02-27 LAB — TSH: TSH: 1.52 u[IU]/mL (ref 0.35–4.50)

## 2016-02-27 LAB — MICROALBUMIN / CREATININE URINE RATIO
Creatinine,U: 123.1 mg/dL
Microalb Creat Ratio: 0.6 mg/g (ref 0.0–30.0)
Microalb, Ur: <0.7 mg/dL (ref 0.0–1.9)

## 2016-02-27 LAB — HEMOGLOBIN A1C: Hgb A1c MFr Bld: 6.6 % — ABNORMAL HIGH (ref 4.6–6.5)

## 2016-02-27 NOTE — Patient Instructions (Addendum)
Can do  dexa next year.   Will notify you  of labs when available. Glad you are doing well.     Health Maintenance, Female Adopting a healthy lifestyle and getting preventive care can go a long way to promote health and wellness. Talk with your health care provider about what schedule of regular examinations is right for you. This is a good chance for you to check in with your provider about disease prevention and staying healthy. In between checkups, there are plenty of things you can do on your own. Experts have done a lot of research about which lifestyle changes and preventive measures are most likely to keep you healthy. Ask your health care provider for more information. WEIGHT AND DIET  Eat a healthy diet  Be sure to include plenty of vegetables, fruits, low-fat dairy products, and lean protein.  Do not eat a lot of foods high in solid fats, added sugars, or salt.  Get regular exercise. This is one of the most important things you can do for your health.  Most adults should exercise for at least 150 minutes each week. The exercise should increase your heart rate and make you sweat (moderate-intensity exercise).  Most adults should also do strengthening exercises at least twice a week. This is in addition to the moderate-intensity exercise.  Maintain a healthy weight  Body mass index (BMI) is a measurement that can be used to identify possible weight problems. It estimates body fat based on height and weight. Your health care provider can help determine your BMI and help you achieve or maintain a healthy weight.  For females 63 years of age and older:   A BMI below 18.5 is considered underweight.  A BMI of 18.5 to 24.9 is normal.  A BMI of 25 to 29.9 is considered overweight.  A BMI of 30 and above is considered obese.  Watch levels of cholesterol and blood lipids  You should start having your blood tested for lipids and cholesterol at 69 years of age, then have this test  every 5 years.  You may need to have your cholesterol levels checked more often if:  Your lipid or cholesterol levels are high.  You are older than 69 years of age.  You are at high risk for heart disease.  CANCER SCREENING   Lung Cancer  Lung cancer screening is recommended for adults 57-73 years old who are at high risk for lung cancer because of a history of smoking.  A yearly low-dose CT scan of the lungs is recommended for people who:  Currently smoke.  Have quit within the past 15 years.  Have at least a 30-pack-year history of smoking. A pack year is smoking an average of one pack of cigarettes a day for 1 year.  Yearly screening should continue until it has been 15 years since you quit.  Yearly screening should stop if you develop a health problem that would prevent you from having lung cancer treatment.  Breast Cancer  Practice breast self-awareness. This means understanding how your breasts normally appear and feel.  It also means doing regular breast self-exams. Let your health care provider know about any changes, no matter how small.  If you are in your 20s or 30s, you should have a clinical breast exam (CBE) by a health care provider every 1-3 years as part of a regular health exam.  If you are 50 or older, have a CBE every year. Also consider having a breast X-ray (  mammogram) every year.  If you have a family history of breast cancer, talk to your health care provider about genetic screening.  If you are at high risk for breast cancer, talk to your health care provider about having an MRI and a mammogram every year.  Breast cancer gene (BRCA) assessment is recommended for women who have family members with BRCA-related cancers. BRCA-related cancers include:  Breast.  Ovarian.  Tubal.  Peritoneal cancers.  Results of the assessment will determine the need for genetic counseling and BRCA1 and BRCA2 testing. Cervical Cancer Your health care provider  may recommend that you be screened regularly for cancer of the pelvic organs (ovaries, uterus, and vagina). This screening involves a pelvic examination, including checking for microscopic changes to the surface of your cervix (Pap test). You may be encouraged to have this screening done every 3 years, beginning at age 56.  For women ages 67-65, health care providers may recommend pelvic exams and Pap testing every 3 years, or they may recommend the Pap and pelvic exam, combined with testing for human papilloma virus (HPV), every 5 years. Some types of HPV increase your risk of cervical cancer. Testing for HPV may also be done on women of any age with unclear Pap test results.  Other health care providers may not recommend any screening for nonpregnant women who are considered low risk for pelvic cancer and who do not have symptoms. Ask your health care provider if a screening pelvic exam is right for you.  If you have had past treatment for cervical cancer or a condition that could lead to cancer, you need Pap tests and screening for cancer for at least 20 years after your treatment. If Pap tests have been discontinued, your risk factors (such as having a new sexual partner) need to be reassessed to determine if screening should resume. Some women have medical problems that increase the chance of getting cervical cancer. In these cases, your health care provider may recommend more frequent screening and Pap tests. Colorectal Cancer  This type of cancer can be detected and often prevented.  Routine colorectal cancer screening usually begins at 69 years of age and continues through 69 years of age.  Your health care provider may recommend screening at an earlier age if you have risk factors for colon cancer.  Your health care provider may also recommend using home test kits to check for hidden blood in the stool.  A small camera at the end of a tube can be used to examine your colon directly  (sigmoidoscopy or colonoscopy). This is done to check for the earliest forms of colorectal cancer.  Routine screening usually begins at age 31.  Direct examination of the colon should be repeated every 5-10 years through 68 years of age. However, you may need to be screened more often if early forms of precancerous polyps or small growths are found. Skin Cancer  Check your skin from head to toe regularly.  Tell your health care provider about any new moles or changes in moles, especially if there is a change in a mole's shape or color.  Also tell your health care provider if you have a mole that is larger than the size of a pencil eraser.  Always use sunscreen. Apply sunscreen liberally and repeatedly throughout the day.  Protect yourself by wearing long sleeves, pants, a wide-brimmed hat, and sunglasses whenever you are outside. HEART DISEASE, DIABETES, AND HIGH BLOOD PRESSURE   High blood pressure causes heart disease  and increases the risk of stroke. High blood pressure is more likely to develop in:  People who have blood pressure in the high end of the normal range (130-139/85-89 mm Hg).  People who are overweight or obese.  People who are African American.  If you are 5-58 years of age, have your blood pressure checked every 3-5 years. If you are 32 years of age or older, have your blood pressure checked every year. You should have your blood pressure measured twice--once when you are at a hospital or clinic, and once when you are not at a hospital or clinic. Record the average of the two measurements. To check your blood pressure when you are not at a hospital or clinic, you can use:  An automated blood pressure machine at a pharmacy.  A home blood pressure monitor.  If you are between 84 years and 54 years old, ask your health care provider if you should take aspirin to prevent strokes.  Have regular diabetes screenings. This involves taking a blood sample to check your  fasting blood sugar level.  If you are at a normal weight and have a low risk for diabetes, have this test once every three years after 69 years of age.  If you are overweight and have a high risk for diabetes, consider being tested at a younger age or more often. PREVENTING INFECTION  Hepatitis B  If you have a higher risk for hepatitis B, you should be screened for this virus. You are considered at high risk for hepatitis B if:  You were born in a country where hepatitis B is common. Ask your health care provider which countries are considered high risk.  Your parents were born in a high-risk country, and you have not been immunized against hepatitis B (hepatitis B vaccine).  You have HIV or AIDS.  You use needles to inject street drugs.  You live with someone who has hepatitis B.  You have had sex with someone who has hepatitis B.  You get hemodialysis treatment.  You take certain medicines for conditions, including cancer, organ transplantation, and autoimmune conditions. Hepatitis C  Blood testing is recommended for:  Everyone born from [birth year] through 1964.  Anyone with known risk factors for hepatitis C. Sexually transmitted infections (STIs)  You should be screened for sexually transmitted infections (STIs) including gonorrhea and chlamydia if:  You are sexually active and are younger than 69 years of age.  You are older than 69 years of age and your health care provider tells you that you are at risk for this type of infection.  Your sexual activity has changed since you were last screened and you are at an increased risk for chlamydia or gonorrhea. Ask your health care provider if you are at risk.  If you do not have HIV, but are at risk, it may be recommended that you take a prescription medicine daily to prevent HIV infection. This is called pre-exposure prophylaxis (PrEP). You are considered at risk if:  You are sexually active and do not regularly use condoms or  know the HIV status of your partner(s).  You take drugs by injection.  You are sexually active with a partner who has HIV. Talk with your health care provider about whether you are at high risk of being infected with HIV. If you choose to begin PrEP, you should first be tested for HIV. You should then be tested every 3 months for as long as you are taking PrEP.  PREGNANCY   If you are premenopausal and you may become pregnant, ask your health care provider about preconception counseling.  If you may become pregnant, take 400 to 800 micrograms (mcg) of folic acid every day.  If you want to prevent pregnancy, talk to your health care provider about birth control (contraception). OSTEOPOROSIS AND MENOPAUSE   Osteoporosis is a disease in which the bones lose minerals and strength with aging. This can result in serious bone fractures. Your risk for osteoporosis can be identified using a bone density scan.  If you are 31 years of age or older, or if you are at risk for osteoporosis and fractures, ask your health care provider if you should be screened.  Ask your health care provider whether you should take a calcium or vitamin D supplement to lower your risk for osteoporosis.  Menopause may have certain physical symptoms and risks.  Hormone replacement therapy may reduce some of these symptoms and risks. Talk to your health care provider about whether hormone replacement therapy is right for you.  HOME CARE INSTRUCTIONS   Schedule regular health, dental, and eye exams.  Stay current with your immunizations.   Do not use any tobacco products including cigarettes, chewing tobacco, or electronic cigarettes.  If you are pregnant, do not drink alcohol.  If you are breastfeeding, limit how much and how often you drink alcohol.  Limit alcohol intake to no more than 1 drink per day for nonpregnant women. One drink equals 12 ounces of beer, 5 ounces of wine, or 1 ounces of hard liquor.  Do  not use street drugs.  Do not share needles.  Ask your health care provider for help if you need support or information about quitting drugs.  Tell your health care provider if you often feel depressed.  Tell your health care provider if you have ever been abused or do not feel safe at home.   This information is not intended to replace advice given to you by your health care provider. Make sure you discuss any questions you have with your health care provider.   Document Released: 05/14/2011 Document Revised: 11/19/2014 Document Reviewed: 09/30/2013 Elsevier Interactive Patient Education [employer].

## 2016-03-04 ENCOUNTER — Encounter: Payer: Self-pay | Admitting: Internal Medicine

## 2016-03-04 DIAGNOSIS — E785 Hyperlipidemia, unspecified: Secondary | ICD-10-CM | POA: Insufficient documentation

## 2016-03-06 ENCOUNTER — Other Ambulatory Visit: Payer: Self-pay | Admitting: Internal Medicine

## 2016-03-07 NOTE — Telephone Encounter (Signed)
Sent to the pharmacy by e-scribe. 

## 2016-04-19 ENCOUNTER — Other Ambulatory Visit: Payer: Self-pay | Admitting: Internal Medicine

## 2016-04-20 NOTE — Telephone Encounter (Signed)
Sent to the pharmacy by e-scribe for 3 months.  Pt due for follow up and A1C in Oct 2017

## 2016-04-23 ENCOUNTER — Telehealth: Payer: Self-pay | Admitting: Family Medicine

## 2016-04-23 MED ORDER — NONFORMULARY OR COMPOUNDED ITEM: Status: DC

## 2016-04-23 NOTE — Telephone Encounter (Signed)
Called to the pharmacy.

## 2016-04-23 NOTE — Telephone Encounter (Signed)
Pt needs a refill of her Estriol 0.5mg /ml Cream sent to [facility].

## 2016-07-18 ENCOUNTER — Other Ambulatory Visit: Payer: Self-pay | Admitting: Internal Medicine

## 2016-10-05 ENCOUNTER — Ambulatory Visit (INDEPENDENT_AMBULATORY_CARE_PROVIDER_SITE_OTHER): Payer: Medicare Other | Admitting: Physician Assistant

## 2016-10-05 VITALS — BP 154/68 | HR 81 | Temp 98.4°F | Ht 65.25 in | Wt 165.6 lb

## 2016-10-05 DIAGNOSIS — Z23 Encounter for immunization: Secondary | ICD-10-CM | POA: Diagnosis not present

## 2016-10-05 DIAGNOSIS — R300 Dysuria: Secondary | ICD-10-CM

## 2016-10-05 LAB — POCT URINALYSIS DIP (MANUAL ENTRY)
Nitrite, UA: POSITIVE — AB
Protein Ur, POC: >=300 — AB
SPEC GRAV UA: 1.01
pH, UA: 5

## 2016-10-05 LAB — POC MICROSCOPIC URINALYSIS (UMFC): MUCUS RE: ABSENT

## 2016-10-05 MED ORDER — SULFAMETHOXAZOLE-TRIMETHOPRIM 800-160 MG PO TABS: 1.0000 | ORAL_TABLET | Freq: Two times a day (BID) | ORAL | 0 refills | Status: DC

## 2016-10-05 NOTE — Patient Instructions (Signed)
     IF you received an x-ray today, you will receive an invoice from [facility]. Please contact [facility] at [phone] with questions or concerns regarding your invoice.   IF you received labwork today, you will receive an invoice from [facility]. Please contact [facility] at [phone] with questions or concerns regarding your invoice.   Our billing staff will not be able to assist you with questions regarding bills from these companies.  You will be contacted with the lab results as soon as they are available. The fastest way to get your results is to activate your My Chart account. Instructions are located on the last page of this paperwork. If you have not heard from us regarding the results in 2 weeks, please contact this office.      

## 2016-10-05 NOTE — Progress Notes (Signed)
Patient ID: Valerie Brady, female    DOB: 13-Mar-1946, 70 y.o.   MRN: [id number]  PCP: Lottie Dawson, MD  Chief Complaint  Patient presents with  . Urinary Tract Infection    X 2 days    Subjective:   Presents for evaluation of suspected UTI.  Describes 2 days of urinary urgency, frequency and burning. No fever/chills, nausea/vomiting, diarrhea. Pyridium. No atypical vaginal discharge. Feels a little bit achy, but no back/belly pain. No headache, dizziness   Missed about 7 days of estrogen when went to [place] to help following her brother-in-law's death. Then yesterday she didn't drink as much water as usual and thinks that may have "put me over the edge."   Review of Systems As above.    Patient Active Problem List   Diagnosis Date Noted  . Hyperlipidemia 03/04/2016  . Diverticulitis of colon 05/22/2015  . IBS (irritable bowel syndrome) 01/16/2015  . Essential hypertension 01/16/2015  . Medication side effect 01/16/2015  . Unspecified hypothyroidism 03/05/2014  . Statin intolerance 03/05/2014  . History of recurrent UTIs 06/10/2013  . Pre-diabetes 02/10/2013  . Hypothyroidism   . ADVERSE REACTION TO MEDICATION 05/09/2010  . ALLERGIC RHINITIS, SEASONAL, MILD 09/08/2009  . CARCINOMA, SKIN, SQUAMOUS CELL 05/03/2009  . URINARY INCONTINENCE, URGE, MILD 05/03/2009  . HYPERLIPIDEMIA 05/07/2007  . HYPERTENSION 05/07/2007  . HYPERGLYCEMIA, FASTING 05/07/2007     Prior to Admission medications   Medication Sig Start Date End Date Taking? Authorizing Provider  cholecalciferol (VITAMIN D) 1000 units tablet Take 1,000 Units by mouth daily.   Yes Historical Provider, MD  Cyanocobalamin (B-12 PO) Take by mouth.   Yes Historical Provider, MD  glucose blood (TRUETEST TEST) test strip Use to test blood sugar once daily 04/26/15  Yes Burnis Medin, MD  Lancets MISC Use to test blood sugar once daily 04/27/15  Yes Burnis Medin, MD  levothyroxine (SYNTHROID,  LEVOTHROID) 112 MCG tablet TAKE 1 TABLET ONCE DAILY. 03/07/16  Yes Burnis Medin, MD  Melatonin 5 MG TABS Take 5 mg by mouth at bedtime.   Yes Historical Provider, MD  metFORMIN (GLUCOPHAGE-XR) 500 MG 24 hr tablet TAKE 1 TABLET TWICE DAILY WITH FOOD. 07/18/16  Yes Burnis Medin, MD  NONFORMULARY OR COMPOUNDED ITEM Estriol 0.5mg /ml Cream: Insert 1 vaginally three times weekly 04/23/16  Yes Burnis Medin, MD  PRESCRIPTION MEDICATION Generic Glucometer and test strips.  Lancets if needed.   Yes Historical Provider, MD     Allergies  Allergen Reactions  . Enalapril Maleate     REACTION: Cough  . Olmesartan Medoxomil     REACTION: Gas and chest pain  . Amlodipine Other (See Comments)    Fatigue lethargy at 5 mg dose  . Zetia [Ezetimibe] Other (See Comments)    GI cramps       Objective:  Physical Exam  Constitutional: She is oriented to person, place, and time. Vital signs are normal. She appears well-developed and well-nourished. She is active. No distress.  HENT:  Head: Normocephalic and atraumatic.  Cardiovascular: Normal rate, regular rhythm and normal heart sounds.   Pulmonary/Chest: Effort normal and breath sounds normal.  Abdominal: Soft. Normal appearance and bowel sounds are normal. She exhibits no distension and no mass. There is no hepatosplenomegaly. There is tenderness in the suprapubic area. There is no rigidity, no rebound, no guarding, no CVA tenderness, no tenderness at McBurney's point and negative Murphy's sign. No hernia.  Musculoskeletal: Normal range of motion.  Lumbar back: Normal.  Neurological: She is alert and oriented to person, place, and time.  Skin: Skin is warm and dry. No rash noted. She is not diaphoretic. No pallor.  Psychiatric: She has a normal mood and affect. Her speech is normal and behavior is normal. Judgment normal.    Results for orders placed or performed in visit on 10/05/16  POCT urinalysis dipstick  Result Value Ref Range   Color,  UA red (A) yellow   Clarity, UA cloudy (A) clear   Glucose, UA =250 (A) negative   Bilirubin, UA moderate (A) negative   Ketones, POC UA small (15) (A) negative   Spec Grav, UA 1.010    Blood, UA large (A) negative   pH, UA 5.0    Protein Ur, POC >=300 (A) negative   Urobilinogen, UA >=8.0    Nitrite, UA Positive (A) Negative   Leukocytes, UA large (3+) (A) Negative  POCT Microscopic Urinalysis ([facility])  Result Value Ref Range   WBC,UR,HPF,POC Too numerous to count  (A) None WBC/hpf   RBC,UR,HPF,POC Too numerous to count  (A) None RBC/hpf   Bacteria Many (A) None, Too numerous to count   Mucus Absent Absent   Epithelial Cells, UR Per Microscopy Few (A) None, Too numerous to count cells/hpf          Assessment & Plan:   1. Dysuria Likely UTI. Treat empirically with Septra DS pending UCx. Anticipatory guidance. - POCT urinalysis dipstick - POCT Microscopic Urinalysis ([facility]) - Urine culture - sulfamethoxazole-trimethoprim (BACTRIM DS,SEPTRA DS) 800-160 MG tablet; Take 1 tablet by mouth 2 (two) times daily.  Dispense: 10 tablet; Refill: 0  2. Need for prophylactic vaccination and inoculation against influenza - Flu Vaccine QUAD 36+ mos PF IM (Fluarix & Fluzone Quad PF)   Fara Chute, PA-C Physician Assistant-Certified [facility]

## 2016-10-06 LAB — URINE CULTURE

## 2016-10-17 ENCOUNTER — Other Ambulatory Visit: Payer: Self-pay | Admitting: Internal Medicine

## 2016-10-19 NOTE — Telephone Encounter (Signed)
Sent to the pharmacy by e-scribe for 90 days.  Pt due back in April.

## 2016-10-22 ENCOUNTER — Other Ambulatory Visit: Payer: Self-pay | Admitting: Family Medicine

## 2016-10-22 MED ORDER — LANCETS MICRO THIN 33G MISC: 3 refills | Status: AC

## 2016-10-22 MED ORDER — GLUCOSE BLOOD VI STRP: ORAL_STRIP | 3 refills | Status: DC

## 2016-10-24 ENCOUNTER — Telehealth: Payer: Self-pay | Admitting: Internal Medicine

## 2016-10-24 MED ORDER — NONFORMULARY OR COMPOUNDED ITEM: 1 refills | Status: DC

## 2016-10-24 NOTE — Telephone Encounter (Signed)
Pt needs refill on estriol 0.5 mg cream [facility]. Pt would like refills for 1 year

## 2016-10-24 NOTE — Telephone Encounter (Signed)
Called into the pharmacy.  Due in April for yearly.  Has upcoming appt for follow up A1C on 11/15/16.

## 2016-11-14 NOTE — Progress Notes (Signed)
Pre visit review using our clinic review tool, if applicable. No additional management support is needed unless otherwise documented below in the visit note.  Chief Complaint  Patient presents with  . Follow-up    HPI: Valerie Brady 71 y.o. comes in for follow-up of CDM  Dm ; up some    Bro in law  Died  fastings the worse  Bp  ? If up 140s at home  Had se of ace and arb. Question about healthcare maintenance due for colonoscopy not high risk. Has had recent UTI treated. Otherwise generally well exercises line dancing eating decrease sugar healthy. ROS: See pertinent positives and negatives per HPI.  Past Medical History:  Diagnosis Date  . Carotidynia   . Diverticulitis of colon 05/22/2015  . Gestational diabetes   . Hyperlipidemia   . Hypertension   . Hypothyroidism   . Syncope 2011   reaction to benicar    Family History  Problem Relation Age of Onset  . Hypertension Mother   . Dementia Mother   . Heart disease Father   . Dementia Father   . Diabetes Father   . Hypertension Brother     Social History   Social History  . Marital status: Married    Spouse name: N/A  . Number of children: N/A  . Years of education: N/A   Occupational History  . retired Nurse, children's   Social History Main Topics  . Smoking status: Never Smoker  . Smokeless tobacco: Never Used  . Alcohol use 0.0 oz/week     Comment: glass of wine each day  . Drug use: No  . Sexual activity: No   Other Topics Concern  . None   Social History Narrative   Did not rnew lisence 2017   Neg tobacco    Married   HH of 5   Pet cat   Mom with dementia    Outpatient Medications Prior to Visit  Medication Sig Dispense Refill  . cholecalciferol (VITAMIN D) 1000 units tablet Take 1,000 Units by mouth daily.    . Cyanocobalamin (B-12 PO) Take by mouth.    Marland Kitchen glucose blood (TRUETEST TEST) test strip Use to test blood sugar once daily 100 each 3  . glucose blood test strip Use  to test blood glucose once daily. 100 each 3  . LANCETS MICRO THIN 33G MISC Use to test blood glucose once daily. 100 each 3  . Lancets MISC Use to test blood sugar once daily 100 each 3  . levothyroxine (SYNTHROID, LEVOTHROID) 112 MCG tablet TAKE 1 TABLET ONCE DAILY. 90 tablet 2  . Melatonin 5 MG TABS Take 5 mg by mouth at bedtime.    . metFORMIN (GLUCOPHAGE-XR) 500 MG 24 hr tablet TAKE 1 TABLET TWICE DAILY WITH FOOD. 180 tablet 0  . PRESCRIPTION MEDICATION Generic Glucometer and test strips.  Lancets if needed.    . NONFORMULARY OR COMPOUNDED ITEM Estriol 0.5mg /ml Cream: Insert 1 vaginally three times weekly 36 each 1  . sulfamethoxazole-trimethoprim (BACTRIM DS,SEPTRA DS) 800-160 MG tablet Take 1 tablet by mouth 2 (two) times daily. 10 tablet 0   No facility-administered medications prior to visit.      EXAM:  BP (!) 148/74 (BP Location: Right Arm, Patient Position: Sitting, Cuff Size: Normal)   Temp 97.9 F (36.6 C) (Oral)   Wt 167 lb 3.2 oz (75.8 kg)   BMI 27.61 kg/m   Body mass index is 27.61 kg/m.  GENERAL:  vitals reviewed and listed above, alert, oriented, appears well hydrated and in no acute distress HEENT: atraumatic, conjunctiva  clear, no obvious abnormalities on inspection of external nose and ears  NECK: no obvious masses on inspection palpation  LUNGS: clear to auscultation bilaterally, no wheezes, rales or rhonchi, good air movement CV: HRRR, no clubbing cyanosis or  peripheral edema nl cap refill  Abdomen:  Sof,t normal bowel sounds without hepatosplenomegaly, no guarding rebound or masses no CVA tenderness MS: moves all extremities without noticeable focal  abnormality PSYCH: pleasant and cooperative, no obvious depression or anxiety Lab Results  Component Value Date   WBC 10.0 02/14/2015   HGB 13.7 02/14/2015   HCT 40.4 02/14/2015   PLT 382.0 02/14/2015   GLUCOSE 111 (H) 02/27/2016   CHOL 256 (H) 02/27/2016   TRIG 137.0 02/27/2016   HDL 54.60 02/27/2016     LDLDIRECT 182.0 02/14/2015   LDLCALC 174 (H) 02/27/2016   ALT 8 02/27/2016   AST 12 02/27/2016   NA 139 02/27/2016   K 4.3 02/27/2016   CL 105 02/27/2016   CREATININE 0.76 02/27/2016   BUN 14 02/27/2016   CO2 25 02/27/2016   TSH 1.52 02/27/2016   HGBA1C 6.2 11/15/2016   MICROALBUR <0.7 02/27/2016   BP Readings from Last 3 Encounters:  11/15/16 (!) 148/74  10/05/16 (!) 154/68  02/27/16 130/70   Wt Readings from Last 3 Encounters:  11/15/16 167 lb 3.2 oz (75.8 kg)  10/05/16 165 lb 9.6 oz (75.1 kg)  02/27/16 156 lb 9.6 oz (71 kg)     ASSESSMENT AND PLAN:  Discussed the following assessment and plan:  Diabetes mellitus without complication ([place]) - Improved now on the nondiabetic range on metformin 500 twice a day - Plan: POCT A1C  Essential hypertension -  In readings my chart consider carvedilol because of side effects of  other meds  Medication management  Hypothyroidism, unspecified type  Hyperlipidemia, unspecified hyperlipidemia type - statin intol ? if ever tried atorrva cna adress at fuiture visits   Need for vaccination with 13-polyvalent pneumococcal conjugate vaccine - Plan: Pneumococcal conjugate vaccine 13-valent -Patient advised to return or notify health care team  if symptoms worsen ,persist or new concerns arise.  Patient Instructions  Take blood pressure readings twice a day for 7- 10 days and then periodically .sendin readings  My chart    If  Not at goal.     We can add trying carvediolol  And follow .   prevnar and cologuard . Plan  Lab and  Yearly exam  In 4-6 months  Or as needed.     Standley Brooking. Denario Bagot M.D.

## 2016-11-15 ENCOUNTER — Ambulatory Visit (INDEPENDENT_AMBULATORY_CARE_PROVIDER_SITE_OTHER): Payer: Medicare Other | Admitting: Internal Medicine

## 2016-11-15 ENCOUNTER — Encounter: Payer: Self-pay | Admitting: Internal Medicine

## 2016-11-15 VITALS — BP 148/74 | Temp 97.9°F | Wt 167.2 lb

## 2016-11-15 DIAGNOSIS — I10 Essential (primary) hypertension: Secondary | ICD-10-CM

## 2016-11-15 DIAGNOSIS — E785 Hyperlipidemia, unspecified: Secondary | ICD-10-CM

## 2016-11-15 DIAGNOSIS — Z23 Encounter for immunization: Secondary | ICD-10-CM

## 2016-11-15 DIAGNOSIS — Z79899 Other long term (current) drug therapy: Secondary | ICD-10-CM | POA: Diagnosis not present

## 2016-11-15 DIAGNOSIS — E039 Hypothyroidism, unspecified: Secondary | ICD-10-CM | POA: Diagnosis not present

## 2016-11-15 DIAGNOSIS — E119 Type 2 diabetes mellitus without complications: Secondary | ICD-10-CM | POA: Diagnosis not present

## 2016-11-15 LAB — POCT GLYCOSYLATED HEMOGLOBIN (HGB A1C): Hemoglobin A1C: 6.2

## 2016-11-15 MED ORDER — NONFORMULARY OR COMPOUNDED ITEM: 3 refills | Status: DC

## 2016-11-15 NOTE — Patient Instructions (Addendum)
Take blood pressure readings twice a day for 7- 10 days and then periodically .sendin readings  My chart    If  Not at goal.     We can add trying carvediolol  And follow .   prevnar and cologuard . Plan  Lab and  Yearly exam  In 4-6 months  Or as needed.

## 2016-12-11 NOTE — Progress Notes (Signed)
Pre visit review using our clinic review tool, if applicable. No additional management support is needed unless otherwise documented below in the visit note.  Chief Complaint  Patient presents with  . Follow-up    HPI: Valerie Brady 71 y.o.   Got flu  jan 9th   In bed 3 days     Problem now is that   Post infectious asthmatic  Bronchitis and tightness  And  Raspy cough She has no specific diagnosis of asthma but when she gets a significant respiratory infection can get a prolonged cough with reactive bronchospasm and the response to prednisone. She think she is at that age at this time. No purulent hemoptysis fever. 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 No other serious pain dyspnea. In the past inhalers not used on any regular basis. ROS: See pertinent positives and negatives per HPI.  Past Medical History:  Diagnosis Date  . Carotidynia   . Diverticulitis of colon 05/22/2015  . Gestational diabetes   . Hyperlipidemia   . Hypertension   . Hypothyroidism   . Syncope 2011   reaction to benicar    Family History  Problem Relation Age of Onset  . Hypertension Mother   . Dementia Mother   . Heart disease Father   . Dementia Father   . Diabetes Father   . Hypertension Brother     Social History   Social History  . Marital status: Married    Spouse name: N/A  . Number of children: N/A  . Years of education: N/A   Occupational History  . retired Nurse, children's   Social History Main Topics  . Smoking status: Never Smoker  . Smokeless tobacco: Never Used  . Alcohol use 0.0 oz/week     Comment: glass of wine each day  . Drug use: No  . Sexual activity: No   Other Topics Concern  . None   Social History Narrative   Did not rnew lisence 2017   Neg tobacco    Married   HH of 5   Pet cat   Mom with dementia    Outpatient Medications Prior to Visit  Medication Sig Dispense  Refill  . cholecalciferol (VITAMIN D) 1000 units tablet Take 1,000 Units by mouth daily.    . Cyanocobalamin (B-12 PO) Take by mouth.    Marland Kitchen glucose blood (TRUETEST TEST) test strip Use to test blood sugar once daily 100 each 3  . glucose blood test strip Use to test blood glucose once daily. 100 each 3  . LANCETS MICRO THIN 33G MISC Use to test blood glucose once daily. 100 each 3  . Lancets MISC Use to test blood sugar once daily 100 each 3  . levothyroxine (SYNTHROID, LEVOTHROID) 112 MCG tablet TAKE 1 TABLET ONCE DAILY. 90 tablet 2  . Melatonin 5 MG TABS Take 5 mg by mouth at bedtime.    . metFORMIN (GLUCOPHAGE-XR) 500 MG 24 hr tablet TAKE 1 TABLET TWICE DAILY WITH FOOD. 180 tablet 0  . NONFORMULARY OR COMPOUNDED ITEM Estriol 0.5mg /ml Cream: Insert 1 vaginally three times weekly 36 each 3  . PRESCRIPTION MEDICATION Generic Glucometer and test strips.  Lancets if needed.     No facility-administered medications prior to visit.      EXAM:  BP (!) 154/80 (BP Location: Right Arm, Patient Position: Sitting, Cuff Size: Normal)   Pulse 68  Temp 97.9 F (36.6 C) (Oral)   Ht 5' 5.25" (1.657 m)   Wt 166 lb (75.3 kg)   SpO2 97%   BMI 27.41 kg/m   Body mass index is 27.41 kg/m.  GENERAL: vitals reviewed and listed above, alert, oriented, appears well hydrated and in no acute distress minnor hoarseness  HEENT: atraumatic, conjunctiva  clear, no obvious abnormalities on inspection of external nose and ears OP : no lesion edema or exudate  NECK: no obvious masses on inspection palpation  LUNGS: clear to auscultation bilaterally, no wheezes, rales or rhonchi,  ? Prolonged resp  [phase CV: HRRR, no clubbing cyanosis or  peripheral edema nl cap refill  PSYCH: pleasant and cooperative, no obvious depression or anxiety  ASSESSMENT AND PLAN:  Discussed the following assessment and plan:  Post-viral cough syndrome  Hx of influenza Post infectious.  Coughing and subjective bronchospasm with  history of same.   Sx  Asthmatic .  inhlaers not used. Has responded to steroids in the past short course 3-5 days do not think x-ray is needed at this time. -Patient advised to return or notify health care team  if symptoms worsen ,persist or new concerns arise.  Patient Instructions  I agree   Prednisone  a try for post infectious bronchospasm .    Standley Brooking. Monetta Lick M.D.

## 2016-12-12 ENCOUNTER — Encounter: Payer: Self-pay | Admitting: Internal Medicine

## 2016-12-12 ENCOUNTER — Ambulatory Visit (INDEPENDENT_AMBULATORY_CARE_PROVIDER_SITE_OTHER): Payer: Medicare Other | Admitting: Internal Medicine

## 2016-12-12 VITALS — BP 154/80 | HR 68 | Temp 97.9°F | Ht 65.25 in | Wt 166.0 lb

## 2016-12-12 DIAGNOSIS — R05 Cough: Secondary | ICD-10-CM | POA: Diagnosis not present

## 2016-12-12 DIAGNOSIS — R058 Other specified cough: Secondary | ICD-10-CM

## 2016-12-12 DIAGNOSIS — Z8709 Personal history of other diseases of the respiratory system: Secondary | ICD-10-CM

## 2016-12-12 MED ORDER — PREDNISONE 20 MG PO TABS: 20.0000 mg | ORAL_TABLET | Freq: Two times a day (BID) | ORAL | 0 refills | Status: DC

## 2016-12-12 NOTE — Patient Instructions (Signed)
I agree   Prednisone  a try for post infectious bronchospasm .

## 2016-12-17 ENCOUNTER — Other Ambulatory Visit: Payer: Self-pay | Admitting: Internal Medicine

## 2016-12-19 NOTE — Telephone Encounter (Signed)
Sent to the pharmacy by e-scribe. Pt has upcoming yearly on 05/01/17.

## 2017-01-07 DIAGNOSIS — Z1211 Encounter for screening for malignant neoplasm of colon: Secondary | ICD-10-CM | POA: Diagnosis not present

## 2017-01-07 DIAGNOSIS — Z1212 Encounter for screening for malignant neoplasm of rectum: Secondary | ICD-10-CM | POA: Diagnosis not present

## 2017-01-15 LAB — COLOGUARD: Cologuard: POSITIVE

## 2017-01-16 ENCOUNTER — Encounter: Payer: Self-pay | Admitting: Internal Medicine

## 2017-01-17 ENCOUNTER — Telehealth: Payer: Self-pay | Admitting: Emergency Medicine

## 2017-01-17 NOTE — Telephone Encounter (Signed)
Left voicemail for to give the office a call back in regards to Cologuard results being positive. Need permission to put in referral to GI of pt choice for a colonoscopy

## 2017-01-18 ENCOUNTER — Telehealth: Payer: Self-pay | Admitting: Emergency Medicine

## 2017-01-18 NOTE — Telephone Encounter (Signed)
Left a message for pt to give the office a call back.

## 2017-01-19 ENCOUNTER — Other Ambulatory Visit: Payer: Self-pay | Admitting: Internal Medicine

## 2017-01-22 ENCOUNTER — Telehealth: Payer: Self-pay | Admitting: Emergency Medicine

## 2017-01-22 NOTE — Telephone Encounter (Signed)
Left voicemail for pt to give the office a call back

## 2017-01-25 ENCOUNTER — Encounter: Payer: Self-pay | Admitting: Emergency Medicine

## 2017-02-04 ENCOUNTER — Encounter: Payer: Self-pay | Admitting: Internal Medicine

## 2017-03-27 DIAGNOSIS — F4323 Adjustment disorder with mixed anxiety and depressed mood: Secondary | ICD-10-CM | POA: Diagnosis not present

## 2017-04-11 DIAGNOSIS — F4323 Adjustment disorder with mixed anxiety and depressed mood: Secondary | ICD-10-CM | POA: Diagnosis not present

## 2017-04-18 DIAGNOSIS — F4323 Adjustment disorder with mixed anxiety and depressed mood: Secondary | ICD-10-CM | POA: Diagnosis not present

## 2017-04-19 DIAGNOSIS — H2513 Age-related nuclear cataract, bilateral: Secondary | ICD-10-CM | POA: Diagnosis not present

## 2017-04-19 DIAGNOSIS — E119 Type 2 diabetes mellitus without complications: Secondary | ICD-10-CM | POA: Diagnosis not present

## 2017-04-19 DIAGNOSIS — H40013 Open angle with borderline findings, low risk, bilateral: Secondary | ICD-10-CM | POA: Diagnosis not present

## 2017-04-19 DIAGNOSIS — H43811 Vitreous degeneration, right eye: Secondary | ICD-10-CM | POA: Diagnosis not present

## 2017-04-19 LAB — HM DIABETES EYE EXAM

## 2017-04-24 ENCOUNTER — Other Ambulatory Visit: Payer: Medicare Other

## 2017-04-30 NOTE — Progress Notes (Signed)
Chief Complaint  Patient presents with  . Annual Exam    HPI: Valerie Brady 71 y.o. comes in today for  Yearly visit medicare   Chronic disease management  Having toughttime  With family husband has early signs of dementia  Stress etc.  seeing margaret  Barnes  Sugar  And bp up from this  But no utis since on topical estrogen   Admin problem with getting the colonoscopy.  Had pos cologuard    Getting new gi to do for admin reasons   Gaba for headaches if needed   Thyroid no change med   Health Maintenance  Topic Date Due  . FOOT EXAM  10/27/1956  . DEXA SCAN  10/27/2012  . COLONOSCOPY  11/14/2016  . URINE MICROALBUMIN  02/26/2017  . Hepatitis C Screening  05/01/2018 (Originally [birth year])  . HEMOGLOBIN A1C  05/15/2017  . INFLUENZA VACCINE  06/12/2017  . MAMMOGRAM  08/04/2017  . OPHTHALMOLOGY EXAM  04/19/2018  . TETANUS/TDAP  03/05/2024  . PNA vac Low Risk Adult  Completed   Health Maintenance Review LIFESTYLE:  TAD ocass etoh  Sugar beverages:n Sleep: using gaba for headaches      Hearing: ok  Vision:  No limitations at present . Last eye check UTD  Safety:  Has smoke detector and wears seat belts.  No firearms. No excess sun exposure. Sees dentist regularly.  Falls: n  Advance directive :  Reviewed  Has one.  Memory: Felt to be good  , no concern from her or her family.  Depression: No anhedonia unusual crying or depressive symptoms except as above   Nutrition: Eats well balanced diet; adequate calcium and vitamin D. No swallowing chewing problems.  Injury: no major injuries in the last six months.  Other healthcare providers:  Reviewed today .  Social:  Lives with spouse married. No pets.   Preventive parameters: up-to-date  Reviewed   ADLS:   There are no problems or need for assistance  driving, feeding, obtaining food, dressing, toileting and bathing, managing money using phone. She is independent.   ROS:  Has inc recnetly related to  stress  But no atypia GEN/ HEENT: No fever, significant weight changes sweats headaches vision problems hearing changes, CV/ PULM; No chest pain shortness of breath cough, syncope,edema  change in exercise tolerance. GI /GU: No adominal pain, vomiting, change in bowel habits. No blood in the stool. No significant GU symptoms. SKIN/HEME: ,no acute skin rashes suspicious lesions or bleeding. No lymphadenopathy, nodules, masses.  NEURO/ PSYCH:  No neurologic signs such as weakness numbness. No depression anxiety. IMM/ Allergy: No unusual infections.  Allergy .   REST of 12 system review negative except as per HPI   Past Medical History:  Diagnosis Date  . Carotidynia   . Diverticulitis of colon 05/22/2015  . Gestational diabetes   . Hyperlipidemia   . Hypertension   . Hypothyroidism   . Syncope 2011   reaction to benicar    Family History  Problem Relation Age of Onset  . Hypertension Mother   . Dementia Mother   . Heart disease Father   . Dementia Father   . Diabetes Father   . Hypertension Brother     Social History   Social History  . Marital status: Married    Spouse name: N/A  . Number of children: N/A  . Years of education: N/A   Occupational History  . retired Nurse, children's   Social  History Main Topics  . Smoking status: Never Smoker  . Smokeless tobacco: Never Used  . Alcohol use 0.0 oz/week     Comment: glass of wine each day  . Drug use: No  . Sexual activity: No   Other Topics Concern  . None   Social History Narrative   Did not rnew lisence 2017   Neg tobacco    Married   HH of 5   Pet cat   Mom with dementia    Outpatient Encounter Prescriptions as of 05/01/2017  Medication Sig  . cholecalciferol (VITAMIN D) 1000 units tablet Take 1,000 Units by mouth daily.  . Cyanocobalamin (B-12 PO) Take by mouth.  . gabapentin (NEURONTIN) 300 MG capsule Take 1 capsule (300 mg total) by mouth 3 (three) times daily. Or as directed  . glucose  blood (TRUETEST TEST) test strip Use to test blood sugar once daily  . LANCETS MICRO THIN 33G MISC Use to test blood glucose once daily.  . Lancets MISC Use to test blood sugar once daily  . levothyroxine (SYNTHROID, LEVOTHROID) 112 MCG tablet TAKE 1 TABLET ONCE DAILY.  . Melatonin 5 MG TABS Take 5 mg by mouth at bedtime.  . metFORMIN (GLUCOPHAGE-XR) 500 MG 24 hr tablet TAKE 1 TABLET TWICE DAILY WITH FOOD.  Marland Kitchen NONFORMULARY OR COMPOUNDED ITEM Estriol 0.5mg /ml Cream: Insert 1 vaginally three times weekly  . PRESCRIPTION MEDICATION Generic Glucometer and test strips.  Lancets if needed.  . [DISCONTINUED] gabapentin (NEURONTIN) 300 MG capsule Take 300 mg by mouth 3 (three) times daily.  . [DISCONTINUED] glucose blood test strip Use to test blood glucose once daily.  . [DISCONTINUED] predniSONE (DELTASONE) 20 MG tablet Take 1 tablet (20 mg total) by mouth 2 (two) times daily with a meal.  . carvedilol (COREG) 3.125 MG tablet Take 1 tablet (3.125 mg total) by mouth 2 (two) times daily with a meal.   No facility-administered encounter medications on file as of 05/01/2017.     EXAM:  BP (!) 148/90 (BP Location: Left Arm, Patient Position: Sitting, Cuff Size: Normal)   Pulse 87   Temp 97.7 F (36.5 C) (Oral)   Ht 5\' 5"  (1.651 m)   Wt 165 lb 1.6 oz (74.9 kg)   BMI 27.47 kg/m   Body mass index is 27.47 kg/m.  Physical Exam: Vital signs reviewed WUJ:WJXB is a well-developed well-nourished alert cooperative   who appears stated age in no acute distress.  HEENT: normocephalic atraumatic , Eyes: PERRL EOM's full, conjunctiva clear, Nares: paten,t no deformity discharge or tenderness., Ears: no deformity EAC's clear TMs with normal landmarks. Mouth: clear OP, no lesions, edema.  Moist mucous membranes. Dentition in adequate repair. NECK: supple without masses, thyromegaly or bruits. CHEST/PULM:  Clear to auscultation and percussion breath sounds equal no wheeze , rales or rhonchi. No chest wall  deformities or tenderness. CV: PMI is nondisplaced, S1 S2 no gallops, murmurs, rubs. Peripheral pulses are full without delay.No JVD .  ABDOMEN: Bowel sounds normal nontender  No guard or rebound, no hepato splenomegal no CVA tenderness.   Extremtities:  No clubbing cyanosis or edema, no acute joint swelling or redness no focal atrophy NEURO:  Oriented x3, cranial nerves 3-12 appear to be intact, no obvious focal weakness,gait within normal limits no abnormal reflexes or asymmetrical SKIN: No acute rashes normal turgor, color, no bruising or petechiae. PSYCH: Oriented, good eye contact, no obvious depression anxiety, cognition and judgment appear normal. LN: no cervical axillary  adenopathy No noted  deficits in memory, attention, and speech.     ASSESSMENT AND PLAN:  Discussed the following assessment and plan:  Diabetes mellitus without complication ([place]) - lab moitoring no sx  - Plan: Basic metabolic panel, Hemoglobin A1c, Lipid panel, TSH, CBC with Differential/Platelet, Hepatic function panel, Microalbumin / creatinine urine ratio  Essential hypertension - cannt take ace arb amlo  trial low dose coreg and inc as  needed  - Plan: Basic metabolic panel, Hemoglobin A1c, Lipid panel, TSH, CBC with Differential/Platelet, Hepatic function panel, Microalbumin / creatinine urine ratio  Medication management - Plan: Basic metabolic panel, Hemoglobin A1c, Lipid panel, TSH, CBC with Differential/Platelet, Hepatic function panel, Microalbumin / creatinine urine ratio  Hypothyroidism, unspecified type - Plan: Basic metabolic panel, Hemoglobin A1c, Lipid panel, TSH, CBC with Differential/Platelet, Hepatic function panel, Microalbumin / creatinine urine ratio  Hyperlipidemia, unspecified hyperlipidemia type - Plan: Basic metabolic panel, Hemoglobin A1c, Lipid panel, TSH, CBC with Differential/Platelet, Hepatic function panel, Microalbumin / creatinine urine ratio  History of recurrent  UTIs  Positive colorectal cancer screening using Cologuard test - gettig colon let us know if need re referral patient aware  Worse parameters from stress and attendance .. workin on it  See above Patient Care Team: Satonya Lux, Standley Brooking, MD as PCP - General [place] Campbell, MD (Gastroenterology) Clent Jacks, MD as Consulting Physician (Ophthalmology)  Patient Instructions  Add carvedilol  As planned for bp  Add you exercise back .   ROV in 3 months . Or as needed.  Get your mammo and colonscopy.      Standley Brooking. Lonette Stevison M.D.  Lab Results  Component Value Date   WBC 10.0 05/01/2017   HGB 14.2 05/01/2017   HCT 42.6 05/01/2017   PLT 447.0 (H) 05/01/2017   GLUCOSE 133 (H) 05/01/2017   CHOL 312 (H) 05/01/2017   TRIG 266.0 (H) 05/01/2017   HDL 50.30 05/01/2017   LDLDIRECT 188.0 05/01/2017   LDLCALC 174 (H) 02/27/2016   ALT 17 05/01/2017   AST 16 05/01/2017   NA 135 05/01/2017   K 4.6 05/01/2017   CL 102 05/01/2017   CREATININE 0.76 05/01/2017   BUN 20 05/01/2017   CO2 23 05/01/2017   TSH 2.88 05/01/2017   HGBA1C 7.0 (H) 05/01/2017   MICROALBUR 1.4 05/01/2017

## 2017-05-01 ENCOUNTER — Encounter: Payer: Self-pay | Admitting: Internal Medicine

## 2017-05-01 ENCOUNTER — Ambulatory Visit (INDEPENDENT_AMBULATORY_CARE_PROVIDER_SITE_OTHER): Payer: Medicare Other | Admitting: Internal Medicine

## 2017-05-01 VITALS — BP 148/90 | HR 87 | Temp 97.7°F | Ht 65.0 in | Wt 165.1 lb

## 2017-05-01 DIAGNOSIS — E119 Type 2 diabetes mellitus without complications: Secondary | ICD-10-CM

## 2017-05-01 DIAGNOSIS — I10 Essential (primary) hypertension: Secondary | ICD-10-CM | POA: Diagnosis not present

## 2017-05-01 DIAGNOSIS — E039 Hypothyroidism, unspecified: Secondary | ICD-10-CM | POA: Diagnosis not present

## 2017-05-01 DIAGNOSIS — E785 Hyperlipidemia, unspecified: Secondary | ICD-10-CM

## 2017-05-01 DIAGNOSIS — Z87440 Personal history of urinary (tract) infections: Secondary | ICD-10-CM | POA: Diagnosis not present

## 2017-05-01 DIAGNOSIS — R195 Other fecal abnormalities: Secondary | ICD-10-CM

## 2017-05-01 DIAGNOSIS — Z79899 Other long term (current) drug therapy: Secondary | ICD-10-CM

## 2017-05-01 LAB — CBC WITH DIFFERENTIAL/PLATELET
BASOS ABS: 0.1 10*3/uL (ref 0.0–0.1)
Basophils Relative: 0.6 % (ref 0.0–3.0)
EOS PCT: 1.2 % (ref 0.0–5.0)
Eosinophils Absolute: 0.1 10*3/uL (ref 0.0–0.7)
HCT: 42.6 % (ref 36.0–46.0)
Hemoglobin: 14.2 g/dL (ref 12.0–15.0)
LYMPHS ABS: 4.8 10*3/uL — AB (ref 0.7–4.0)
Lymphocytes Relative: 48.3 % — ABNORMAL HIGH (ref 12.0–46.0)
MCHC: 33.4 g/dL (ref 30.0–36.0)
MCV: 87.8 fl (ref 78.0–100.0)
MONO ABS: 0.6 10*3/uL (ref 0.1–1.0)
MONOS PCT: 6.2 % (ref 3.0–12.0)
NEUTROS PCT: 43.7 % (ref 43.0–77.0)
Neutro Abs: 4.4 10*3/uL (ref 1.4–7.7)
PLATELETS: 447 10*3/uL — AB (ref 150.0–400.0)
RBC: 4.85 Mil/uL (ref 3.87–5.11)
RDW: 13.8 % (ref 11.5–15.5)
WBC: 10 10*3/uL (ref 4.0–10.5)

## 2017-05-01 LAB — HEPATIC FUNCTION PANEL
ALBUMIN: 4.7 g/dL (ref 3.5–5.2)
ALT: 17 U/L (ref 0–35)
AST: 16 U/L (ref 0–37)
Alkaline Phosphatase: 38 U/L — ABNORMAL LOW (ref 39–117)
BILIRUBIN TOTAL: 0.5 mg/dL (ref 0.2–1.2)
Bilirubin, Direct: 0.1 mg/dL (ref 0.0–0.3)
Total Protein: 7.3 g/dL (ref 6.0–8.3)

## 2017-05-01 LAB — LIPID PANEL
Cholesterol: 312 mg/dL — ABNORMAL HIGH (ref 0–200)
HDL: 50.3 mg/dL (ref >39.00)
NONHDL: 261.44
TRIGLYCERIDES: 266 mg/dL — AB (ref 0.0–149.0)
Total CHOL/HDL Ratio: 6
VLDL: 53.2 mg/dL — AB (ref 0.0–40.0)

## 2017-05-01 LAB — BASIC METABOLIC PANEL
BUN: 20 mg/dL (ref 6–23)
CHLORIDE: 102 meq/L (ref 96–112)
CO2: 23 mEq/L (ref 19–32)
Calcium: 9.8 mg/dL (ref 8.4–10.5)
Creatinine, Ser: 0.76 mg/dL (ref 0.40–1.20)
GFR: 80.08 mL/min (ref >60.00)
GLUCOSE: 133 mg/dL — AB (ref 70–99)
POTASSIUM: 4.6 meq/L (ref 3.5–5.1)
SODIUM: 135 meq/L (ref 135–145)

## 2017-05-01 LAB — TSH: TSH: 2.88 u[IU]/mL (ref 0.35–4.50)

## 2017-05-01 LAB — LDL CHOLESTEROL, DIRECT: Direct LDL: 188 mg/dL

## 2017-05-01 LAB — MICROALBUMIN / CREATININE URINE RATIO
Creatinine,U: 206.9 mg/dL
MICROALB UR: 1.4 mg/dL (ref 0.0–1.9)
MICROALB/CREAT RATIO: 0.7 mg/g (ref 0.0–30.0)

## 2017-05-01 LAB — HEMOGLOBIN A1C: Hgb A1c MFr Bld: 7 % — ABNORMAL HIGH (ref 4.6–6.5)

## 2017-05-01 MED ORDER — GABAPENTIN 300 MG PO CAPS: 300.0000 mg | ORAL_CAPSULE | Freq: Three times a day (TID) | ORAL | 1 refills | Status: DC

## 2017-05-01 MED ORDER — CARVEDILOL 3.125 MG PO TABS: 3.1250 mg | ORAL_TABLET | Freq: Two times a day (BID) | ORAL | 3 refills | Status: DC

## 2017-05-01 NOTE — Patient Instructions (Addendum)
Add carvedilol  As planned for bp  Add you exercise back .   ROV in 3 months . Or as needed.  Get your mammo and colonscopy.

## 2017-05-02 DIAGNOSIS — F4323 Adjustment disorder with mixed anxiety and depressed mood: Secondary | ICD-10-CM | POA: Diagnosis not present

## 2017-05-03 ENCOUNTER — Encounter: Payer: Self-pay | Admitting: Family Medicine

## 2017-05-21 DIAGNOSIS — F4323 Adjustment disorder with mixed anxiety and depressed mood: Secondary | ICD-10-CM | POA: Diagnosis not present

## 2017-06-23 ENCOUNTER — Other Ambulatory Visit: Payer: Self-pay | Admitting: Internal Medicine

## 2017-06-27 DIAGNOSIS — F4323 Adjustment disorder with mixed anxiety and depressed mood: Secondary | ICD-10-CM | POA: Diagnosis not present

## 2017-07-11 DIAGNOSIS — F4323 Adjustment disorder with mixed anxiety and depressed mood: Secondary | ICD-10-CM | POA: Diagnosis not present

## 2017-07-12 DIAGNOSIS — Z1231 Encounter for screening mammogram for malignant neoplasm of breast: Secondary | ICD-10-CM | POA: Diagnosis not present

## 2017-07-22 ENCOUNTER — Encounter: Payer: Self-pay | Admitting: Internal Medicine

## 2017-07-24 ENCOUNTER — Encounter: Payer: Self-pay | Admitting: Internal Medicine

## 2017-07-24 ENCOUNTER — Other Ambulatory Visit: Payer: Self-pay | Admitting: Emergency Medicine

## 2017-07-24 MED ORDER — CARVEDILOL 6.25 MG PO TABS: 6.2500 mg | ORAL_TABLET | Freq: Two times a day (BID) | ORAL | 1 refills | Status: DC

## 2017-07-24 NOTE — Telephone Encounter (Signed)
Please send in Carvedilol 6.25 mg  1 po bid disp 180  refill x 6 months .     And  Inform patient of this

## 2017-07-31 NOTE — Progress Notes (Signed)
Chief Complaint  Patient presents with  . Follow-up    27mo - Doing well since new medication added. Increased exercise.     HPI: Valerie Brady 71 y.o. come in for Chronic disease management  DM BG  130 150     Fasting hyperglycemia  No sx .    HT: bp good on 12.5 bid coreg without sig se    ROS: See pertinent positives and negatives per HPI. No cv pulm sx   Past Medical History:  Diagnosis Date  . Carotidynia   . Diverticulitis of colon 05/22/2015  . Gestational diabetes   . Hyperlipidemia   . Hypertension   . Hypothyroidism   . Syncope 2011   reaction to benicar    Family History  Problem Relation Age of Onset  . Hypertension Mother   . Dementia Mother   . Heart disease Father   . Dementia Father   . Diabetes Father   . Hypertension Brother     Social History   Social History  . Marital status: Married    Spouse name: N/A  . Number of children: N/A  . Years of education: N/A   Occupational History  . retired Nurse, children[employer]   Social History Main Topics  . Smoking status: Never Smoker  . Smokeless tobacco: Never Used  . Alcohol use 0.0 oz/week     Comment: glass of wine each day  . Drug use: No  . Sexual activity: No   Other Topics Concern  . None   Social History Narrative   Did not rnew lisence 2017   Neg tobacco    Married   HH of 5   Pet cat   Mom with dementia    Outpatient Medications Prior to Visit  Medication Sig Dispense Refill  . carvedilol (COREG) 6.25 MG tablet Take 1 tablet (6.25 mg total) by mouth 2 (two) times daily with a meal. 180 tablet 1  . cholecalciferol (VITAMIN D) 1000 units tablet Take 1,000 Units by mouth daily.    . Cyanocobalamin (B-12 PO) Take by mouth.    . gabapentin (NEURONTIN) 300 MG capsule Take 1 capsule (300 mg total) by mouth 3 (three) times daily. Or as directed 90 capsule 1  . glucose blood (TRUETEST TEST) test strip Use to test blood sugar once daily 100 each 3  . LANCETS MICRO THIN 33G  MISC Use to test blood glucose once daily. 100 each 3  . levothyroxine (SYNTHROID, LEVOTHROID) 112 MCG tablet TAKE 1 TABLET ONCE DAILY. 90 tablet 0  . Melatonin 5 MG TABS Take 5 mg by mouth at bedtime.    . metFORMIN (GLUCOPHAGE-XR) 500 MG 24 hr tablet TAKE 1 TABLET TWICE DAILY WITH FOOD. 180 tablet 1  . NONFORMULARY OR COMPOUNDED ITEM Estriol 0.5mg /ml Cream: Insert 1 vaginally three times weekly 36 each 3  . PRESCRIPTION MEDICATION Generic Glucometer and test strips.  Lancets if needed.    . carvedilol (COREG) 3.125 MG tablet Take 1 tablet (3.125 mg total) by mouth 2 (two) times daily with a meal. (Patient not taking: Reported on 08/01/2017) 60 tablet 3  . Lancets MISC Use to test blood sugar once daily (Patient not taking: Reported on 08/01/2017) 100 each 3   No facility-administered medications prior to visit.      EXAM:  BP 110/62 (BP Location: Right Arm, Patient Position: Sitting, Cuff Size: Normal)   Pulse 74   Temp 98 F (36.7 C) (Oral)  Wt 163 lb (73.9 kg)   BMI 27.12 kg/m   Body mass index is 27.12 kg/m.  GENERAL: vitals reviewed and listed above, alert, oriented, appears well hydrated and in no acute distress HEENT: atraumatic, conjunctiva  clear, no obvious abnormalities on inspection of external nose and earsNECK: no obvious masses on inspection palpation  LUNGS: clear to auscultation bilaterally, no wheezes, rales or rhonchi,  CV: HRRR, no clubbing cyanosis or  peripheral edema nl cap refill  MS: moves all extremities without noticeable focal  abnormality PSYCH: pleasant and cooperative, no obvious depression or anxiety Lab Results  Component Value Date   WBC 10.0 05/01/2017   HGB 14.2 05/01/2017   HCT 42.6 05/01/2017   PLT 447.0 (H) 05/01/2017   GLUCOSE 133 (H) 05/01/2017   CHOL 312 (H) 05/01/2017   TRIG 266.0 (H) 05/01/2017   HDL 50.30 05/01/2017   LDLDIRECT 188.0 05/01/2017   LDLCALC 174 (H) 02/27/2016   ALT 17 05/01/2017   AST 16 05/01/2017   NA 135  05/01/2017   K 4.6 05/01/2017   CL 102 05/01/2017   CREATININE 0.76 05/01/2017   BUN 20 05/01/2017   CO2 23 05/01/2017   TSH 2.88 05/01/2017   HGBA1C 6.6 08/01/2017   MICROALBUR 1.4 05/01/2017   BP Readings from Last 3 Encounters:  08/01/17 110/62  05/01/17 (!) 148/90  12/12/16 (!) 154/80   Wt Readings from Last 3 Encounters:  08/01/17 163 lb (73.9 kg)  05/01/17 165 lb 1.6 oz (74.9 kg)  12/12/16 166 lb (75.3 kg)    ASSESSMENT AND PLAN:  Discussed the following assessment and plan:  Essential hypertension - improved  stay on same med coreg   Medication management - Plan: POC HgB A1c  Diabetes mellitus without complication ([place]) - Plan: POC HgB A1c  Need for influenza vaccination - Plan: Flu vaccine HIGH DOSE PF (Fluzone High dose) Next June   Hg a1c?  bp     No se  Doing weel.   Continue with same medicine .  -Patient advised to return or notify health care team  if  new concerns arise.  Patient Instructions  Continue same BP medication    ROV in  6 mos?     Standley Brooking. Tuan Tippin M.D.

## 2017-08-01 ENCOUNTER — Ambulatory Visit (INDEPENDENT_AMBULATORY_CARE_PROVIDER_SITE_OTHER): Payer: Medicare Other | Admitting: Internal Medicine

## 2017-08-01 ENCOUNTER — Encounter: Payer: Self-pay | Admitting: Internal Medicine

## 2017-08-01 VITALS — BP 110/62 | HR 74 | Temp 98.0°F | Wt 163.0 lb

## 2017-08-01 DIAGNOSIS — Z23 Encounter for immunization: Secondary | ICD-10-CM | POA: Diagnosis not present

## 2017-08-01 DIAGNOSIS — I10 Essential (primary) hypertension: Secondary | ICD-10-CM

## 2017-08-01 DIAGNOSIS — E119 Type 2 diabetes mellitus without complications: Secondary | ICD-10-CM

## 2017-08-01 DIAGNOSIS — Z79899 Other long term (current) drug therapy: Secondary | ICD-10-CM

## 2017-08-01 LAB — POCT GLYCOSYLATED HEMOGLOBIN (HGB A1C): Hemoglobin A1C: 6.6

## 2017-08-01 MED ORDER — ZOSTER VAC RECOMB ADJUVANTED 50 MCG/0.5ML IM SUSR: 0.5000 mL | Freq: Once | INTRAMUSCULAR | 1 refills | Status: AC

## 2017-08-01 NOTE — Patient Instructions (Signed)
Continue same BP medication    ROV in  6 mos?

## 2017-08-02 ENCOUNTER — Encounter: Payer: Self-pay | Admitting: Internal Medicine

## 2017-08-03 ENCOUNTER — Other Ambulatory Visit: Payer: Self-pay | Admitting: Internal Medicine

## 2017-08-09 ENCOUNTER — Other Ambulatory Visit: Payer: Self-pay | Admitting: Internal Medicine

## 2017-08-09 MED ORDER — METFORMIN HCL ER 500 MG PO TB24: 500.0000 mg | ORAL_TABLET | Freq: Two times a day (BID) | ORAL | 0 refills | Status: DC

## 2017-08-09 NOTE — Telephone Encounter (Signed)
Rx sent in

## 2017-08-09 NOTE — Telephone Encounter (Signed)
Pt need new Rx for metformin.   Pharm:  [facility]  Pt state that she is out of this medication.

## 2017-09-20 ENCOUNTER — Other Ambulatory Visit: Payer: Self-pay | Admitting: Internal Medicine

## 2017-11-10 ENCOUNTER — Other Ambulatory Visit: Payer: Self-pay | Admitting: Internal Medicine

## 2017-12-15 ENCOUNTER — Encounter (HOSPITAL_COMMUNITY): Payer: Self-pay | Admitting: Emergency Medicine

## 2017-12-15 ENCOUNTER — Emergency Department (HOSPITAL_COMMUNITY)
Admission: EM | Admit: 2017-12-15 | Discharge: 2017-12-15 | Disposition: A | Discharge status: Home / Self Care | Payer: Medicare Other | Attending: Emergency Medicine | Admitting: Emergency Medicine

## 2017-12-15 ENCOUNTER — Other Ambulatory Visit: Payer: Self-pay

## 2017-12-15 DIAGNOSIS — I10 Essential (primary) hypertension: Secondary | ICD-10-CM | POA: Diagnosis not present

## 2017-12-15 DIAGNOSIS — Z7984 Long term (current) use of oral hypoglycemic drugs: Secondary | ICD-10-CM | POA: Insufficient documentation

## 2017-12-15 DIAGNOSIS — K5792 Diverticulitis of intestine, part unspecified, without perforation or abscess without bleeding: Secondary | ICD-10-CM | POA: Insufficient documentation

## 2017-12-15 DIAGNOSIS — R1032 Left lower quadrant pain: Secondary | ICD-10-CM | POA: Diagnosis present

## 2017-12-15 DIAGNOSIS — E039 Hypothyroidism, unspecified: Secondary | ICD-10-CM | POA: Diagnosis not present

## 2017-12-15 DIAGNOSIS — Z79899 Other long term (current) drug therapy: Secondary | ICD-10-CM | POA: Diagnosis not present

## 2017-12-15 LAB — COMPREHENSIVE METABOLIC PANEL
ALT: 14 U/L (ref 14–54)
ANION GAP: 10 (ref 5–15)
AST: 18 U/L (ref 15–41)
Albumin: 4 g/dL (ref 3.5–5.0)
Alkaline Phosphatase: 38 U/L (ref 38–126)
BILIRUBIN TOTAL: 0.4 mg/dL (ref 0.3–1.2)
BUN: 16 mg/dL (ref 6–20)
CO2: 21 mmol/L — ABNORMAL LOW (ref 22–32)
Calcium: 9.1 mg/dL (ref 8.9–10.3)
Chloride: 104 mmol/L (ref 101–111)
Creatinine, Ser: 0.75 mg/dL (ref 0.44–1.00)
GFR calc Af Amer: >60 mL/min (ref >60)
Glucose, Bld: 136 mg/dL — ABNORMAL HIGH (ref 65–99)
POTASSIUM: 4 mmol/L (ref 3.5–5.1)
Sodium: 135 mmol/L (ref 135–145)
TOTAL PROTEIN: 7.4 g/dL (ref 6.5–8.1)

## 2017-12-15 LAB — CBC WITH DIFFERENTIAL/PLATELET
BASOS PCT: 0 %
Basophils Absolute: 0 10*3/uL (ref 0.0–0.1)
EOS PCT: 1 %
Eosinophils Absolute: 0.1 10*3/uL (ref 0.0–0.7)
HEMATOCRIT: 39.2 % (ref 36.0–46.0)
Hemoglobin: 13.3 g/dL (ref 12.0–15.0)
LYMPHS PCT: 30 %
Lymphs Abs: 4.8 10*3/uL — ABNORMAL HIGH (ref 0.7–4.0)
MCH: 29.5 pg (ref 26.0–34.0)
MCHC: 33.9 g/dL (ref 30.0–36.0)
MCV: 86.9 fL (ref 78.0–100.0)
MONOS PCT: 6 %
Monocytes Absolute: 1 10*3/uL (ref 0.1–1.0)
NEUTROS ABS: 9.9 10*3/uL — AB (ref 1.7–7.7)
Neutrophils Relative %: 63 %
PLATELETS: 358 10*3/uL (ref 150–400)
RBC: 4.51 MIL/uL (ref 3.87–5.11)
RDW: 13.6 % (ref 11.5–15.5)
WBC: 15.8 10*3/uL — ABNORMAL HIGH (ref 4.0–10.5)

## 2017-12-15 MED ORDER — ACETAMINOPHEN 500 MG PO TABS: 1000.0000 mg | ORAL_TABLET | Freq: Three times a day (TID) | ORAL | 0 refills | Status: AC

## 2017-12-15 MED ORDER — METRONIDAZOLE 500 MG PO TABS: 500.0000 mg | ORAL_TABLET | Freq: Two times a day (BID) | ORAL | 0 refills | Status: AC

## 2017-12-15 MED ORDER — SODIUM CHLORIDE 0.9 % IV BOLUS (SEPSIS): 1000.0000 mL | Freq: Once | INTRAVENOUS | Status: DC

## 2017-12-15 MED ORDER — CIPROFLOXACIN HCL 500 MG PO TABS
500.0000 mg | ORAL_TABLET | Freq: Once | ORAL | Status: AC
  Administered 2017-12-15: 500 mg via ORAL
  Filled 2017-12-15: qty 1

## 2017-12-15 MED ORDER — ONDANSETRON 4 MG PO TBDP: 4.0000 mg | ORAL_TABLET | Freq: Three times a day (TID) | ORAL | 0 refills | Status: AC | PRN

## 2017-12-15 MED ORDER — HYDROCODONE-ACETAMINOPHEN 5-325 MG PO TABS: 1.0000 | ORAL_TABLET | Freq: Three times a day (TID) | ORAL | 0 refills | Status: AC | PRN

## 2017-12-15 MED ORDER — CIPROFLOXACIN HCL 500 MG PO TABS: 500.0000 mg | ORAL_TABLET | Freq: Two times a day (BID) | ORAL | 0 refills | Status: AC

## 2017-12-15 MED ORDER — FENTANYL CITRATE (PF) 100 MCG/2ML IJ SOLN
50.0000 ug | Freq: Once | INTRAMUSCULAR | Status: AC
  Administered 2017-12-15: 50 ug via INTRAVENOUS
  Filled 2017-12-15: qty 2

## 2017-12-15 MED ORDER — METRONIDAZOLE 500 MG PO TABS
500.0000 mg | ORAL_TABLET | Freq: Once | ORAL | Status: AC
  Administered 2017-12-15: 500 mg via ORAL
  Filled 2017-12-15: qty 1

## 2017-12-15 NOTE — ED Triage Notes (Signed)
Pt is c/o left lower quadrant pain with tenderness and low grade fever  Pt state she did not feel well on Friday and Saturday she started having twinges of pain on the left side and today the pain is pretty constant and she is having some nausea and low grade fever  Pt states she has hx of diverticulitis and this feels the same

## 2017-12-15 NOTE — ED Provider Notes (Signed)
[facility] DEPT Provider Note  CSN: [id number] Arrival date & time: 12/15/17 7371  Chief Complaint(s) Abdominal Pain  HPI Valerie Brady is a 72 y.o. female   The history is provided by the patient.  Abdominal Pain   This is a recurrent (Similar to prior flares of diverticulitis) problem. The current episode started 2 days ago. Episode frequency: Intermittent at first but constant since last night. The problem has been gradually worsening. The pain is associated with an unknown factor. The pain is located in the LLQ. The quality of the pain is aching and sharp. The pain is moderate. Associated symptoms include nausea ( Mild). Pertinent negatives include fever, diarrhea, hematochezia, melena, vomiting and dysuria. The symptoms are aggravated by certain positions and palpation. The symptoms are relieved by being still. Past medical history comments: diverticulitis.    Past Medical History Past Medical History:  Diagnosis Date  . Carotidynia   . Diverticulitis of colon 05/22/2015  . Gestational diabetes   . Hyperlipidemia   . Hypertension   . Hypothyroidism   . Syncope 2011   reaction to benicar   Patient Active Problem List   Diagnosis Date Noted  . Hyperlipidemia 03/04/2016  . IBS (irritable bowel syndrome) 01/16/2015  . Essential hypertension 01/16/2015  . Unspecified hypothyroidism 03/05/2014  . Statin intolerance 03/05/2014  . History of recurrent UTIs 06/10/2013  . Pre-diabetes 02/10/2013  . Hypothyroidism   . ADVERSE REACTION TO MEDICATION 05/09/2010  . ALLERGIC RHINITIS, SEASONAL, MILD 09/08/2009  . CARCINOMA, SKIN, SQUAMOUS CELL 05/03/2009  . URINARY INCONTINENCE, URGE, MILD 05/03/2009  . HYPERLIPIDEMIA 05/07/2007  . HYPERTENSION 05/07/2007  . HYPERGLYCEMIA, FASTING 05/07/2007   Home Medication(s) Prior to Admission medications   Medication Sig Start Date End Date Taking? Authorizing Provider  carvedilol (COREG) 6.25 MG tablet  Take 1 tablet (6.25 mg total) by mouth 2 (two) times daily with a meal. 07/24/17  Yes Panosh, Standley Brooking, MD  cholecalciferol (VITAMIN D) 1000 units tablet Take 2,000 Units by mouth daily.    Yes [provider]  Cyanocobalamin (B-12 PO) Take 1 tablet by mouth daily.    Yes [provider]  gabapentin (NEURONTIN) 300 MG capsule Take 1 capsule (300 mg total) by mouth 3 (three) times daily. Or as directed Patient taking differently: Take 300 mg by mouth 3 (three) times daily as needed (headaches). Or as directed 05/01/17  Yes Panosh, Standley Brooking, MD  glucose blood (TRUETEST TEST) test strip Use to test blood sugar once daily 04/26/15  Yes Panosh, Standley Brooking, MD  LANCETS MICRO THIN 33G MISC Use to test blood glucose once daily. 10/22/16  Yes Panosh, Standley Brooking, MD  levothyroxine (SYNTHROID, LEVOTHROID) 112 MCG tablet TAKE 1 TABLET ONCE DAILY. 09/20/17  Yes Panosh, Standley Brooking, MD  Melatonin 5 MG TABS Take 5 mg by mouth at bedtime.   Yes [provider]  metFORMIN (GLUCOPHAGE-XR) 500 MG 24 hr tablet TAKE 1 TABLET TWICE DAILY WITH FOOD. 11/11/17  Yes Panosh, Standley Brooking, MD  NONFORMULARY OR COMPOUNDED ITEM Estriol 0.5mg /ml Cream: Insert 1 vaginally three times weekly 11/15/16  Yes Panosh, Standley Brooking, MD  acetaminophen (TYLENOL) 500 MG tablet Take 2 tablets (1,000 mg total) by mouth every 8 (eight) hours for 5 days. Do not take more than 4000 mg of acetaminophen (Tylenol) in a 24-hour period. Please note that other medicines that you may be prescribed may have Tylenol as well. 12/15/17 12/20/17  Fatima Blank, MD  ciprofloxacin (CIPRO) 500 MG tablet  Take 1 tablet (500 mg total) by mouth every 12 (twelve) hours for 10 days. 12/15/17 12/25/17  Fatima Blank, MD  HYDROcodone-acetaminophen (NORCO/VICODIN) 5-325 MG tablet Take 1 tablet by mouth every 8 (eight) hours as needed for up to 5 days for severe pain (That is not improved by your scheduled acetaminophen regimen). Please do not exceed 4000 mg of  acetaminophen (Tylenol) a 24-hour period. Please note that he may be prescribed additional medicine that contains acetaminophen. 12/15/17 12/20/17  Fatima Blank, MD  metroNIDAZOLE (FLAGYL) 500 MG tablet Take 1 tablet (500 mg total) by mouth 2 (two) times daily for 10 days. 12/15/17 12/25/17  Fatima Blank, MD  ondansetron (ZOFRAN ODT) 4 MG disintegrating tablet Take 1 tablet (4 mg total) by mouth every 8 (eight) hours as needed for up to 3 days for nausea or vomiting. 12/15/17 12/18/17  Fatima Blank, MD                                                                                                                                    Past Surgical History Past Surgical History:  Procedure Laterality Date  . cervical polyp removed    . FIBULA FRACTURE SURGERY     OPEN REDUCTION AND PINNING OF SPIRAL FX LEFT FIBULA  . TONSILLECTOMY    . TUBAL LIGATION     Family History Family History  Problem Relation Age of Onset  . Hypertension Mother   . Dementia Mother   . Heart disease Father   . Dementia Father   . Diabetes Father   . Hypertension Brother     Social History Social History   Tobacco Use  . Smoking status: Never Smoker  . Smokeless tobacco: Never Used  Substance Use Topics  . Alcohol use: Yes    Alcohol/week: 0.0 oz    Comment: occ wine  . Drug use: No   Allergies Bee venom; Eggs or egg-derived products; Enalapril maleate; Lactose intolerance (gi); Olmesartan medoxomil; Almond (diagnostic); Amlodipine; and Zetia [ezetimibe]  Review of Systems Review of Systems  Constitutional: Negative for fever.  Gastrointestinal: Positive for abdominal pain and nausea ( Mild). Negative for diarrhea, hematochezia, melena and vomiting.  Genitourinary: Negative for dysuria.   All other systems are reviewed and are negative for acute change except as noted in the HPI  Physical Exam Vital Signs  I have reviewed the triage vital signs BP 128/68   Pulse 79   Temp 98.4  F (36.9 C) (Oral)   Resp 16   Ht 5' 5.5" (1.664 m)   Wt 74.8 kg (165 lb)   SpO2 94%   BMI 27.04 kg/m   Physical Exam  Constitutional: She is oriented to person, place, and time. She appears well-developed and well-nourished. No distress.  HENT:  Head: Normocephalic and atraumatic.  Right Ear: External ear normal.  Left Ear: External ear normal.  Nose: Nose normal.  Eyes: Conjunctivae  and EOM are normal. No scleral icterus.  Neck: Normal range of motion and phonation normal.  Cardiovascular: Normal rate and regular rhythm.  Pulmonary/Chest: Effort normal. No stridor. No respiratory distress.  Abdominal: She exhibits no distension. There is tenderness in the left lower quadrant. There is rebound. There is no rigidity, no guarding and negative Murphy's sign. No hernia.  Musculoskeletal: Normal range of motion. She exhibits no edema.  Neurological: She is alert and oriented to person, place, and time.  Skin: She is not diaphoretic.  Psychiatric: She has a normal mood and affect. Her behavior is normal.  Vitals reviewed.   ED Results and Treatments Labs (all labs ordered are listed, but only abnormal results are displayed) Labs Reviewed  CBC WITH DIFFERENTIAL/PLATELET - Abnormal; Notable for the following components:      Result Value   WBC 15.8 (*)    Neutro Abs 9.9 (*)    Lymphs Abs 4.8 (*)    All other components within normal limits  COMPREHENSIVE METABOLIC PANEL - Abnormal; Notable for the following components:   CO2 21 (*)    Glucose, Bld 136 (*)    All other components within normal limits                                                                                                                         EKG  EKG Interpretation  Date/Time:    Ventricular Rate:    PR Interval:    QRS Duration:   QT Interval:    QTC Calculation:   R Axis:     Text Interpretation:        Radiology No results found. Pertinent labs & imaging results that were available  during my care of the patient were reviewed by me and considered in my medical decision making (see chart for details).  Medications Ordered in ED Medications  fentaNYL (SUBLIMAZE) injection 50 mcg (50 mcg Intravenous Given 12/15/17 0831)  ciprofloxacin (CIPRO) tablet 500 mg (500 mg Oral Given 12/15/17 0831)  metroNIDAZOLE (FLAGYL) tablet 500 mg (500 mg Oral Given 12/15/17 0831)                                                                                                                                    Procedures Procedures  (including critical care time)  Medical Decision Making / ED Course I have reviewed the nursing notes for this encounter  and the patient's prior records (if available in EHR or on provided paperwork).    Patient with left lower quadrant abdominal pain.  Likely diverticulitis flare.  Labs with leukocytosis, otherwise reassuring.  Discussed advanced imaging and with shared decision making patient chose to withhold any imaging at this time.  She states that she has been treated previously successfully for diverticulitis without any imaging and she would like to do so this time.   Given first dose of Cipro/flagyl here in the ED. Able to tolerate PO intake. Given pain meds.  The patient appears reasonably screened and/or stabilized for discharge and I doubt any other medical condition or other [facility] requiring further screening, evaluation, or treatment in the ED at this time prior to discharge.  The patient is safe for discharge with strict return precautions.   Final Clinical Impression(s) / ED Diagnoses Final diagnoses:  Diverticulitis     Disposition: Discharge  Condition: Good  I have discussed the results, Dx and Tx plan with the patient who expressed understanding and agree(s) with the plan. Discharge instructions discussed at great length. The patient was given strict return precautions who verbalized understanding of the instructions. No further questions at  time of discharge.    ED Discharge Orders        Ordered    ondansetron (ZOFRAN ODT) 4 MG disintegrating tablet  Every 8 hours PRN     12/15/17 1021    ciprofloxacin (CIPRO) 500 MG tablet  Every 12 hours     12/15/17 1021    metroNIDAZOLE (FLAGYL) 500 MG tablet  2 times daily     12/15/17 1021    acetaminophen (TYLENOL) 500 MG tablet  Every 8 hours     12/15/17 1021    HYDROcodone-acetaminophen (NORCO/VICODIN) 5-325 MG tablet  Every 8 hours PRN     12/15/17 1021       Follow Up: Burnis Medin, MD [place] [address] [phone]  Schedule an appointment as soon as possible for a visit  in 5-7 days, If symptoms do not improve or  worsen    This chart was dictated using voice recognition software.  Despite best efforts to proofread,  errors can occur which can change the documentation meaning.   Fatima Blank, MD 12/15/17 1021

## 2017-12-15 NOTE — Discharge Instructions (Signed)

## 2017-12-26 ENCOUNTER — Other Ambulatory Visit: Payer: Self-pay | Admitting: Internal Medicine

## 2018-01-08 ENCOUNTER — Telehealth: Payer: Self-pay | Admitting: Internal Medicine

## 2018-01-08 ENCOUNTER — Encounter: Payer: Self-pay | Admitting: Internal Medicine

## 2018-01-08 MED ORDER — ESTRADIOL 0.1 MG/GM VA CREA: 1.0000 | TOPICAL_CREAM | VAGINAL | 3 refills | Status: DC

## 2018-01-08 NOTE — Telephone Encounter (Signed)
Copied from [facility]. Topic: General - Other >> Jan 08, 2018 10:58 AM Darl Householder, RMA wrote: Reason for CRM: Medication refill request for estradiol (ESTRACE VAGINAL) 0.1 MG/GM vaginal cream to be sent to [facility]

## 2018-01-08 NOTE — Telephone Encounter (Signed)
Not on current med list, but per Dr. Regis Bill: "Ashtyn dont know what to do with this refill Request It came in as failed requests  Ok to refill whatever prep she is on  X 1 year   Thanks [facility]"  Medication filled to pharmacy as requested w/ previous sig.

## 2018-01-09 NOTE — Telephone Encounter (Signed)
See pt. Request - medication needs to be changed on medication list so it can be refilled.

## 2018-01-09 NOTE — Telephone Encounter (Signed)
Patient called back and said that the pharmacy filled Estradiol instead of the  Estriol 0.5mg /ml Cream: Insert 1 vaginally three times weekly. Pls send this medication to [facility]

## 2018-01-10 MED ORDER — NONFORMULARY OR COMPOUNDED ITEM: 3 refills | Status: DC

## 2018-01-10 NOTE — Telephone Encounter (Signed)
Estriol has been phoned into [facility] as requested. Medlist updated to remove Estradiol and add Estriol correctly.   Pt aware. Advised that I will work on trying to figure out how to avoid complications with her medication refill in the future.

## 2018-01-10 NOTE — Telephone Encounter (Signed)
Estriol has been phoned into [facility] as requested. Medlist updated to remove Estradiol and add Estriol correctly.   Pt aware. Advised that I will work on trying to figure out how to avoid complications with her medication refill in the future.

## 2018-01-10 NOTE — Telephone Encounter (Signed)
Estriol has been phoned into [facility] as requested. Medlist updated to remove Estradiol and add Estriol correctly.   Pt aware. Advised that I will work on trying to figure out how to avoid complications with her medication refill in the future.

## 2018-01-28 ENCOUNTER — Other Ambulatory Visit: Payer: Self-pay | Admitting: Internal Medicine

## 2018-02-25 ENCOUNTER — Encounter: Payer: Self-pay | Admitting: Family Medicine

## 2018-02-25 ENCOUNTER — Ambulatory Visit (INDEPENDENT_AMBULATORY_CARE_PROVIDER_SITE_OTHER): Payer: Medicare Other | Admitting: Family Medicine

## 2018-02-25 VITALS — BP 108/64 | HR 71 | Temp 98.3°F | Ht 65.5 in | Wt 162.1 lb

## 2018-02-25 DIAGNOSIS — N390 Urinary tract infection, site not specified: Secondary | ICD-10-CM | POA: Diagnosis not present

## 2018-02-25 DIAGNOSIS — R319 Hematuria, unspecified: Secondary | ICD-10-CM

## 2018-02-25 DIAGNOSIS — R300 Dysuria: Secondary | ICD-10-CM

## 2018-02-25 LAB — POCT URINALYSIS DIPSTICK
GLUCOSE UA: NEGATIVE
Ketones, UA: NEGATIVE
NITRITE UA: POSITIVE
Urobilinogen, UA: 2 E.U./dL — AB
pH, UA: 5.5 (ref 5.0–8.0)

## 2018-02-25 MED ORDER — CIPROFLOXACIN HCL 500 MG PO TABS: 500.0000 mg | ORAL_TABLET | Freq: Two times a day (BID) | ORAL | 0 refills | Status: DC

## 2018-02-25 NOTE — Progress Notes (Signed)
   Subjective:    Patient ID: Valerie Brady, female    DOB: 05-30-1946, 72 y.o.   MRN: [id number]  HPI Here for 2 days of urgency and burning on urination. No fever.    Review of Systems  Constitutional: Negative.   Respiratory: Negative.   Cardiovascular: Negative.   Gastrointestinal: Negative.   Genitourinary: Positive for dysuria, frequency and urgency. Negative for flank pain.       Objective:   Physical Exam  Constitutional: She appears well-developed and well-nourished.  Cardiovascular: Normal rate, regular rhythm, normal heart sounds and intact distal pulses.  Pulmonary/Chest: Effort normal and breath sounds normal. No respiratory distress. She has no wheezes. She has no rales.  Abdominal: Soft. Bowel sounds are normal. She exhibits no distension and no mass. There is no tenderness. There is no rebound and no guarding.          Assessment & Plan:  UTI. Treat with Cipro. Drink plenty of fluids. Culture the sample.  Alysia Penna, MD

## 2018-02-28 LAB — URINE CULTURE
MICRO NUMBER:: 90467012
SPECIMEN QUALITY:: ADEQUATE

## 2018-03-03 ENCOUNTER — Other Ambulatory Visit: Payer: Self-pay | Admitting: *Deleted

## 2018-03-03 MED ORDER — NITROFURANTOIN MONOHYD MACRO 100 MG PO CAPS: 100.0000 mg | ORAL_CAPSULE | Freq: Two times a day (BID) | ORAL | 0 refills | Status: DC

## 2018-03-25 ENCOUNTER — Other Ambulatory Visit: Payer: Self-pay | Admitting: Internal Medicine

## 2018-04-29 ENCOUNTER — Other Ambulatory Visit: Payer: Self-pay | Admitting: Internal Medicine

## 2018-04-30 MED ORDER — METFORMIN HCL ER 500 MG PO TB24: 500.0000 mg | ORAL_TABLET | Freq: Two times a day (BID) | ORAL | 0 refills | Status: DC

## 2018-04-30 NOTE — Addendum Note (Signed)
Addended by: Aggie Hacker A on: 04/30/2018 03:59 PM   Modules accepted: Orders

## 2018-04-30 NOTE — Telephone Encounter (Signed)
Please call patient to clarify the metformin dosage since it was denied.

## 2018-04-30 NOTE — Telephone Encounter (Signed)
I spoke with patient and did clarify the directions for Metformin, take 1 twice a day and I did send script e-scribe to [facility].

## 2018-05-05 ENCOUNTER — Ambulatory Visit: Payer: Medicare Other | Admitting: Internal Medicine

## 2018-05-19 ENCOUNTER — Other Ambulatory Visit: Payer: Self-pay | Admitting: Family Medicine

## 2018-05-19 NOTE — Telephone Encounter (Signed)
Refill request for True Metrix glucose test stripts-send to [facility].

## 2018-05-20 MED ORDER — GLUCOSE BLOOD VI STRP: ORAL_STRIP | 0 refills | Status: DC

## 2018-05-20 NOTE — Progress Notes (Signed)
Chief Complaint  Patient presents with  . Follow-up    diabeties    HPI: Valerie Brady 72 y.o. come in for Chronic disease management  Since last visit has had   uti diverticulitis and   fbvs was up.   Going up.   And moved   recenetly  .Marland Kitchen Husband neuropathy.  Better physical lay out.  Positive changes.   But still wasn't able to control bg as well taking metformin regularly however   HLD:    Has had statin intolerance but willing to try low dose after  reinstitution  LSI     ROS: See pertinent positives and negatives per HPI. No cp sob exercise sx  ocass tingling toes   Past Medical History:  Diagnosis Date  . Carotidynia   . Diverticulitis of colon 05/22/2015  . Gestational diabetes   . Hyperlipidemia   . Hypertension   . Hypothyroidism   . Syncope 2011   reaction to benicar    Family History  Problem Relation Age of Onset  . Hypertension Mother   . Dementia Mother   . Heart disease Father   . Dementia Father   . Diabetes Father   . Hypertension Brother     Social History   Socioeconomic History  . Marital status: Married    Spouse name: Not on file  . Number of children: Not on file  . Years of education: Not on file  . Highest education level: Not on file  Occupational History  . Occupation: retired Engineer, drilling    Comment: internist  Social Needs  . Financial resource strain: Not on file  . Food insecurity:    Worry: Not on file    Inability: Not on file  . Transportation needs:    Medical: Not on file    Non-medical: Not on file  Tobacco Use  . Smoking status: Never Smoker  . Smokeless tobacco: Never Used  Substance and Sexual Activity  . Alcohol use: Yes    Alcohol/week: 0.0 oz    Comment: occ wine  . Drug use: No  . Sexual activity: Never    Birth control/protection: Abstinence  Lifestyle  . Physical activity:    Days per week: Not on file    Minutes per session: Not on file  . Stress: Not on file  Relationships  . Social  connections:    Talks on phone: Not on file    Gets together: Not on file    Attends religious service: Not on file    Active member of club or organization: Not on file    Attends meetings of clubs or organizations: Not on file    Relationship status: Not on file  Other Topics Concern  . Not on file  Social History Narrative   Did not rnew lisence 2017   Neg tobacco    Married   HH of 5   Pet cat   Mom with dementia    Outpatient Medications Prior to Visit  Medication Sig Dispense Refill  . carvedilol (COREG) 6.25 MG tablet TAKE 1 TABLET BY MOUTH TWICE DAILY WITH A MEAL. 180 tablet 0  . cholecalciferol (VITAMIN D) 1000 units tablet Take 2,000 Units by mouth daily.     . Cyanocobalamin (B-12 PO) Take 1 tablet by mouth daily.     Marland Kitchen gabapentin (NEURONTIN) 300 MG capsule Take 1 capsule (300 mg total) by mouth 3 (three) times daily. Or as directed (Patient taking differently: Take 300 mg by  mouth 3 (three) times daily as needed (headaches). Or as directed) 90 capsule 1  . glucose blood (TRUETEST TEST) test strip Use to test blood sugar once daily and diagnosis code is R73.03 100 each 0  . LANCETS MICRO THIN 33G MISC Use to test blood glucose once daily. 100 each 3  . levothyroxine (SYNTHROID, LEVOTHROID) 112 MCG tablet Take 1 tablet (112 mcg total) by mouth daily. **DUE FOR OFFICE VISIT FOR FURTHER REFILLS 90 tablet 0  . Melatonin 5 MG TABS Take 5 mg by mouth at bedtime.    . metFORMIN (GLUCOPHAGE-XR) 500 MG 24 hr tablet Take 1 tablet (500 mg total) by mouth 2 (two) times daily with a meal. 60 tablet 0  . NONFORMULARY OR COMPOUNDED ITEM Estriol 0.5mg /ml Cream: Insert 1 vaginally three times weekly 36 each 3  . ciprofloxacin (CIPRO) 500 MG tablet Take 1 tablet (500 mg total) by mouth 2 (two) times daily. (Patient not taking: Reported on 05/22/2018) 14 tablet 0  . nitrofurantoin, macrocrystal-monohydrate, (MACROBID) 100 MG capsule Take 1 capsule (100 mg total) by mouth 2 (two) times daily.  (Patient not taking: Reported on 05/22/2018) 14 capsule 0   No facility-administered medications prior to visit.      EXAM:  BP 133/78   Pulse 77   Temp 98.4 F (36.9 C)   Wt 160 lb (72.6 kg)   BMI 26.22 kg/m   Body mass index is 26.22 kg/m.  GENERAL: vitals reviewed and listed above, alert, oriented, appears well hydrated and in no acute distress HEENT: atraumatic, conjunctiva  clear, no obvious abnormalities on inspection of external nose and ears  NECK: no obvious masses on inspection palpation  LUNGS: clear to auscultation bilaterally, no wheezes, rales or rhonchi, good air movement CV: HRRR, no clubbing cyanosis or  peripheral edema nl cap refill  Abdomen:  Sof,t normal bowel sounds without hepatosplenomegaly, no guarding rebound or masses no CVA tenderness MS: moves all extremities without noticeable focal  abnormality PSYCH: pleasant and cooperative, no obvious depression or anxiety Lab Results  Component Value Date   WBC 15.8 (H) 12/15/2017   HGB 13.3 12/15/2017   HCT 39.2 12/15/2017   PLT 358 12/15/2017   GLUCOSE 136 (H) 12/15/2017   CHOL 312 (H) 05/01/2017   TRIG 266.0 (H) 05/01/2017   HDL 50.30 05/01/2017   LDLDIRECT 188.0 05/01/2017   LDLCALC 174 (H) 02/27/2016   ALT 14 12/15/2017   AST 18 12/15/2017   NA 135 12/15/2017   K 4.0 12/15/2017   CL 104 12/15/2017   CREATININE 0.75 12/15/2017   BUN 16 12/15/2017   CO2 21 (L) 12/15/2017   TSH 1.10 05/22/2018   HGBA1C 6.6 (H) 05/22/2018   MICROALBUR 1.4 05/01/2017   BP Readings from Last 3 Encounters:  05/22/18 133/78  02/25/18 108/64  12/15/17 128/68   Wt Readings from Last 3 Encounters:  05/22/18 160 lb (72.6 kg)  02/25/18 162 lb 1.6 oz (73.5 kg)  12/15/17 165 lb (74.8 kg)     ASSESSMENT AND PLAN:  Discussed the following assessment and plan:  Essential hypertension - Plan: Lipid panel, Basic metabolic panel, Hemoglobin A1c, Hepatic function panel  Medication management - Plan: TSH, Hemoglobin  A1c, Lipid panel, Basic metabolic panel, Hemoglobin A1c, Hepatic function panel  Hyperlipidemia, unspecified hyperlipidemia type - Plan: TSH, Hemoglobin A1c, Lipid panel, Basic metabolic panel, Hemoglobin A1c, Hepatic function panel  Statin intolerance - Plan: TSH, Hemoglobin A1c  Hypothyroidism, unspecified type - Plan: TSH, Hemoglobin A1c  Diabetes mellitus without  complication ([place]) - Plan: Lipid panel, Basic metabolic panel, Hemoglobin A1c, Hepatic function panel Plan to add  Low dose statin  When possible and plan fu  Lipid etc  -Patient advised to return or notify health care team  if  new concerns arise.  Patient Instructions  Let me know when you want a trial   Of low dose statin .   Suggest   crestor 5 mg  3 days a week.  To start.   Ok to remain on same thyroid dose  .   Plan   fasting labs  After 2-3 months   On medication.   Then yearly exam visit ...  Standley Brooking. Panosh M.D.

## 2018-05-20 NOTE — Telephone Encounter (Signed)
I sent script e-scribe to [facility].

## 2018-05-22 ENCOUNTER — Ambulatory Visit (INDEPENDENT_AMBULATORY_CARE_PROVIDER_SITE_OTHER): Payer: Medicare Other | Admitting: Internal Medicine

## 2018-05-22 VITALS — BP 133/78 | HR 77 | Temp 98.4°F | Wt 160.0 lb

## 2018-05-22 DIAGNOSIS — E119 Type 2 diabetes mellitus without complications: Secondary | ICD-10-CM | POA: Diagnosis not present

## 2018-05-22 DIAGNOSIS — Z79899 Other long term (current) drug therapy: Secondary | ICD-10-CM | POA: Diagnosis not present

## 2018-05-22 DIAGNOSIS — Z789 Other specified health status: Secondary | ICD-10-CM | POA: Diagnosis not present

## 2018-05-22 DIAGNOSIS — E039 Hypothyroidism, unspecified: Secondary | ICD-10-CM

## 2018-05-22 DIAGNOSIS — E785 Hyperlipidemia, unspecified: Secondary | ICD-10-CM | POA: Diagnosis not present

## 2018-05-22 DIAGNOSIS — I10 Essential (primary) hypertension: Secondary | ICD-10-CM

## 2018-05-22 LAB — TSH: TSH: 1.1 u[IU]/mL (ref 0.35–4.50)

## 2018-05-22 LAB — HEMOGLOBIN A1C: HEMOGLOBIN A1C: 6.6 % — AB (ref 4.6–6.5)

## 2018-05-22 NOTE — Patient Instructions (Addendum)
Let me know when you want a trial   Of low dose statin .   Suggest   crestor 5 mg  3 days a week.  To start.   Ok to remain on same thyroid dose  .   Plan   fasting labs  After 2-3 months   On medication.   Then yearly exam visit .Marland KitchenMarland Kitchen

## 2018-05-23 ENCOUNTER — Encounter: Payer: Self-pay | Admitting: Internal Medicine

## 2018-06-03 ENCOUNTER — Other Ambulatory Visit: Payer: Self-pay | Admitting: Internal Medicine

## 2018-06-05 ENCOUNTER — Other Ambulatory Visit: Payer: Self-pay | Admitting: Internal Medicine

## 2018-06-06 ENCOUNTER — Other Ambulatory Visit: Payer: Self-pay | Admitting: Internal Medicine

## 2018-06-22 DIAGNOSIS — N390 Urinary tract infection, site not specified: Secondary | ICD-10-CM | POA: Diagnosis not present

## 2018-07-01 ENCOUNTER — Other Ambulatory Visit: Payer: Self-pay | Admitting: Internal Medicine

## 2018-07-07 DIAGNOSIS — H2513 Age-related nuclear cataract, bilateral: Secondary | ICD-10-CM | POA: Diagnosis not present

## 2018-07-07 DIAGNOSIS — H43811 Vitreous degeneration, right eye: Secondary | ICD-10-CM | POA: Diagnosis not present

## 2018-07-07 DIAGNOSIS — H40013 Open angle with borderline findings, low risk, bilateral: Secondary | ICD-10-CM | POA: Diagnosis not present

## 2018-07-07 DIAGNOSIS — E119 Type 2 diabetes mellitus without complications: Secondary | ICD-10-CM | POA: Diagnosis not present

## 2018-07-07 LAB — HM DIABETES EYE EXAM

## 2018-07-17 DIAGNOSIS — K580 Irritable bowel syndrome with diarrhea: Secondary | ICD-10-CM | POA: Diagnosis not present

## 2018-07-17 DIAGNOSIS — R195 Other fecal abnormalities: Secondary | ICD-10-CM | POA: Diagnosis not present

## 2018-08-02 DIAGNOSIS — N390 Urinary tract infection, site not specified: Secondary | ICD-10-CM | POA: Diagnosis not present

## 2018-08-02 DIAGNOSIS — Z6826 Body mass index (BMI) 26.0-26.9, adult: Secondary | ICD-10-CM | POA: Diagnosis not present

## 2018-08-05 NOTE — Telephone Encounter (Signed)
Please advise Dr Panosh, thanks.   

## 2018-08-05 NOTE — Telephone Encounter (Signed)
Would it make more sense to  Use  More often ?  qhs for 10 days and then   Every other day?    But ok to try increase to  Higher dose    Other wise can send in 0.75 mg estriol 3 x per week  Intravaginally   disp 3 mos  Refill  X 6 mos

## 2018-08-11 ENCOUNTER — Other Ambulatory Visit: Payer: Self-pay | Admitting: Internal Medicine

## 2018-08-13 MED ORDER — NONFORMULARY OR COMPOUNDED ITEM: 1 refills | Status: DC

## 2018-08-20 ENCOUNTER — Other Ambulatory Visit: Payer: Self-pay | Admitting: Internal Medicine

## 2018-09-07 DIAGNOSIS — Z23 Encounter for immunization: Secondary | ICD-10-CM | POA: Diagnosis not present

## 2018-09-15 ENCOUNTER — Encounter: Payer: Self-pay | Admitting: Internal Medicine

## 2018-09-15 DIAGNOSIS — R195 Other fecal abnormalities: Secondary | ICD-10-CM | POA: Diagnosis not present

## 2018-09-15 DIAGNOSIS — D126 Benign neoplasm of colon, unspecified: Secondary | ICD-10-CM | POA: Diagnosis not present

## 2018-09-15 LAB — HM COLONOSCOPY

## 2018-09-17 DIAGNOSIS — D126 Benign neoplasm of colon, unspecified: Secondary | ICD-10-CM | POA: Diagnosis not present

## 2018-09-24 ENCOUNTER — Encounter: Payer: Self-pay | Admitting: Family Medicine

## 2018-09-24 ENCOUNTER — Other Ambulatory Visit: Payer: Medicare Other

## 2018-09-24 ENCOUNTER — Ambulatory Visit (INDEPENDENT_AMBULATORY_CARE_PROVIDER_SITE_OTHER): Payer: Medicare Other | Admitting: Family Medicine

## 2018-09-24 VITALS — BP 120/70 | HR 67 | Temp 97.8°F | Wt 161.0 lb

## 2018-09-24 DIAGNOSIS — Z79899 Other long term (current) drug therapy: Secondary | ICD-10-CM

## 2018-09-24 DIAGNOSIS — I10 Essential (primary) hypertension: Secondary | ICD-10-CM

## 2018-09-24 DIAGNOSIS — R300 Dysuria: Secondary | ICD-10-CM | POA: Diagnosis not present

## 2018-09-24 DIAGNOSIS — E785 Hyperlipidemia, unspecified: Secondary | ICD-10-CM

## 2018-09-24 DIAGNOSIS — N3000 Acute cystitis without hematuria: Secondary | ICD-10-CM | POA: Insufficient documentation

## 2018-09-24 DIAGNOSIS — E119 Type 2 diabetes mellitus without complications: Secondary | ICD-10-CM

## 2018-09-24 DIAGNOSIS — E1169 Type 2 diabetes mellitus with other specified complication: Secondary | ICD-10-CM

## 2018-09-24 LAB — POCT URINALYSIS DIPSTICK
Glucose, UA: POSITIVE — AB
KETONES UA: POSITIVE
Nitrite, UA: POSITIVE
Protein, UA: NEGATIVE
Spec Grav, UA: <=1.005 — AB (ref 1.010–1.025)
UROBILINOGEN UA: 2 U/dL — AB
pH, UA: 5 (ref 5.0–8.0)

## 2018-09-24 MED ORDER — LEVOTHYROXINE SODIUM 112 MCG PO TABS: 112.0000 ug | ORAL_TABLET | Freq: Every day | ORAL | 3 refills | Status: DC

## 2018-09-24 MED ORDER — METFORMIN HCL ER 500 MG PO TB24: ORAL_TABLET | ORAL | 3 refills | Status: DC

## 2018-09-24 MED ORDER — CARVEDILOL 6.25 MG PO TABS: 6.2500 mg | ORAL_TABLET | Freq: Two times a day (BID) | ORAL | 3 refills | Status: DC

## 2018-09-24 MED ORDER — ROSUVASTATIN CALCIUM 5 MG PO TABS: 5.0000 mg | ORAL_TABLET | Freq: Every day | ORAL | 3 refills | Status: DC

## 2018-09-24 MED ORDER — GABAPENTIN 300 MG PO CAPS: 300.0000 mg | ORAL_CAPSULE | Freq: Three times a day (TID) | ORAL | 1 refills | Status: DC

## 2018-09-24 MED ORDER — NITROFURANTOIN MONOHYD MACRO 100 MG PO CAPS: 100.0000 mg | ORAL_CAPSULE | Freq: Two times a day (BID) | ORAL | 1 refills | Status: DC

## 2018-09-24 NOTE — Progress Notes (Signed)
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  She took OTC Azo-Standard because of the dysuria.  No fever chills  She has had 4 urinary tract infection since last March.  On 2 of them she had fever and chills for a day.  She states she had difficulty with delivery of 1 of her children.  She now has some urinary incontinence when she does not have infection.  We discussed various options.  She elects to have a urologic consult  She is using vaginal estrogen 0.75 mg.  She wants to know if we should increase that to a full milligram.  I will leave this up to Dr. Regis Bill in her.  She is here also today to get LFTs and lipid panel because she was going to start a statin however she did not start the statin. Last A1c below 7  In reviewing her urine culture from last spring it grew a E. coli with multiple resistant drugs.  The only thing was really sensitive to orally with Macrodantin.  .vs BP 120/70 (BP Location: Right Arm, Patient Position: Sitting, Cuff Size: Normal)   Pulse 67   Temp 97.8 F (36.6 C) (Oral)   Wt 161 lb (73 kg)   SpO2 96%   BMI 26.38 kg/m  Well-developed will nourished female no acute distress vital signs stable she is afebrile.  No exam done today  1.  UTI recurrent........ urine culture...Marland KitchenMarland KitchenMarland Kitchen start Macrobid x10 days....Marland KitchenMarland Kitchen urologic consult  2.  History of hyperlipidemia with underlying type 2 diabetes marked elevation of LDL......... Crestor 5 mg Monday Wednesday Friday......Marland Kitchen return in 2 months for follow-up labs

## 2018-09-24 NOTE — Patient Instructions (Addendum)
Macrobid..........Marland Kitchen 1 twice daily for 10 days  Drink lots of water  I will put in a urologic consult  When you finish the Macrobid start the Crestor........... 5 mg Monday Wednesday Friday.  If you develop side effects from the Crestor Monday Wednesday Friday joint pain and muscle aches stop the medication and call Dr. Anselmo Rod  Return in 2 months for follow-up labs

## 2018-09-25 LAB — URINE CULTURE
MICRO NUMBER: 91367658
SPECIMEN QUALITY: ADEQUATE

## 2018-09-29 ENCOUNTER — Telehealth: Payer: Self-pay | Admitting: Internal Medicine

## 2018-09-29 NOTE — Telephone Encounter (Signed)
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

## 2018-09-29 NOTE — Telephone Encounter (Signed)
-----   Message from Franco Collet, [place] sent at 09/26/2018  3:04 PM EST ----- [id number] is back if you haven't had the chance to look at it!

## 2018-10-08 NOTE — Telephone Encounter (Addendum)
Patient aware already by Dr Sherren Mocha Pt saw Dr Sherren Mocha 09/24/18 was referred by Dr Sherren Mocha to Urology -- appt is on 11/03/18 Pt aware that in the meantime if symptoms return to contact our office and we will repeat urine with micro. Will send to Dr Regis Bill as Juluis Rainier.

## 2018-10-28 ENCOUNTER — Encounter: Payer: Self-pay | Admitting: Internal Medicine

## 2018-11-03 DIAGNOSIS — N3020 Other chronic cystitis without hematuria: Secondary | ICD-10-CM | POA: Diagnosis not present

## 2018-11-03 DIAGNOSIS — R300 Dysuria: Secondary | ICD-10-CM | POA: Diagnosis not present

## 2018-11-13 ENCOUNTER — Other Ambulatory Visit: Payer: Self-pay

## 2018-11-13 MED ORDER — NONFORMULARY OR COMPOUNDED ITEM: 1 refills | Status: DC

## 2018-11-20 DIAGNOSIS — N3941 Urge incontinence: Secondary | ICD-10-CM | POA: Diagnosis not present

## 2018-11-20 DIAGNOSIS — M62838 Other muscle spasm: Secondary | ICD-10-CM | POA: Diagnosis not present

## 2018-11-20 DIAGNOSIS — M6281 Muscle weakness (generalized): Secondary | ICD-10-CM | POA: Diagnosis not present

## 2018-11-20 DIAGNOSIS — R102 Pelvic and perineal pain: Secondary | ICD-10-CM | POA: Diagnosis not present

## 2018-11-20 DIAGNOSIS — R3915 Urgency of urination: Secondary | ICD-10-CM | POA: Diagnosis not present

## 2018-11-26 DIAGNOSIS — M62838 Other muscle spasm: Secondary | ICD-10-CM | POA: Diagnosis not present

## 2018-11-26 DIAGNOSIS — R102 Pelvic and perineal pain: Secondary | ICD-10-CM | POA: Diagnosis not present

## 2018-11-26 DIAGNOSIS — M6281 Muscle weakness (generalized): Secondary | ICD-10-CM | POA: Diagnosis not present

## 2018-11-26 DIAGNOSIS — N3941 Urge incontinence: Secondary | ICD-10-CM | POA: Diagnosis not present

## 2018-11-26 DIAGNOSIS — R3915 Urgency of urination: Secondary | ICD-10-CM | POA: Diagnosis not present

## 2018-11-27 ENCOUNTER — Encounter: Payer: Self-pay | Admitting: Internal Medicine

## 2018-12-09 DIAGNOSIS — M6281 Muscle weakness (generalized): Secondary | ICD-10-CM | POA: Diagnosis not present

## 2018-12-09 DIAGNOSIS — M62838 Other muscle spasm: Secondary | ICD-10-CM | POA: Diagnosis not present

## 2018-12-09 DIAGNOSIS — R3915 Urgency of urination: Secondary | ICD-10-CM | POA: Diagnosis not present

## 2018-12-09 DIAGNOSIS — N3941 Urge incontinence: Secondary | ICD-10-CM | POA: Diagnosis not present

## 2018-12-09 DIAGNOSIS — R102 Pelvic and perineal pain: Secondary | ICD-10-CM | POA: Diagnosis not present

## 2018-12-10 DIAGNOSIS — K580 Irritable bowel syndrome with diarrhea: Secondary | ICD-10-CM | POA: Diagnosis not present

## 2018-12-10 DIAGNOSIS — Z86010 Personal history of colonic polyps: Secondary | ICD-10-CM | POA: Diagnosis not present

## 2018-12-31 DIAGNOSIS — N3941 Urge incontinence: Secondary | ICD-10-CM | POA: Diagnosis not present

## 2018-12-31 DIAGNOSIS — R102 Pelvic and perineal pain: Secondary | ICD-10-CM | POA: Diagnosis not present

## 2018-12-31 DIAGNOSIS — M62838 Other muscle spasm: Secondary | ICD-10-CM | POA: Diagnosis not present

## 2018-12-31 DIAGNOSIS — R3915 Urgency of urination: Secondary | ICD-10-CM | POA: Diagnosis not present

## 2018-12-31 DIAGNOSIS — M6281 Muscle weakness (generalized): Secondary | ICD-10-CM | POA: Diagnosis not present

## 2019-03-02 ENCOUNTER — Ambulatory Visit (INDEPENDENT_AMBULATORY_CARE_PROVIDER_SITE_OTHER): Payer: Medicare Other | Admitting: Internal Medicine

## 2019-03-02 ENCOUNTER — Other Ambulatory Visit: Payer: Self-pay

## 2019-03-02 ENCOUNTER — Encounter: Payer: Self-pay | Admitting: Internal Medicine

## 2019-03-02 DIAGNOSIS — I10 Essential (primary) hypertension: Secondary | ICD-10-CM | POA: Diagnosis not present

## 2019-03-02 DIAGNOSIS — E1169 Type 2 diabetes mellitus with other specified complication: Secondary | ICD-10-CM | POA: Diagnosis not present

## 2019-03-02 DIAGNOSIS — E119 Type 2 diabetes mellitus without complications: Secondary | ICD-10-CM | POA: Diagnosis not present

## 2019-03-02 DIAGNOSIS — M6289 Other specified disorders of muscle: Secondary | ICD-10-CM | POA: Diagnosis not present

## 2019-03-02 DIAGNOSIS — E785 Hyperlipidemia, unspecified: Secondary | ICD-10-CM

## 2019-03-02 DIAGNOSIS — Z789 Other specified health status: Secondary | ICD-10-CM | POA: Diagnosis not present

## 2019-03-02 DIAGNOSIS — E039 Hypothyroidism, unspecified: Secondary | ICD-10-CM | POA: Diagnosis not present

## 2019-03-02 DIAGNOSIS — Z79899 Other long term (current) drug therapy: Secondary | ICD-10-CM

## 2019-03-02 MED ORDER — CARVEDILOL 12.5 MG PO TABS: 12.5000 mg | ORAL_TABLET | Freq: Two times a day (BID) | ORAL | 3 refills | Status: DC

## 2019-03-02 MED ORDER — GABAPENTIN 300 MG PO CAPS: 300.0000 mg | ORAL_CAPSULE | Freq: Three times a day (TID) | ORAL | 1 refills | Status: DC

## 2019-03-02 NOTE — Progress Notes (Signed)
Virtual Visit via Video Note  I connected with@ on 03/02/19 at  4:00 PM EDT by a video enabled telemedicine application and verified that I am speaking with the correct person using two identifiers. Location patient: home Location provider:work or home office Persons participating in the virtual visit: patient, provider  [place]th national recommendations  regarding COVID 19 pandemic   video visit is advised over in office visit for this patient.  Discussed the limitations of evaluation and management by telemedicine and  availability of in person appointments. The patient expressed understanding and agreed to proceed.   HPI: Valerie Brady  Presents for Valerie Brady  comes in today for follow up of  multiple medical problems.   Generally doing well.  BP: readings have been 145/79 range pulse in 75 range  On  Carvedilol 6.25 bid  No se   Sleep better on gabapentin taking at night would like refill.   DM about 115- 145 x ocass indiscretions  On metformin 1000 per day  Gu   Pelvic floor dysfunctionFor recurrent uti and ur sx saw urologist and rx intravaginal valium se of cns but helped her gu sx with PT and  No on nothing but cranberry extract    Using yoga exercises also    HLD  crestor even 3 x per week causes severe myalgias and had to stop ( has atorva and prava in past and remote past)  Takes vit d   Colon adenomatous polyps  And alight has helped  ibs gi sx   ROS: See pertinent positives and negatives per HPI. No cv pulm reported   Past Medical History:  Diagnosis Date  . Carotidynia   . Diverticulitis of colon 05/22/2015  . Gestational diabetes   . Hyperlipidemia   . Hypertension   . Hypothyroidism   . Syncope 2011   reaction to benicar    Past Surgical History:  Procedure Laterality Date  . cervical polyp removed    . FIBULA FRACTURE SURGERY     OPEN REDUCTION AND PINNING OF SPIRAL FX LEFT FIBULA  . TONSILLECTOMY    . TUBAL LIGATION      Family History   Problem Relation Age of Onset  . Hypertension Mother   . Dementia Mother   . Heart disease Father   . Dementia Father   . Diabetes Father   . Hypertension Brother     Social History   Tobacco Use  . Smoking status: Never Smoker  . Smokeless tobacco: Never Used  Substance Use Topics  . Alcohol use: Yes    Alcohol/week: 0.0 standard drinks    Comment: occ wine  . Drug use: No      Current Outpatient Medications:  .  gabapentin (NEURONTIN) 300 MG capsule, Take 1 capsule (300 mg total) by mouth 3 (three) times daily. Or as directed, Disp: 90 capsule, Rfl: 1 .  Probiotic Product (ALIGN PO), Take by mouth., Disp: , Rfl:  .  carvedilol (COREG) 12.5 MG tablet, Take 1 tablet (12.5 mg total) by mouth 2 (two) times daily with a meal., Disp: 60 tablet, Rfl: 3 .  cholecalciferol (VITAMIN D) 1000 units tablet, Take 2,000 Units by mouth daily. , Disp: , Rfl:  .  Cyanocobalamin (B-12 PO), Take 1 tablet by mouth daily. , Disp: , Rfl:  .  glucose blood (TRUETEST TEST) test strip, Use to test blood sugar once daily and diagnosis code is R73.03, Disp: 100 each, Rfl: 0 .  LANCETS MICRO THIN 33G  MISC, Use to test blood glucose once daily., Disp: 100 each, Rfl: 3 .  levothyroxine (SYNTHROID, LEVOTHROID) 112 MCG tablet, Take 1 tablet (112 mcg total) by mouth daily., Disp: 90 tablet, Rfl: 3 .  Melatonin 5 MG TABS, Take 5 mg by mouth at bedtime., Disp: , Rfl:  .  metFORMIN (GLUCOPHAGE-XR) 500 MG 24 hr tablet, TAKE 1 TABLET BY MOUTH TWICE DAILY WITH A MEAL., Disp: 180 tablet, Rfl: 3 .  nitrofurantoin, macrocrystal-monohydrate, (MACROBID) 100 MG capsule, Take 1 capsule (100 mg total) by mouth 2 (two) times daily., Disp: 20 capsule, Rfl: 1 .  NONFORMULARY OR COMPOUNDED ITEM, Estriol 0.75mg /ml Cream: Insert 1 vaginally three times weekly, Disp: 36 each, Rfl: 1 .  rosuvastatin (CRESTOR) 5 MG tablet, Take 1 tablet (5 mg total) by mouth daily., Disp: 90 tablet, Rfl: 3  EXAM: BP Readings from Last 3  Encounters:  09/24/18 120/70  05/22/18 133/78  02/25/18 108/64    VITALS per patient if applicable:  GENERAL: alert, oriented, appears well and in no acute distress  HEENT: atraumatic, conjunttiva clear, no obvious abnormalities on inspection of external nose and ears  NECK: normal movements of the head and neck  LUNGS: on inspection no signs of respiratory distress, breathing rate appears normal, no obvious gross SOB, gasping or wheezing  CV: no obvious cyanosis  MS: moves all visible extremities without noticeable abnormality  PSYCH/NEURO: pleasant and cooperative, no obvious depression or anxiety, speech and thought processing grossly intact Lab Results  Component Value Date   WBC 15.8 (H) 12/15/2017   HGB 13.3 12/15/2017   HCT 39.2 12/15/2017   PLT 358 12/15/2017   GLUCOSE 136 (H) 12/15/2017   CHOL 312 (H) 05/01/2017   TRIG 266.0 (H) 05/01/2017   HDL 50.30 05/01/2017   LDLDIRECT 188.0 05/01/2017   LDLCALC 174 (H) 02/27/2016   ALT 14 12/15/2017   AST 18 12/15/2017   NA 135 12/15/2017   K 4.0 12/15/2017   CL 104 12/15/2017   CREATININE 0.75 12/15/2017   BUN 16 12/15/2017   CO2 21 (L) 12/15/2017   TSH 1.10 05/22/2018   HGBA1C 6.6 (H) 05/22/2018   MICROALBUR 1.4 05/01/2017    ASSESSMENT AND PLAN:  Discussed the following assessment and plan:  Type 2 diabetes mellitus without complication, without long-term current use of insulin ([place]) - Plan: Basic metabolic panel, CBC with Differential/Platelet, Hemoglobin A1c, Hepatic function panel, Lipid panel, TSH, Microalbumin / creatinine urine ratio  Medication management - Plan: Basic metabolic panel, CBC with Differential/Platelet, Hemoglobin A1c, Hepatic function panel, Lipid panel, TSH, Microalbumin / creatinine urine ratio  Essential hypertension - intensification of  med inc dose to 12.5 bid  - Plan: Basic metabolic panel, CBC with Differential/Platelet, Hemoglobin A1c, Hepatic function panel, Lipid panel, TSH,  Microalbumin / creatinine urine ratio  Hypothyroidism, unspecified type - Plan: Basic metabolic panel, CBC with Differential/Platelet, Hemoglobin A1c, Hepatic function panel, Lipid panel, TSH, Microalbumin / creatinine urine ratio  Statin intolerance - plan labs and then consider other options  - Plan: Basic metabolic panel, CBC with Differential/Platelet, Hemoglobin A1c, Hepatic function panel, Lipid panel, TSH, Microalbumin / creatinine urine ratio  Hyperlipidemia associated with type 2 diabetes mellitus ([place]) - Plan: Basic metabolic panel, CBC with Differential/Platelet, Hemoglobin A1c, Hepatic function panel, Lipid panel, TSH, Microalbumin / creatinine urine ratio  Pelvic floor dysfunction in female - responding to  exercises and ocass vaginal valium ? Not tried pitavastatin  Consider  In future trial  Get labs first  Then plan fu  exam etc  Counseled.   Expectant management and discussion of plan and treatment with patient with opportunity to ask questions and all were answered. The patient agreed with the plan and demonstrated an understanding of the instructions.  future labs ordered     The patient was advised to call back or seek an in-person evaluation if worsening  or having concerns . In interim  .  Shanon Ace, MD

## 2019-04-14 ENCOUNTER — Telehealth: Payer: Self-pay | Admitting: Internal Medicine

## 2019-04-14 NOTE — Telephone Encounter (Signed)
Copied from [place] [id number]. Topic: Quick Communication - Rx Refill/Question >> Apr 14, 2019  4:42 PM Loma [place] wrote: CRM for notification. See Telephone encounter for: 04/14/19.NONFORMULARY OR COMPOUNDED [place], [place] [phone] (Phone) [phone] (Fax) please refill patient got message from pharmacy stating this was not appropriate.

## 2019-04-15 ENCOUNTER — Other Ambulatory Visit: Payer: Self-pay

## 2019-04-15 MED ORDER — NONFORMULARY OR COMPOUNDED ITEM: 1 refills | Status: DC

## 2019-04-15 NOTE — Telephone Encounter (Signed)
Refill was sent in 

## 2019-06-12 ENCOUNTER — Other Ambulatory Visit: Payer: Self-pay

## 2019-07-06 ENCOUNTER — Other Ambulatory Visit: Payer: Self-pay | Admitting: Internal Medicine

## 2019-07-08 DIAGNOSIS — H43811 Vitreous degeneration, right eye: Secondary | ICD-10-CM | POA: Diagnosis not present

## 2019-07-08 DIAGNOSIS — E119 Type 2 diabetes mellitus without complications: Secondary | ICD-10-CM | POA: Diagnosis not present

## 2019-07-08 DIAGNOSIS — H2513 Age-related nuclear cataract, bilateral: Secondary | ICD-10-CM | POA: Diagnosis not present

## 2019-07-08 DIAGNOSIS — H40013 Open angle with borderline findings, low risk, bilateral: Secondary | ICD-10-CM | POA: Diagnosis not present

## 2019-07-08 LAB — HM DIABETES EYE EXAM

## 2019-07-15 ENCOUNTER — Telehealth: Payer: Self-pay | Admitting: Internal Medicine

## 2019-07-15 NOTE — Telephone Encounter (Signed)
Heather from [facility] called and inquired about the form faxed to the office on 8.24.20 and 8.27.20 and will be faxing it again today. Provide needs to sign ME form and return fax to [phone]. this is in reference to Lancets and test strips / please advise

## 2019-07-15 NOTE — Telephone Encounter (Signed)
This has already been faxed back

## 2019-08-04 ENCOUNTER — Encounter: Payer: Self-pay | Admitting: Internal Medicine

## 2019-08-08 ENCOUNTER — Other Ambulatory Visit: Payer: Self-pay | Admitting: Internal Medicine

## 2019-08-30 DIAGNOSIS — R300 Dysuria: Secondary | ICD-10-CM | POA: Diagnosis not present

## 2019-08-30 DIAGNOSIS — N3001 Acute cystitis with hematuria: Secondary | ICD-10-CM | POA: Diagnosis not present

## 2019-08-30 DIAGNOSIS — Z23 Encounter for immunization: Secondary | ICD-10-CM | POA: Diagnosis not present

## 2019-09-01 ENCOUNTER — Other Ambulatory Visit: Payer: Self-pay | Admitting: Internal Medicine

## 2019-09-09 ENCOUNTER — Other Ambulatory Visit: Payer: Self-pay | Admitting: Internal Medicine

## 2019-09-17 DIAGNOSIS — R300 Dysuria: Secondary | ICD-10-CM | POA: Diagnosis not present

## 2019-09-17 DIAGNOSIS — R350 Frequency of micturition: Secondary | ICD-10-CM | POA: Diagnosis not present

## 2019-09-17 DIAGNOSIS — N390 Urinary tract infection, site not specified: Secondary | ICD-10-CM | POA: Diagnosis not present

## 2019-09-28 ENCOUNTER — Other Ambulatory Visit: Payer: Self-pay

## 2019-09-28 MED ORDER — NONFORMULARY OR COMPOUNDED ITEM: 1 refills | Status: DC

## 2019-10-01 ENCOUNTER — Other Ambulatory Visit: Payer: Self-pay | Admitting: Internal Medicine

## 2019-10-05 ENCOUNTER — Other Ambulatory Visit: Payer: Self-pay

## 2019-10-10 ENCOUNTER — Other Ambulatory Visit: Payer: Self-pay | Admitting: Internal Medicine

## 2019-10-13 ENCOUNTER — Other Ambulatory Visit: Payer: Self-pay

## 2019-10-14 ENCOUNTER — Other Ambulatory Visit (INDEPENDENT_AMBULATORY_CARE_PROVIDER_SITE_OTHER): Payer: Medicare Other

## 2019-10-14 DIAGNOSIS — Z79899 Other long term (current) drug therapy: Secondary | ICD-10-CM

## 2019-10-14 DIAGNOSIS — E1169 Type 2 diabetes mellitus with other specified complication: Secondary | ICD-10-CM

## 2019-10-14 DIAGNOSIS — E785 Hyperlipidemia, unspecified: Secondary | ICD-10-CM

## 2019-10-14 DIAGNOSIS — I10 Essential (primary) hypertension: Secondary | ICD-10-CM

## 2019-10-14 DIAGNOSIS — Z789 Other specified health status: Secondary | ICD-10-CM | POA: Diagnosis not present

## 2019-10-14 DIAGNOSIS — E119 Type 2 diabetes mellitus without complications: Secondary | ICD-10-CM | POA: Diagnosis not present

## 2019-10-14 DIAGNOSIS — E039 Hypothyroidism, unspecified: Secondary | ICD-10-CM | POA: Diagnosis not present

## 2019-10-14 LAB — LIPID PANEL
Cholesterol: 273 mg/dL — ABNORMAL HIGH (ref 0–200)
HDL: 42.6 mg/dL (ref >39.00)
NonHDL: 230.69
Total CHOL/HDL Ratio: 6
Triglycerides: 244 mg/dL — ABNORMAL HIGH (ref 0.0–149.0)
VLDL: 48.8 mg/dL — ABNORMAL HIGH (ref 0.0–40.0)

## 2019-10-14 LAB — CBC WITH DIFFERENTIAL/PLATELET
Basophils Absolute: 0 10*3/uL (ref 0.0–0.1)
Basophils Relative: 0.3 % (ref 0.0–3.0)
Eosinophils Absolute: 0.2 10*3/uL (ref 0.0–0.7)
Eosinophils Relative: 2.3 % (ref 0.0–5.0)
HCT: 39.3 % (ref 36.0–46.0)
Hemoglobin: 13.2 g/dL (ref 12.0–15.0)
Lymphocytes Relative: 49.3 % — ABNORMAL HIGH (ref 12.0–46.0)
Lymphs Abs: 4.3 10*3/uL — ABNORMAL HIGH (ref 0.7–4.0)
MCHC: 33.4 g/dL (ref 30.0–36.0)
MCV: 89 fl (ref 78.0–100.0)
Monocytes Absolute: 0.6 10*3/uL (ref 0.1–1.0)
Monocytes Relative: 6.6 % (ref 3.0–12.0)
Neutro Abs: 3.6 10*3/uL (ref 1.4–7.7)
Neutrophils Relative %: 41.5 % — ABNORMAL LOW (ref 43.0–77.0)
Platelets: 354 10*3/uL (ref 150.0–400.0)
RBC: 4.42 Mil/uL (ref 3.87–5.11)
RDW: 14.2 % (ref 11.5–15.5)
WBC: 8.7 10*3/uL (ref 4.0–10.5)

## 2019-10-14 LAB — BASIC METABOLIC PANEL
BUN: 14 mg/dL (ref 6–23)
CO2: 28 mEq/L (ref 19–32)
Calcium: 9.3 mg/dL (ref 8.4–10.5)
Chloride: 103 mEq/L (ref 96–112)
Creatinine, Ser: 0.76 mg/dL (ref 0.40–1.20)
GFR: 74.82 mL/min (ref >60.00)
Glucose, Bld: 112 mg/dL — ABNORMAL HIGH (ref 70–99)
Potassium: 4.1 mEq/L (ref 3.5–5.1)
Sodium: 139 mEq/L (ref 135–145)

## 2019-10-14 LAB — HEPATIC FUNCTION PANEL
ALT: 10 U/L (ref 0–35)
AST: 12 U/L (ref 0–37)
Albumin: 4.2 g/dL (ref 3.5–5.2)
Alkaline Phosphatase: 36 U/L — ABNORMAL LOW (ref 39–117)
Bilirubin, Direct: 0.1 mg/dL (ref 0.0–0.3)
Total Bilirubin: 0.4 mg/dL (ref 0.2–1.2)
Total Protein: 6.6 g/dL (ref 6.0–8.3)

## 2019-10-14 LAB — MICROALBUMIN / CREATININE URINE RATIO
Creatinine,U: 244.9 mg/dL
Microalb Creat Ratio: 0.6 mg/g (ref 0.0–30.0)
Microalb, Ur: 1.5 mg/dL (ref 0.0–1.9)

## 2019-10-14 LAB — LDL CHOLESTEROL, DIRECT: Direct LDL: 179 mg/dL

## 2019-10-14 LAB — TSH: TSH: 1.91 u[IU]/mL (ref 0.35–4.50)

## 2019-10-14 LAB — HEMOGLOBIN A1C: Hgb A1c MFr Bld: 6.5 % (ref 4.6–6.5)

## 2019-10-19 NOTE — Progress Notes (Signed)
This visit occurred during the SARS-CoV-2 public health emergency.  Safety protocols were in place, including screening questions prior to the visit, additional usage of staff PPE, and extensive cleaning of exam room while observing appropriate contact time as indicated for disinfecting solutions.    Chief Complaint  Patient presents with  . Follow-up    to go over lab results     HPI: Valerie Brady 73 y.o. come in for Chronic disease management  Check  DM  Sugar   120 - 140   At most  And no lows.   HLD  Med issue.  Has been unable to tolerate pravastatin Crestor atorvastatin even at low dose.  BP  About 120 130 range.   ibs    Trying  cranberry  For urge incontinence  .     Vaginal Valium has helped intermittently for urinary incontinence spasm.  But has side effects so titrates.  No eye or neurologic symptoms related to hypoglycemia.  Thyroid no change. ROS: See pertinent positives and negatives per HPI.  Denies cardiovascular pulmonary symptoms. Social husband has some form of dementia still able to continue his routine functions at this time. Past Medical History:  Diagnosis Date  . Carotidynia   . Diverticulitis of colon 05/22/2015  . Gestational diabetes   . Hyperlipidemia   . Hypertension   . Hypothyroidism   . Syncope 2011   reaction to benicar    Family History  Problem Relation Age of Onset  . Hypertension Mother   . Dementia Mother   . Heart disease Father   . Dementia Father   . Diabetes Father   . Hypertension Brother     Social History   Socioeconomic History  . Marital status: Married    Spouse name: Not on file  . Number of children: Not on file  . Years of education: Not on file  . Highest education level: Not on file  Occupational History  . Occupation: retired Engineer, drilling    Comment: internist  Social Needs  . Financial resource strain: Not on file  . Food insecurity    Worry: Not on file    Inability: Not on file  .  Transportation needs    Medical: Not on file    Non-medical: Not on file  Tobacco Use  . Smoking status: Never Smoker  . Smokeless tobacco: Never Used  Substance and Sexual Activity  . Alcohol use: Yes    Alcohol/week: 0.0 standard drinks    Comment: occ wine  . Drug use: No  . Sexual activity: Never    Birth control/protection: Abstinence  Lifestyle  . Physical activity    Days per week: Not on file    Minutes per session: Not on file  . Stress: Not on file  Relationships  . Social Herbalist on phone: Not on file    Gets together: Not on file    Attends religious service: Not on file    Active member of club or organization: Not on file    Attends meetings of clubs or organizations: Not on file    Relationship status: Not on file  Other Topics Concern  . Not on file  Social History Narrative   Did not rnew lisence 2017   Neg tobacco    Married   HH of 5   Pet cat   Mom with dementia       EXAM:  BP 120/64 (BP Location: Right Arm, Patient Position: Sitting,  Cuff Size: Normal)   Pulse 76   Temp 97.9 F (36.6 C) (Temporal)   Ht 5\' 5"  (1.651 m)   Wt 165 lb 9.6 oz (75.1 kg)   SpO2 97%   BMI 27.56 kg/m   Body mass index is 27.56 kg/m.  GENERAL: vitals reviewed and listed above, alert, oriented, appears well hydrated and in no acute distress HEENT: atraumatic, conjunctiva  clear, no obvious abnormalities on inspection of external nose and ears OP masked NECK: no obvious masses on inspection palpation  LUNGS: clear to auscultation bilaterally, no wheezes, rales or rhonchi, good air movement CV: HRRR, no clubbing cyanosis or  peripheral edema nl cap refill  Abdomen:  Sof,t normal bowel sounds without hepatosplenomegaly, no guarding rebound or masses no CVA tenderness MS: moves all extremities without noticeable focal  abnormality PSYCH: pleasant and cooperative, no obvious depression or anxiety Diabetic Foot Exam - Simple   Simple Foot Form Diabetic  Foot exam was performed with the following findings: Yes 10/20/2019 10:47 AM  Visual Inspection No deformities, no ulcerations, no other skin breakdown bilaterally: Yes Sensation Testing Intact to touch and monofilament testing bilaterally: Yes Pulse Check Posterior Tibialis and Dorsalis pulse intact bilaterally: Yes Comments     Lab Results  Component Value Date   WBC 8.7 10/14/2019   HGB 13.2 10/14/2019   HCT 39.3 10/14/2019   PLT 354.0 10/14/2019   GLUCOSE 112 (H) 10/14/2019   CHOL 273 (H) 10/14/2019   TRIG 244.0 (H) 10/14/2019   HDL 42.60 10/14/2019   LDLDIRECT 179.0 10/14/2019   LDLCALC 174 (H) 02/27/2016   ALT 10 10/14/2019   AST 12 10/14/2019   NA 139 10/14/2019   K 4.1 10/14/2019   CL 103 10/14/2019   CREATININE 0.76 10/14/2019   BUN 14 10/14/2019   CO2 28 10/14/2019   TSH 1.91 10/14/2019   HGBA1C 6.5 10/14/2019   MICROALBUR 1.5 10/14/2019   BP Readings from Last 3 Encounters:  10/20/19 120/64  09/24/18 120/70  05/22/18 133/78   Lab reviewed  ASSESSMENT AND PLAN:  Discussed the following assessment and plan:  Type 2 diabetes mellitus without complication, without long-term current use of insulin (HCC) - Plan: Hemoglobin A1c, Lipid panel  Hyperlipidemia associated with type 2 diabetes mellitus ([place]) - Plan: Hemoglobin A1c, Lipid panel Declined dexa  To get Mammo Will try another lipid med   Low dose 3 d per week?  Cont vit d  Disc   New covid vaccine coming  .       plan 4-6 mos a1c and lipid panel  -Patient advised to return or notify health care team  if  new concerns arise.  Patient Instructions  Try new medication for  Lipids.  shingrix when want.      Health Maintenance, Female Adopting a healthy lifestyle and getting preventive care are important in promoting health and wellness. Ask your health care provider about:  The right schedule for you to have regular tests and exams.  Things you can do on your own to prevent diseases and keep  yourself healthy. What should I know about diet, weight, and exercise? Eat a healthy diet   Eat a diet that includes plenty of vegetables, fruits, low-fat dairy products, and lean protein.  Do not eat a lot of foods that are high in solid fats, added sugars, or sodium. Maintain a healthy weight Body mass index (BMI) is used to identify weight problems. It estimates body fat based on height and weight. Your health  care provider can help determine your BMI and help you achieve or maintain a healthy weight. Get regular exercise Get regular exercise. This is one of the most important things you can do for your health. Most adults should:  Exercise for at least 150 minutes each week. The exercise should increase your heart rate and make you sweat (moderate-intensity exercise).  Do strengthening exercises at least twice a week. This is in addition to the moderate-intensity exercise.  Spend less time sitting. Even light physical activity can be beneficial. Watch cholesterol and blood lipids Have your blood tested for lipids and cholesterol at 72 years of age, then have this test every 5 years. Have your cholesterol levels checked more often if:  Your lipid or cholesterol levels are high.  You are older than 72 years of age.  You are at high risk for heart disease. What should I know about cancer screening? Depending on your health history and family history, you may need to have cancer screening at various ages. This may include screening for:  Breast cancer.  Cervical cancer.  Colorectal cancer.  Skin cancer.  Lung cancer. What should I know about heart disease, diabetes, and high blood pressure? Blood pressure and heart disease  High blood pressure causes heart disease and increases the risk of stroke. This is more likely to develop in people who have high blood pressure readings, are of African descent, or are overweight.  Have your blood pressure checked: ? Every 3-5 years  if you are 15-85 years of age. ? Every year if you are 33 years old or older. Diabetes Have regular diabetes screenings. This checks your fasting blood sugar level. Have the screening done:  Once every three years after age 67 if you are at a normal weight and have a low risk for diabetes.  More often and at a younger age if you are overweight or have a high risk for diabetes. What should I know about preventing infection? Hepatitis B If you have a higher risk for hepatitis B, you should be screened for this virus. Talk with your health care provider to find out if you are at risk for hepatitis B infection. Hepatitis C Testing is recommended for:  Everyone born from [birth year] through 1965.  Anyone with known risk factors for hepatitis C. Sexually transmitted infections (STIs)  Get screened for STIs, including gonorrhea and chlamydia, if: ? You are sexually active and are younger than 72 years of age. ? You are older than 72 years of age and your health care provider tells you that you are at risk for this type of infection. ? Your sexual activity has changed since you were last screened, and you are at increased risk for chlamydia or gonorrhea. Ask your health care provider if you are at risk.  Ask your health care provider about whether you are at high risk for HIV. Your health care provider may recommend a prescription medicine to help prevent HIV infection. If you choose to take medicine to prevent HIV, you should first get tested for HIV. You should then be tested every 3 months for as long as you are taking the medicine. Pregnancy  If you are about to stop having your period (premenopausal) and you may become pregnant, seek counseling before you get pregnant.  Take 400 to 800 micrograms (mcg) of folic acid every day if you become pregnant.  Ask for birth control (contraception) if you want to prevent pregnancy. Osteoporosis and menopause Osteoporosis is a disease  in which the bones  lose minerals and strength with aging. This can result in bone fractures. If you are 20 years old or older, or if you are at risk for osteoporosis and fractures, ask your health care provider if you should:  Be screened for bone loss.  Take a calcium or vitamin D supplement to lower your risk of fractures.  Be given hormone replacement therapy (HRT) to treat symptoms of menopause. Follow these instructions at home: Lifestyle  Do not use any products that contain nicotine or tobacco, such as cigarettes, e-cigarettes, and chewing tobacco. If you need help quitting, ask your health care provider.  Do not use street drugs.  Do not share needles.  Ask your health care provider for help if you need support or information about quitting drugs. Alcohol use  Do not drink alcohol if: ? Your health care provider tells you not to drink. ? You are pregnant, may be pregnant, or are planning to become pregnant.  If you drink alcohol: ? Limit how much you use to 0-1 drink a day. ? Limit intake if you are breastfeeding.  Be aware of how much alcohol is in your drink. In the [place] one drink equals one 12 oz bottle of beer (355 mL), one 5 oz glass of wine (148 mL), or one 1 oz glass of hard liquor (44 mL). General instructions  Schedule regular health, dental, and eye exams.  Stay current with your vaccines.  Tell your health care provider if: ? You often feel depressed. ? You have ever been abused or do not feel safe at home. Summary  Adopting a healthy lifestyle and getting preventive care are important in promoting health and wellness.  Follow your health care provider's instructions about healthy diet, exercising, and getting tested or screened for diseases.  Follow your health care provider's instructions on monitoring your cholesterol and blood pressure. This information is not intended to replace advice given to you by your health care provider. Make sure you discuss any questions  you have with your health care provider. Document Released: 05/14/2011 Document Revised: 10/22/2018 Document Reviewed: 10/22/2018 Elsevier Patient Education  2020 Nezperce Panosh M.D.

## 2019-10-20 ENCOUNTER — Other Ambulatory Visit: Payer: Self-pay

## 2019-10-20 ENCOUNTER — Encounter: Payer: Self-pay | Admitting: Internal Medicine

## 2019-10-20 ENCOUNTER — Ambulatory Visit (INDEPENDENT_AMBULATORY_CARE_PROVIDER_SITE_OTHER): Payer: Medicare Other | Admitting: Internal Medicine

## 2019-10-20 VITALS — BP 120/64 | HR 76 | Temp 97.9°F | Ht 65.0 in | Wt 165.6 lb

## 2019-10-20 DIAGNOSIS — E785 Hyperlipidemia, unspecified: Secondary | ICD-10-CM

## 2019-10-20 DIAGNOSIS — E119 Type 2 diabetes mellitus without complications: Secondary | ICD-10-CM | POA: Diagnosis not present

## 2019-10-20 DIAGNOSIS — E1169 Type 2 diabetes mellitus with other specified complication: Secondary | ICD-10-CM

## 2019-10-20 MED ORDER — PITAVASTATIN CALCIUM 1 MG PO TABS: 1.0000 | ORAL_TABLET | Freq: Every day | ORAL | 1 refills | Status: DC

## 2019-10-20 NOTE — Patient Instructions (Signed)
Try new medication for  Lipids.  shingrix when want.      Health Maintenance, Female Adopting a healthy lifestyle and getting preventive care are important in promoting health and wellness. Ask your health care provider about:  The right schedule for you to have regular tests and exams.  Things you can do on your own to prevent diseases and keep yourself healthy. What should I know about diet, weight, and exercise? Eat a healthy diet   Eat a diet that includes plenty of vegetables, fruits, low-fat dairy products, and lean protein.  Do not eat a lot of foods that are high in solid fats, added sugars, or sodium. Maintain a healthy weight Body mass index (BMI) is used to identify weight problems. It estimates body fat based on height and weight. Your health care provider can help determine your BMI and help you achieve or maintain a healthy weight. Get regular exercise Get regular exercise. This is one of the most important things you can do for your health. Most adults should:  Exercise for at least 150 minutes each week. The exercise should increase your heart rate and make you sweat (moderate-intensity exercise).  Do strengthening exercises at least twice a week. This is in addition to the moderate-intensity exercise.  Spend less time sitting. Even light physical activity can be beneficial. Watch cholesterol and blood lipids Have your blood tested for lipids and cholesterol at 72 years of age, then have this test every 5 years. Have your cholesterol levels checked more often if:  Your lipid or cholesterol levels are high.  You are older than 72 years of age.  You are at high risk for heart disease. What should I know about cancer screening? Depending on your health history and family history, you may need to have cancer screening at various ages. This may include screening for:  Breast cancer.  Cervical cancer.  Colorectal cancer.  Skin cancer.  Lung cancer. What  should I know about heart disease, diabetes, and high blood pressure? Blood pressure and heart disease  High blood pressure causes heart disease and increases the risk of stroke. This is more likely to develop in people who have high blood pressure readings, are of African descent, or are overweight.  Have your blood pressure checked: ? Every 3-5 years if you are 72-72 years of age. ? Every year if you are 72 years old or older. Diabetes Have regular diabetes screenings. This checks your fasting blood sugar level. Have the screening done:  Once every three years after age 33 if you are at a normal weight and have a low risk for diabetes.  More often and at a younger age if you are overweight or have a high risk for diabetes. What should I know about preventing infection? Hepatitis B If you have a higher risk for hepatitis B, you should be screened for this virus. Talk with your health care provider to find out if you are at risk for hepatitis B infection. Hepatitis C Testing is recommended for:  Everyone born from [birth year] through 1964.  Anyone with known risk factors for hepatitis C. Sexually transmitted infections (STIs)  Get screened for STIs, including gonorrhea and chlamydia, if: ? You are sexually active and are younger than 72 years of age. ? You are older than 72 years of age and your health care provider tells you that you are at risk for this type of infection. ? Your sexual activity has changed since you were last screened, and  you are at increased risk for chlamydia or gonorrhea. Ask your health care provider if you are at risk.  Ask your health care provider about whether you are at high risk for HIV. Your health care provider may recommend a prescription medicine to help prevent HIV infection. If you choose to take medicine to prevent HIV, you should first get tested for HIV. You should then be tested every 3 months for as long as you are taking the medicine. Pregnancy  If  you are about to stop having your period (premenopausal) and you may become pregnant, seek counseling before you get pregnant.  Take 400 to 800 micrograms (mcg) of folic acid every day if you become pregnant.  Ask for birth control (contraception) if you want to prevent pregnancy. Osteoporosis and menopause Osteoporosis is a disease in which the bones lose minerals and strength with aging. This can result in bone fractures. If you are 72 years old or older, or if you are at risk for osteoporosis and fractures, ask your health care provider if you should:  Be screened for bone loss.  Take a calcium or vitamin D supplement to lower your risk of fractures.  Be given hormone replacement therapy (HRT) to treat symptoms of menopause. Follow these instructions at home: Lifestyle  Do not use any products that contain nicotine or tobacco, such as cigarettes, e-cigarettes, and chewing tobacco. If you need help quitting, ask your health care provider.  Do not use street drugs.  Do not share needles.  Ask your health care provider for help if you need support or information about quitting drugs. Alcohol use  Do not drink alcohol if: ? Your health care provider tells you not to drink. ? You are pregnant, may be pregnant, or are planning to become pregnant.  If you drink alcohol: ? Limit how much you use to 0-1 drink a day. ? Limit intake if you are breastfeeding.  Be aware of how much alcohol is in your drink. In the [place] one drink equals one 12 oz bottle of beer (355 mL), one 5 oz glass of wine (148 mL), or one 1 oz glass of hard liquor (44 mL). General instructions  Schedule regular health, dental, and eye exams.  Stay current with your vaccines.  Tell your health care provider if: ? You often feel depressed. ? You have ever been abused or do not feel safe at home. Summary  Adopting a healthy lifestyle and getting preventive care are important in promoting health and wellness.   Follow your health care provider's instructions about healthy diet, exercising, and getting tested or screened for diseases.  Follow your health care provider's instructions on monitoring your cholesterol and blood pressure. This information is not intended to replace advice given to you by your health care provider. Make sure you discuss any questions you have with your health care provider. Document Released: 05/14/2011 Document Revised: 10/22/2018 Document Reviewed: 10/22/2018 Elsevier Patient Education  2020 [employer].

## 2019-11-12 DIAGNOSIS — Z1231 Encounter for screening mammogram for malignant neoplasm of breast: Secondary | ICD-10-CM | POA: Diagnosis not present

## 2019-11-12 LAB — HM MAMMOGRAPHY

## 2019-11-16 ENCOUNTER — Other Ambulatory Visit: Payer: Self-pay | Admitting: Internal Medicine

## 2019-11-18 ENCOUNTER — Other Ambulatory Visit: Payer: Self-pay | Admitting: Internal Medicine

## 2019-11-19 ENCOUNTER — Encounter: Payer: Self-pay | Admitting: Internal Medicine

## 2019-11-23 DIAGNOSIS — R300 Dysuria: Secondary | ICD-10-CM | POA: Diagnosis not present

## 2019-11-26 ENCOUNTER — Other Ambulatory Visit: Payer: Self-pay | Admitting: Internal Medicine

## 2019-11-26 NOTE — Telephone Encounter (Signed)
Medication Refill - Medication: metformin  Has the patient contacted their pharmacy? Yes.   Pharmacy states the pt only has a day left and that they have been trying to get this medication refilled since 11/19/19. Please advise.  (Agent: If no, request that the patient contact the pharmacy for the refill.) (Agent: If yes, when and what did the pharmacy advise?)  Preferred Pharmacy (with phone number or street name):  [facility], [place] [address]  Phone: [phone] Fax: [phone]  Not a 24 hour pharmacy; exact hours not known.     Agent: Please be advised that RX refills may take up to 3 business days. We ask that you follow-up with your pharmacy.

## 2019-11-26 NOTE — Telephone Encounter (Signed)
Please rx refill request  

## 2019-11-26 NOTE — Telephone Encounter (Signed)
Requested medication (s) are due for refill today: Yes  Requested medication (s) are on the active medication list: Yes  Last refill:  09/24/18  Future visit scheduled: No  Notes to clinic: Prescription has expired.    Requested Prescriptions  Pending Prescriptions Disp Refills   metFORMIN (GLUCOPHAGE-XR) 500 MG 24 hr tablet 180 tablet 3    Sig: TAKE 1 TABLET BY MOUTH TWICE DAILY WITH A MEAL.      Endocrinology:  Diabetes - Biguanides Passed - 11/26/2019  3:20 PM      Passed - Cr in normal range and within 360 days    Creatinine, Ser  Date Value Ref Range Status  10/14/2019 0.76 0.40 - 1.20 mg/dL Final   Creatinine,U  Date Value Ref Range Status  10/14/2019 244.9 mg/dL Final          Passed - HBA1C is between 0 and 7.9 and within 180 days    Hgb A1c MFr Bld  Date Value Ref Range Status  10/14/2019 6.5 4.6 - 6.5 % Final    Comment:    Glycemic Control Guidelines for People with Diabetes:Non Diabetic:  <6%Goal of Therapy: <7%Additional Action Suggested:  >8%           Passed - eGFR in normal range and within 360 days    GFR calc Af Amer  Date Value Ref Range Status  12/15/2017 >60 >60 mL/min Final    Comment:    (NOTE) The eGFR has been calculated using the CKD EPI equation. This calculation has not been validated in all clinical situations. eGFR's persistently <60 mL/min signify possible Chronic Kidney Disease.    GFR calc non Af Amer  Date Value Ref Range Status  12/15/2017 >60 >60 mL/min Final   GFR  Date Value Ref Range Status  10/14/2019 74.82 >60.00 mL/min Final          Passed - Valid encounter within last 6 months    Recent Outpatient Visits           1 month ago Type 2 diabetes mellitus without complication, without long-term current use of insulin (HCC)   [facility] Panosh, Wanda K, MD   8 months ago Type 2 diabetes mellitus without complication, without long-term current use of insulin (HCC)   [facility] Panosh, Wanda K, MD   1 year ago Dysuria   [facility] Todd, Jeffrey A, MD   1 year ago Essential hypertension   [facility] Panosh, Wanda K, MD   1 year ago Urinary tract infection with hematuria, site unspecified   [facility] Fry, Stephen A, MD                

## 2019-11-27 MED ORDER — METFORMIN HCL ER 500 MG PO TB24: ORAL_TABLET | ORAL | 3 refills | Status: DC

## 2019-12-08 ENCOUNTER — Other Ambulatory Visit: Payer: Self-pay | Admitting: Internal Medicine

## 2019-12-17 ENCOUNTER — Ambulatory Visit: Payer: Medicare Other | Attending: Internal Medicine

## 2019-12-17 DIAGNOSIS — Z23 Encounter for immunization: Secondary | ICD-10-CM | POA: Insufficient documentation

## 2019-12-17 NOTE — Progress Notes (Signed)
   Covid-19 Vaccination Clinic  Name:  Valerie Brady    MRN: [id number] DOB: May 19, 1946  12/17/2019  [name] was observed post Covid-19 immunization for 15 minutes without incidence. She was provided with Vaccine Information Sheet and instruction to access the V-Safe system.   [name] was instructed to call 911 with any severe reactions post vaccine: Marland Kitchen Difficulty breathing  . Swelling of your face and throat  . A fast heartbeat  . A bad rash all over your body  . Dizziness and weakness    Immunizations Administered    Name Date Dose VIS Date Route   Pfizer COVID-19 Vaccine 12/17/2019  6:37 PM 0.3 mL 10/23/2019 Intramuscular   Manufacturer: [place]   Lot: [id number]   [place]: [id number]

## 2020-01-02 ENCOUNTER — Other Ambulatory Visit: Payer: Self-pay | Admitting: Internal Medicine

## 2020-01-12 ENCOUNTER — Ambulatory Visit: Payer: Medicare Other | Attending: Internal Medicine

## 2020-01-12 DIAGNOSIS — Z23 Encounter for immunization: Secondary | ICD-10-CM | POA: Insufficient documentation

## 2020-01-12 NOTE — Progress Notes (Signed)
   Covid-19 Vaccination Clinic  Name:  Valerie Brady    MRN: [id number] DOB: 08-31-1946  01/12/2020  Ms. Gradel was observed post Covid-19 immunization for 15 minutes without incident. She was provided with Vaccine Information Sheet and instruction to access the V-Safe system.   Ms. Yusupov was instructed to call 911 with any severe reactions post vaccine: Marland Kitchen Difficulty breathing  . Swelling of face and throat  . A fast heartbeat  . A bad rash all over body  . Dizziness and weakness   Immunizations Administered    Name Date Dose VIS Date Route   Pfizer COVID-19 Vaccine 01/12/2020 11:03 AM 0.3 mL 10/23/2019 Intramuscular   Manufacturer: [place]   Lot: [id number]   [place]: [id number]

## 2020-02-20 ENCOUNTER — Other Ambulatory Visit: Payer: Self-pay | Admitting: Internal Medicine

## 2020-02-22 ENCOUNTER — Other Ambulatory Visit: Payer: Self-pay

## 2020-02-22 MED ORDER — CARVEDILOL 12.5 MG PO TABS: 12.5000 mg | ORAL_TABLET | Freq: Two times a day (BID) | ORAL | 0 refills | Status: DC

## 2020-03-04 ENCOUNTER — Other Ambulatory Visit: Payer: Self-pay

## 2020-03-04 MED ORDER — NONFORMULARY OR COMPOUNDED ITEM: 1 refills | Status: DC

## 2020-03-08 ENCOUNTER — Other Ambulatory Visit: Payer: Self-pay

## 2020-03-08 MED ORDER — NONFORMULARY OR COMPOUNDED ITEM: 1 refills | Status: DC

## 2020-03-12 ENCOUNTER — Other Ambulatory Visit: Payer: Self-pay | Admitting: Internal Medicine

## 2020-03-15 ENCOUNTER — Telehealth: Payer: Self-pay

## 2020-03-15 NOTE — Telephone Encounter (Signed)
[place] and spoke to [facility] and let her know that I have been trying to send the Estriol electronically to them and it will not go through. I called to confirm refills of this prescription. Brooke verbalized an understanding.

## 2020-04-18 ENCOUNTER — Other Ambulatory Visit: Payer: Self-pay | Admitting: Internal Medicine

## 2020-04-20 ENCOUNTER — Encounter (INDEPENDENT_AMBULATORY_CARE_PROVIDER_SITE_OTHER): Payer: Self-pay | Admitting: Otolaryngology

## 2020-04-20 ENCOUNTER — Ambulatory Visit (INDEPENDENT_AMBULATORY_CARE_PROVIDER_SITE_OTHER): Payer: Medicare Other | Admitting: Otolaryngology

## 2020-04-20 ENCOUNTER — Other Ambulatory Visit: Payer: Self-pay

## 2020-04-20 VITALS — Temp 97.2°F

## 2020-04-20 DIAGNOSIS — H6123 Impacted cerumen, bilateral: Secondary | ICD-10-CM

## 2020-04-20 NOTE — Progress Notes (Signed)
HPI: Valerie Brady is a 74 y.o. female who presents for evaluation of wax buildup in both ears.  She presents with her husband..  Past Medical History:  Diagnosis Date  . Carotidynia   . Diverticulitis of colon 05/22/2015  . Gestational diabetes   . Hyperlipidemia   . Hypertension   . Hypothyroidism   . Syncope 2011   reaction to benicar   Past Surgical History:  Procedure Laterality Date  . cervical polyp removed    . FIBULA FRACTURE SURGERY     OPEN REDUCTION AND PINNING OF SPIRAL FX LEFT FIBULA  . TONSILLECTOMY    . TUBAL LIGATION     Social History   Socioeconomic History  . Marital status: Married    Spouse name: Not on file  . Number of children: Not on file  . Years of education: Not on file  . Highest education level: Not on file  Occupational History  . Occupation: retired physician    Comment: internist  Tobacco Use  . Smoking status: Never Smoker  . Smokeless tobacco: Never Used  Substance and Sexual Activity  . Alcohol use: Yes    Alcohol/week: 0.0 standard drinks    Comment: occ wine  . Drug use: No  . Sexual activity: Never    Birth control/protection: Abstinence  Other Topics Concern  . Not on file  Social History Narrative   Did not rnew lisence 2017   Neg tobacco    Married   HH of 5   Pet cat   Mom with dementia   Social Determinants of Health   Financial Resource Strain:   . Difficulty of Paying Living Expenses:   Food Insecurity:   . Worried About Charity fundraiser in the Last Year:   . Arboriculturist in the Last Year:   Transportation Needs:   . Film/video editor (Medical):   Marland Kitchen Lack of Transportation (Non-Medical):   Physical Activity:   . Days of Exercise per Week:   . Minutes of Exercise per Session:   Stress:   . Feeling of Stress :   Social Connections:   . Frequency of Communication with Friends and Family:   . Frequency of Social Gatherings with Friends and Family:   . Attends Religious Services:   . Active  Member of Clubs or Organizations:   . Attends Archivist Meetings:   Marland Kitchen Marital Status:    Family History  Problem Relation Age of Onset  . Hypertension Mother   . Dementia Mother   . Heart disease Father   . Dementia Father   . Diabetes Father   . Hypertension Brother    Allergies  Allergen Reactions  . Bee Venom Swelling  . Eggs Or Egg-Derived Products Nausea And Vomiting  . Enalapril Maleate     REACTION: Cough  . Lactose Intolerance (Gi) Diarrhea  . Olmesartan Medoxomil     REACTION: Gas and chest pain  . Almond (Diagnostic) Rash  . Amlodipine Other (See Comments)    Fatigue lethargy at 5 mg dose  . Zetia [Ezetimibe] Other (See Comments)    GI cramps   Prior to Admission medications   Medication Sig Start Date End Date Taking? Authorizing Provider  carvedilol (COREG) 12.5 MG tablet Take 1 tablet (12.5 mg total) by mouth 2 (two) times daily with a meal. Needs follow up with labs for refills. 02/22/20  Yes Panosh, Standley Brooking, MD  cholecalciferol (VITAMIN D) 1000 units tablet Take 2,000  Units by mouth daily.    Yes [provider]  Cyanocobalamin (B-12 PO) Take 1 tablet by mouth daily.    Yes [provider]  gabapentin (NEURONTIN) 300 MG capsule Take 1 capsule (300 mg total) by mouth 3 (three) times daily. Or as directed. Schedule follow up for further refills. [phone] 03/14/20  Yes Panosh, Standley Brooking, MD  LANCETS MICRO THIN 33G MISC Use to test blood glucose once daily. 10/22/16  Yes Panosh, Standley Brooking, MD  levothyroxine (SYNTHROID) 112 MCG tablet TAKE 1 TABLET ONCE DAILY. 04/18/20  Yes Panosh, Standley Brooking, MD  Melatonin 5 MG TABS Take 5 mg by mouth at bedtime.   Yes [provider]  metFORMIN (GLUCOPHAGE-XR) 500 MG 24 hr tablet TAKE 1 TABLET BY MOUTH TWICE DAILY WITH A MEAL. 11/27/19  Yes Panosh, Standley Brooking, MD  NONFORMULARY OR COMPOUNDED ITEM Estriol 0.75mg /ml Cream: Insert 1 vaginally three times weekly 03/08/20  Yes Panosh, Standley Brooking, MD  Pitavastatin  Calcium 1 MG TABS Take 1 tablet (1 mg total) by mouth daily. 10/20/19  Yes Panosh, Standley Brooking, MD  Probiotic Product (ALIGN PO) Take by mouth.   Yes [provider]  TRUE METRIX BLOOD GLUCOSE TEST test strip CHECK BLOOD SUGAR ONCE A DAY. 07/06/19  Yes Panosh, Standley Brooking, MD     Positive ROS: Otherwise negative  All other systems have been reviewed and were otherwise negative with the exception of those mentioned in the HPI and as above.  Physical Exam: Constitutional: Alert, well-appearing, no acute distress Ears: External ears without lesions or tenderness. Ear canals mild amount of cerumen buildup in both ears that was cleaned with curette and forceps.  TMs were clear bilaterally.. Nasal: External nose without lesions. Clear nasal passages Oral: Oropharynx clear. Neck: No palpable adenopathy or masses Respiratory: Breathing comfortably  Skin: No facial/neck lesions or rash noted.  Cerumen impaction removal  Date/Time: 04/20/2020 1:54 PM Performed by: Rozetta Nunnery, MD Authorized by: Rozetta Nunnery, MD   Consent:    Consent obtained:  Verbal   Consent given by:  Patient   Risks discussed:  Pain and bleeding Procedure details:    Location:  L ear and R ear   Procedure type: curette and forceps   Post-procedure details:    Inspection:  TM intact and canal normal   Hearing quality:  Improved   Patient tolerance of procedure:  Tolerated well, no immediate complications Comments:     TMs are clear bilaterally.    Assessment: Bilateral cerumen buildup  Plan: She will follow-up as needed  Radene Journey, MD

## 2020-04-29 NOTE — Progress Notes (Signed)
Chief Complaint  Patient presents with  . Follow-up    Doing well    HPI: Valerie Brady 74 y.o. come in for Chronic disease management   DM pre diabetes:  Skin  thigh   Some coarse skin   .   BP  is good  On metformin bid tolerating   Controlling   uti but avoiding baths   vision ok  Perhaps some funny feeling feet but no numbness weakness   BP:  good   HLD statin intolerant med last rx would have cost thousands  ROS: See pertinent positives and negatives per HPI. Had the covid vaccine  [employer]  Past Medical History:  Diagnosis Date  . Carotidynia   . Diverticulitis of colon 05/22/2015  . Gestational diabetes   . Hyperlipidemia   . Hypertension   . Hypothyroidism   . Syncope 2011   reaction to benicar    Family History  Problem Relation Age of Onset  . Hypertension Mother   . Dementia Mother   . Heart disease Father   . Dementia Father   . Diabetes Father   . Hypertension Brother     Social History   Socioeconomic History  . Marital status: Married    Spouse name: Not on file  . Number of children: Not on file  . Years of education: Not on file  . Highest education level: Not on file  Occupational History  . Occupation: retired physician    Comment: internist  Tobacco Use  . Smoking status: Never Smoker  . Smokeless tobacco: Never Used  Vaping Use  . Vaping Use: Never used  Substance and Sexual Activity  . Alcohol use: Yes    Alcohol/week: 0.0 standard drinks    Comment: occ wine  . Drug use: No  . Sexual activity: Never    Birth control/protection: Abstinence  Other Topics Concern  . 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 Difficulty of Paying Living Expenses:   Food Insecurity:   . Worried About Charity fundraiser in the Last Year:   . Arboriculturist in  the Last Year:   Transportation Needs:   . Film/video editor (Medical):   Marland Kitchen Lack of Transportation (Non-Medical):   Physical Activity:   . Days of Exercise per Week:   . Minutes of Exercise per Session:   Stress:   . Feeling of Stress :   Social Connections:   . Frequency of Communication with Friends and Family:   . Frequency of Social Gatherings with Friends and Family:   . Attends Religious Services:   . Active Member of Clubs or Organizations:   . Attends Archivist Meetings:   Marland Kitchen Marital Status:     Outpatient Medications Prior to Visit  Medication Sig Dispense Refill  . cholecalciferol (VITAMIN D) 1000 units tablet Take 2,000 Units by mouth daily.     . Cyanocobalamin (B-12 PO) Take 1 tablet by mouth daily.     Marland Kitchen glucose blood (TRUE METRIX BLOOD GLUCOSE TEST) test strip CHECK BLOOD SUGAR ONCE A DAY.    Marland Kitchen LANCETS MICRO THIN 33G MISC Use to test blood glucose once daily. 100 each 3  . Loratadine 10 MG CAPS  as needed.     . Melatonin 5 MG TABS Take 5 mg by mouth at bedtime.    . metFORMIN (GLUCOPHAGE-XR) 500 MG 24 hr tablet TAKE 1 TABLET BY MOUTH TWICE DAILY WITH A MEAL. 180 tablet 3  . NONFORMULARY OR COMPOUNDED ITEM Estriol 0.75mg /ml Cream: Insert 1 vaginally three times weekly 36 each 1  . Probiotic Product (ALIGN PO) Take by mouth.    . TRUE METRIX BLOOD GLUCOSE TEST test strip CHECK BLOOD SUGAR ONCE A DAY. 50 strip 3  . carvedilol (COREG) 12.5 MG tablet Take 1 tablet (12.5 mg total) by mouth 2 (two) times daily with a meal. Needs follow up with labs for refills. 180 tablet 0  . gabapentin (NEURONTIN) 300 MG capsule Take 1 capsule (300 mg total) by mouth 3 (three) times daily. Or as directed. Schedule follow up for further refills. [phone] 90 capsule 0  . levothyroxine (SYNTHROID) 112 MCG tablet TAKE 1 TABLET ONCE DAILY. 90 tablet 0  . Pitavastatin Calcium 1 MG TABS Take 1 tablet (1 mg total) by mouth daily. (Patient not taking: Reported on 05/02/2020) 30  tablet 1   No facility-administered medications prior to visit.     EXAM:  BP 120/70   Pulse 74   Temp 97.7 F (36.5 C) (Temporal)   Ht 5\' 5"  (1.651 m)   Wt 166 lb 3.2 oz (75.4 kg)   SpO2 97%   BMI 27.66 kg/m   Body mass index is 27.66 kg/m.  GENERAL: vitals reviewed and listed above, alert, oriented, appears well hydrated and in no acute distress HEENT: atraumatic, conjunctiva  clear, no obvious abnormalities on inspection of external nose and ears OP :masked  NECK: no obvious masses on inspection palpation  LUNGS: clear to auscultation bilaterally, no wheezes, rales or rhonchi, good air movement CV: HRRR, no clubbing cyanosis or  peripheral edema nl cap refill  MS: moves all extremities without noticeable focal  Abnormality dry skin no obv lesins  PSYCH: pleasant and cooperative, no obvious depression or anxiety Lab Results  Component Value Date   WBC 8.7 10/14/2019   HGB 13.2 10/14/2019   HCT 39.3 10/14/2019   PLT 354.0 10/14/2019   GLUCOSE 112 (H) 10/14/2019   CHOL 273 (H) 10/14/2019   TRIG 244.0 (H) 10/14/2019   HDL 42.60 10/14/2019   LDLDIRECT 179.0 10/14/2019   LDLCALC 174 (H) 02/27/2016   ALT 10 10/14/2019   AST 12 10/14/2019   NA 139 10/14/2019   K 4.1 10/14/2019   CL 103 10/14/2019   CREATININE 0.76 10/14/2019   BUN 14 10/14/2019   CO2 28 10/14/2019   TSH 1.91 10/14/2019   HGBA1C 6.2 (A) 05/02/2020   MICROALBUR 1.5 10/14/2019   BP Readings from Last 3 Encounters:  05/02/20 120/70  10/20/19 120/64  09/24/18 120/70    ASSESSMENT AND PLAN:  Discussed the following assessment and plan:  Type 2 diabetes mellitus without complication, without long-term current use of insulin ([place]) - Plan: POCT glycosylated hemoglobin (Hb A1C)  Medication management  Essential hypertension  Hyperlipidemia associated with type 2 diabetes mellitus ([place]) - statin intoleracnt  and pit overly expensive so no meds at this time  Hypothyroidism, unspecified  type  Statin intolerance a1c better   Controlled on metformin and lsi  Cost of med trial prohibitive and agree not worth it  At this time  No change meds  Plan 6 mos cpx and or in person visit labs etc   -Patient advised to return or notify  health care team  if  new concerns arise.  There are no Patient Instructions on file for this visit.   Standley Brooking. Tifany Hirsch M.D.

## 2020-05-02 ENCOUNTER — Other Ambulatory Visit: Payer: Self-pay

## 2020-05-02 ENCOUNTER — Encounter: Payer: Self-pay | Admitting: Internal Medicine

## 2020-05-02 ENCOUNTER — Ambulatory Visit (INDEPENDENT_AMBULATORY_CARE_PROVIDER_SITE_OTHER): Payer: Medicare Other | Admitting: Internal Medicine

## 2020-05-02 VITALS — BP 120/70 | HR 74 | Temp 97.7°F | Ht 65.0 in | Wt 166.2 lb

## 2020-05-02 DIAGNOSIS — E039 Hypothyroidism, unspecified: Secondary | ICD-10-CM

## 2020-05-02 DIAGNOSIS — Z79899 Other long term (current) drug therapy: Secondary | ICD-10-CM | POA: Diagnosis not present

## 2020-05-02 DIAGNOSIS — E119 Type 2 diabetes mellitus without complications: Secondary | ICD-10-CM | POA: Diagnosis not present

## 2020-05-02 DIAGNOSIS — I10 Essential (primary) hypertension: Secondary | ICD-10-CM | POA: Diagnosis not present

## 2020-05-02 DIAGNOSIS — E785 Hyperlipidemia, unspecified: Secondary | ICD-10-CM

## 2020-05-02 DIAGNOSIS — Z789 Other specified health status: Secondary | ICD-10-CM

## 2020-05-02 DIAGNOSIS — E1169 Type 2 diabetes mellitus with other specified complication: Secondary | ICD-10-CM

## 2020-05-02 LAB — POCT GLYCOSYLATED HEMOGLOBIN (HGB A1C): Hemoglobin A1C: 6.2 % — AB (ref 4.0–5.6)

## 2020-05-02 MED ORDER — LEVOTHYROXINE SODIUM 112 MCG PO TABS: 112.0000 ug | ORAL_TABLET | Freq: Every day | ORAL | 2 refills | Status: DC

## 2020-05-02 MED ORDER — CARVEDILOL 12.5 MG PO TABS: 12.5000 mg | ORAL_TABLET | Freq: Two times a day (BID) | ORAL | 3 refills | Status: DC

## 2020-05-02 MED ORDER — GABAPENTIN 300 MG PO CAPS: 300.0000 mg | ORAL_CAPSULE | Freq: Three times a day (TID) | ORAL | 3 refills | Status: DC

## 2020-05-10 DIAGNOSIS — L570 Actinic keratosis: Secondary | ICD-10-CM | POA: Diagnosis not present

## 2020-05-10 DIAGNOSIS — Z85828 Personal history of other malignant neoplasm of skin: Secondary | ICD-10-CM | POA: Diagnosis not present

## 2020-05-10 DIAGNOSIS — L853 Xerosis cutis: Secondary | ICD-10-CM | POA: Diagnosis not present

## 2020-05-10 DIAGNOSIS — L821 Other seborrheic keratosis: Secondary | ICD-10-CM | POA: Diagnosis not present

## 2020-05-24 ENCOUNTER — Other Ambulatory Visit: Payer: Self-pay

## 2020-05-24 ENCOUNTER — Other Ambulatory Visit: Payer: Self-pay | Admitting: Internal Medicine

## 2020-05-24 MED ORDER — CARVEDILOL 12.5 MG PO TABS: 12.5000 mg | ORAL_TABLET | Freq: Two times a day (BID) | ORAL | 1 refills | Status: DC

## 2020-06-01 ENCOUNTER — Telehealth: Payer: Self-pay | Admitting: Internal Medicine

## 2020-06-01 NOTE — Telephone Encounter (Signed)
Left message for patient to schedule Annual Wellness Visit.  Please schedule with Nurse Health Advisor Shannon Crews, RN at [facility]  

## 2020-06-07 ENCOUNTER — Other Ambulatory Visit: Payer: Self-pay | Admitting: Internal Medicine

## 2020-07-07 DIAGNOSIS — H40013 Open angle with borderline findings, low risk, bilateral: Secondary | ICD-10-CM | POA: Diagnosis not present

## 2020-07-07 DIAGNOSIS — H2513 Age-related nuclear cataract, bilateral: Secondary | ICD-10-CM | POA: Diagnosis not present

## 2020-07-07 DIAGNOSIS — E119 Type 2 diabetes mellitus without complications: Secondary | ICD-10-CM | POA: Diagnosis not present

## 2020-07-07 DIAGNOSIS — H43811 Vitreous degeneration, right eye: Secondary | ICD-10-CM | POA: Diagnosis not present

## 2020-07-07 LAB — HM DIABETES EYE EXAM

## 2020-07-12 DIAGNOSIS — R300 Dysuria: Secondary | ICD-10-CM | POA: Diagnosis not present

## 2020-07-12 DIAGNOSIS — N3001 Acute cystitis with hematuria: Secondary | ICD-10-CM | POA: Diagnosis not present

## 2020-07-12 DIAGNOSIS — R350 Frequency of micturition: Secondary | ICD-10-CM | POA: Diagnosis not present

## 2020-07-14 DIAGNOSIS — D485 Neoplasm of uncertain behavior of skin: Secondary | ICD-10-CM | POA: Diagnosis not present

## 2020-07-14 DIAGNOSIS — L820 Inflamed seborrheic keratosis: Secondary | ICD-10-CM | POA: Diagnosis not present

## 2020-07-22 ENCOUNTER — Other Ambulatory Visit: Payer: Self-pay | Admitting: Internal Medicine

## 2020-08-07 DIAGNOSIS — Z20828 Contact with and (suspected) exposure to other viral communicable diseases: Secondary | ICD-10-CM | POA: Diagnosis not present

## 2020-08-16 ENCOUNTER — Ambulatory Visit: Payer: Medicare Other | Attending: Internal Medicine

## 2020-08-16 DIAGNOSIS — Z23 Encounter for immunization: Secondary | ICD-10-CM

## 2020-08-16 NOTE — Progress Notes (Signed)
   Covid-19 Vaccination Clinic  Name:  Valerie Brady    MRN: [id number] DOB: 06/10/1946  08/16/2020  [name] was observed post Covid-19 immunization for 15 minutes without incident. She was provided with Vaccine Information Sheet and instruction to access the V-Safe system.   [name] was instructed to call 911 with any severe reactions post vaccine: Marland Kitchen Difficulty breathing  . Swelling of face and throat  . A fast heartbeat  . A bad rash all over body  . Dizziness and weakness

## 2020-08-17 NOTE — Telephone Encounter (Signed)
Please make her appt   For next week  tues wed or Friday

## 2020-08-23 ENCOUNTER — Encounter: Payer: Self-pay | Admitting: Internal Medicine

## 2020-08-23 ENCOUNTER — Ambulatory Visit (INDEPENDENT_AMBULATORY_CARE_PROVIDER_SITE_OTHER)
Admission: RE | Admit: 2020-08-23 | Discharge: 2020-08-23 | Disposition: A | Discharge status: Home / Self Care | Payer: Medicare Other | Source: Ambulatory Visit | Attending: Internal Medicine | Admitting: Internal Medicine

## 2020-08-23 ENCOUNTER — Ambulatory Visit (INDEPENDENT_AMBULATORY_CARE_PROVIDER_SITE_OTHER): Payer: Medicare Other | Admitting: Internal Medicine

## 2020-08-23 ENCOUNTER — Other Ambulatory Visit: Payer: Self-pay

## 2020-08-23 VITALS — BP 140/84 | HR 67 | Ht 65.0 in | Wt 166.0 lb

## 2020-08-23 DIAGNOSIS — M549 Dorsalgia, unspecified: Secondary | ICD-10-CM | POA: Diagnosis not present

## 2020-08-23 DIAGNOSIS — G8929 Other chronic pain: Secondary | ICD-10-CM

## 2020-08-23 DIAGNOSIS — M545 Low back pain, unspecified: Secondary | ICD-10-CM | POA: Diagnosis not present

## 2020-08-23 DIAGNOSIS — M533 Sacrococcygeal disorders, not elsewhere classified: Secondary | ICD-10-CM

## 2020-08-23 NOTE — Progress Notes (Signed)
Chief Complaint  Patient presents with  . 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  See my chart messaging    Issues with pain around right si joint for 2 years   Some pain after  [place]ing [place] .  Marland Kitchen  Then  3 weeks ago had sudden  Severe pain Sitting to standing   Serious pain  right si joinjt area and then radated to  buttock pain and lateral anterior  to knee .    Worse when sits    Nerve irritation feeling.  Then got   Booster and after bed rest  Calmed down and  helped some.  No weakness numbness    Improved sice onset but persists . 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  And got better  Over time.    otherwise normal back pain off and on  Has been active no injury although yoga    Some posistion avoid for  Discomfort  Mom had lumbar fusion and spondylestheisi ROS: See pertinent positives and negatives per HPI. No weakness or  Fall or  Systemic sx   Past Medical History:  Diagnosis Date  . Carotidynia   . Diverticulitis of colon 05/22/2015  . Gestational diabetes   . Hyperlipidemia   . Hypertension   . Hypothyroidism   . Syncope 2011   reaction to benicar    Family History  Problem Relation Age of Onset  . Hypertension Mother   . Dementia Mother   . Heart disease Father   . Dementia Father   . Diabetes Father   . Hypertension Brother     Social History   Socioeconomic History  . Marital status: Married    Spouse name: Not on file  . Number of children: Not on file  . Years of education: Not on file  . Highest education level: Not on file  Occupational History  . Occupation: retired physician    Comment: internist  Tobacco Use  . Smoking status: Never Smoker  . Smokeless tobacco: Never Used    Vaping Use  . Vaping Use: Never used  Substance and Sexual Activity  . Alcohol use: Yes    Alcohol/week: 0.0 standard drinks    Comment: occ wine  . Drug use: No  . Sexual activity: Never    Birth control/protection: Abstinence  Other Topics Concern  . 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 Difficulty of Paying Living Expenses: Not on file  Food Insecurity:   . Worried About Charity fundraiser in the Last Year: Not on file  . Ran Out of Food in the Last Year: Not on file  Transportation Needs:   . Lack of Transportation (Medical): Not on file  . Lack of Transportation (Non-Medical): Not on file  Physical Activity:   .  Days of Exercise per Week: Not on file  . Minutes of Exercise per Session: Not on file  Stress:   . Feeling of Stress : Not on file  Social Connections:   . Frequency of Communication with Friends and Family: Not on file  . Frequency of Social Gatherings with Friends and Family: Not on file  . Attends Religious Services: Not on file  . Active Member of Clubs or Organizations: Not on file  . Attends Archivist Meetings: Not on file  . Marital Status: Not on file    Outpatient Medications Prior to Visit  Medication Sig Dispense Refill  . carvedilol (COREG) 12.5 MG tablet Take 1 tablet (12.5 mg total) by mouth 2 (two) times daily with a meal. 180 tablet 1  . cholecalciferol (VITAMIN D) 1000 units tablet Take 2,000 Units by mouth daily.     . Cyanocobalamin (B-12 PO) Take 1 tablet by mouth daily.     Marland Kitchen gabapentin (NEURONTIN) 300 MG capsule TAKE (1) CAPSULE THREE TIMES DAILY,OR AS DIRECTED 90 capsule 1  . glucose blood (TRUE METRIX BLOOD GLUCOSE TEST) test strip CHECK BLOOD SUGAR ONCE A DAY.    Marland Kitchen LANCETS MICRO THIN 33G MISC Use to test blood glucose  once daily. 100 each 3  . levothyroxine (SYNTHROID) 112 MCG tablet TAKE 1 TABLET ONCE DAILY. 90 tablet 0  . Loratadine 10 MG CAPS as needed.     . Melatonin 5 MG TABS Take 5 mg by mouth at bedtime.    . metFORMIN (GLUCOPHAGE-XR) 500 MG 24 hr tablet TAKE 1 TABLET BY MOUTH TWICE DAILY WITH A MEAL. 180 tablet 3  . NONFORMULARY OR COMPOUNDED ITEM Estriol 0.75mg /ml Cream: Insert 1 vaginally three times weekly 36 each 1  . Probiotic Product (ALIGN PO) Take by mouth.    . TRUE METRIX BLOOD GLUCOSE TEST test strip CHECK BLOOD SUGAR ONCE A DAY. 50 strip 3   No facility-administered medications prior to visit.     EXAM:  BP 140/84   Pulse 67   Ht 5\' 5"  (1.651 m)   Wt 166 lb (75.3 kg)   SpO2 96%   BMI 27.62 kg/m   Body mass index is 27.62 kg/m.  GENERAL: vitals reviewed and listed above, alert, oriented, appears well hydrated and in no acute distress prefers to stand  HEENT: atraumatic, conjunctiva  clear, no obvious abnormalities on inspection of external nose and ears OP : masked  NECK: no obvious masses on inspection palpation  Point tender right si area not midline   Toe heel ok  Can stand on each leg  And flex    No secondary spasm today  Gait   Almost normal cautious  No foot drag.  MS: moves all extremities without noticeable focal  abnormality PSYCH: pleasant and cooperative, no obvious depression or anxiety  BP Readings from Last 3 Encounters:  08/23/20 140/84  05/02/20 120/70  10/20/19 120/64    ASSESSMENT AND PLAN:  Discussed the following assessment and plan:  Back pain with radiation - Plan: DG Lumbar Spine Complete, DG Si Joints, Ambulatory referral to Sports Medicine  Chronic right SI joint pain - Plan: DG Lumbar Spine Complete, DG Si Joints, Ambulatory referral to Sports Medicine  x ray  Today    Unless indicated otherwise  Refer  To SM or rehab   Dr Tamala Julian  Can take xtra gabapentin if needed   Steroid option but has dm  Physical modalities may be the best  For  now.  -Patient advised to return or notify health care team  if  new concerns arise. Review discuss and counsel   30 minutes  Patient Instructions  Get x ray and we will refer.    As planned  For next step . Physical modalities  .   Valerie Brady. Valerie Brady M.D.

## 2020-08-23 NOTE — Progress Notes (Signed)
No alarming findings  some disc disease , si joints unremarkable Will do referral as discussed  for non surgical  treatment interventions

## 2020-08-23 NOTE — Patient Instructions (Signed)
Get x ray and we will refer.    As planned  For next step . Physical modalities  .

## 2020-08-25 MED ORDER — ESTRADIOL 0.1 MG/GM VA CREA: 1.0000 | TOPICAL_CREAM | VAGINAL | 5 refills | Status: DC

## 2020-08-25 NOTE — Telephone Encounter (Signed)
Please refill  Her med as she request for 6 months

## 2020-08-29 ENCOUNTER — Other Ambulatory Visit: Payer: Self-pay | Admitting: Internal Medicine

## 2020-08-29 NOTE — Telephone Encounter (Signed)
Patient is stating the Estrace cream needs a new prescription that states 1-2 grams 3 times a week sent to [facility].  Patient is out and has missed 2 doses.

## 2020-08-30 ENCOUNTER — Other Ambulatory Visit: Payer: Self-pay | Admitting: Internal Medicine

## 2020-08-30 MED ORDER — ESTRADIOL 0.1 MG/GM VA CREA
TOPICAL_CREAM | VAGINAL | 5 refills | Status: DC
Start: 2020-08-30 — End: 2020-08-31

## 2020-08-31 ENCOUNTER — Ambulatory Visit (INDEPENDENT_AMBULATORY_CARE_PROVIDER_SITE_OTHER): Payer: Medicare Other | Admitting: Family Medicine

## 2020-08-31 ENCOUNTER — Other Ambulatory Visit: Payer: Self-pay

## 2020-08-31 ENCOUNTER — Encounter: Payer: Self-pay | Admitting: Family Medicine

## 2020-08-31 DIAGNOSIS — M533 Sacrococcygeal disorders, not elsewhere classified: Secondary | ICD-10-CM

## 2020-08-31 DIAGNOSIS — M999 Biomechanical lesion, unspecified: Secondary | ICD-10-CM | POA: Insufficient documentation

## 2020-08-31 NOTE — Patient Instructions (Addendum)
Good to see you   SI joint Voltaren 2x daily Don't try to win yoga See me in 4-6 weeks

## 2020-08-31 NOTE — Assessment & Plan Note (Signed)
Patient is somewhat of a sacroiliac dysfunction did respond fairly well to osteopathic manipulation today.  Patient would like to try it just with home exercises at this time we will hold on any other type of medications.  We discussed icing regimen, we discussed different treatment options including injections may be necessary in the long run.  Patient will do well with conservative therapy and see me again in 4 to 6 weeks

## 2020-08-31 NOTE — Progress Notes (Signed)
[facility] [place] Phone: [phone] Subjective:   Rito Ehrlich, am serving as a scribe for Dr. Hulan Saas.   I'm seeing this patient by the request  of:  Panosh, Standley Brooking, MD  CC: Low back pain  [id number]  Valerie Brady is a 74 y.o. female coming in with complaint of back pain.  Sacral iliac joint on the right side radiates to the buttock to the side of her R thigh/knee. Pain going on for about 2 years off and on. Radicular pain and worsening pain has been about a month. Patient stopped dancing when the pain started when the bad pain started stopped yoga. Patient has been resting to help ease the pain. Cannot handle NSAIDS but the pain is so intermittent that she doesn't take medications, worsening pain going from sitting to standing.  States when she is standing she can walk without any significant discomfort.    Patient did have x-rays of the lumbar spine done that were independently visualized by me showing the patient does have some mild degenerative disc disease and lower lumbar facet arthropathy from L1-L3  Past Medical History:  Diagnosis Date  . Carotidynia   . Diverticulitis of colon 05/22/2015  . Gestational diabetes   . Hyperlipidemia   . Hypertension   . Hypothyroidism   . Syncope 2011   reaction to benicar   Past Surgical History:  Procedure Laterality Date  . cervical polyp removed    . FIBULA FRACTURE SURGERY     OPEN REDUCTION AND PINNING OF SPIRAL FX LEFT FIBULA  . TONSILLECTOMY    . TUBAL LIGATION     Social History   Socioeconomic History  . Marital status: Married    Spouse name: Not on file  . Number of children: Not on file  . Years of education: Not on file  . Highest education level: Not on file  Occupational History  . Occupation: retired physician    Comment: internist  Tobacco Use  . Smoking status: Never Smoker  . Smokeless tobacco: Never Used  Vaping Use  .  Vaping Use: Never used  Substance and Sexual Activity  . Alcohol use: Yes    Alcohol/week: 0.0 standard drinks    Comment: occ wine  . Drug use: No  . Sexual activity: Never    Birth control/protection: Abstinence  Other Topics Concern  . 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 Difficulty of Paying Living Expenses: Not on file  Food Insecurity:   . Worried About Charity fundraiser in the Last Year: Not on file  . Ran Out of Food in the Last Year: Not on file  Transportation Needs:   . Lack of Transportation (Medical): Not on file  . Lack of Transportation (Non-Medical): Not on file  Physical Activity:   . Days of Exercise per Week: Not on file  . Minutes of Exercise per Session: Not on file  Stress:   . Feeling of Stress : Not on file  Social Connections:   . Frequency of Communication with Friends and Family: Not on file  . Frequency of Social Gatherings with Friends and Family: Not on  file  . Attends Religious Services: Not on file  . Active Member of Clubs or Organizations: Not on file  . Attends Archivist Meetings: Not on file  . Marital Status: Not on file   Allergies  Allergen Reactions  . Other Other (See Comments)  . Bee Venom Swelling  . Eggs Or Egg-Derived Products Nausea And Vomiting  . Enalapril Maleate     REACTION: Cough  . Enalapril Maleate Other (See Comments)    REACTION: Cough  . Lactose Diarrhea  . Lactose Intolerance (Gi) Diarrhea  . Olmesartan Other (See Comments)    REACTION: Gas and chest pain  . Olmesartan Medoxomil     REACTION: Gas and chest pain  . Almond (Diagnostic) Rash  . Almond Oil Rash  . Amlodipine Other (See Comments)    Fatigue lethargy at 5 mg dose  . Latex Rash  . Zetia [Ezetimibe] Other (See Comments)    GI  cramps   Family History  Problem Relation Age of Onset  . Hypertension Mother   . Dementia Mother   . Heart disease Father   . Dementia Father   . Diabetes Father   . Hypertension Brother     Current Outpatient Medications (Endocrine & Metabolic):  .  levothyroxine (SYNTHROID) 112 MCG tablet, TAKE 1 TABLET ONCE DAILY. .  metFORMIN (GLUCOPHAGE-XR) 500 MG 24 hr tablet, TAKE 1 TABLET BY MOUTH TWICE DAILY WITH A MEAL.  Current Outpatient Medications (Cardiovascular):  .  carvedilol (COREG) 12.5 MG tablet, Take 1 tablet (12.5 mg total) by mouth 2 (two) times daily with a meal.  Current Outpatient Medications (Respiratory):  Marland Kitchen  Loratadine 10 MG CAPS, as needed.    Current Outpatient Medications (Hematological):  Marland Kitchen  Cyanocobalamin (B-12 PO), Take 1 tablet by mouth daily.   Current Outpatient Medications (Other):  .  cholecalciferol (VITAMIN D) 1000 units tablet, Take 2,000 Units by mouth daily.  Marland Kitchen  estradiol (ESTRACE) 0.1 MG/GM vaginal cream, INSERT 1ML (4 CLICKS) VAGINALLY 3 TIMES WEEKLY. .  gabapentin (NEURONTIN) 300 MG capsule, TAKE (1) CAPSULE THREE TIMES DAILY,OR AS DIRECTED .  glucose blood (TRUE METRIX BLOOD GLUCOSE TEST) test strip, CHECK BLOOD SUGAR ONCE A DAY. Marland Kitchen  LANCETS MICRO THIN 33G MISC, Use to test blood glucose once daily. .  Melatonin 5 MG TABS, Take 5 mg by mouth at bedtime. .  NONFORMULARY OR COMPOUNDED ITEM, Estriol 0.75mg /ml Cream: Insert 1 vaginally three times weekly .  Probiotic Product (ALIGN PO), Take by mouth. .  TRUE METRIX BLOOD GLUCOSE TEST test strip, CHECK BLOOD SUGAR ONCE A DAY.   Reviewed prior external information including notes and imaging from  primary care provider As well as notes that were available from care everywhere and other healthcare systems.  Past medical history, social, surgical and family history all reviewed in electronic medical record.  No pertanent information unless stated regarding to the chief complaint.   Review of  Systems:  No headache, visual changes, nausea, vomiting, diarrhea, constipation, dizziness, abdominal pain, skin rash, fevers, chills, night sweats, weight loss, swollen lymph nodes, body aches, joint swelling, chest pain, shortness of breath, mood changes. POSITIVE muscle aches  Objective  Blood pressure 134/82, pulse 69, height 5\' 5"  (1.651 m), weight 167 lb (75.8 kg), SpO2 98 %.   General: No apparent distress alert and oriented x3 mood and affect normal, dressed appropriately.  HEENT: Pupils equal, extraocular movements intact  Respiratory: Patient's speak in full sentences and does not appear short of breath  Cardiovascular: No lower extremity edema, non tender, no erythema  Neuro: Cranial nerves II through XII are intact, neurovascularly intact in all extremities with 2+ DTRs and 2+ pulses.  Gait normal with good balance and coordination.  MSK:  Non tender with full range of motion and good stability and symmetric strength and tone of shoulders, elbows, wrist, hip, knee and ankles bilaterally.  Very mild arthritic changes of multiple joints Low back exam does have some mild loss of lordosis.  Tenderness noted over the sacroiliac joint right greater than left.  Tightness noted with Loura Halt on the right compared to the left.  5 out of 5 strength of the lower extremities  Osteopathic findings  T6 extended rotated and side bent left L1 flexed rotated and side bent right Sacrum right on right  [id number]; 15 additional minutes spent for Therapeutic exercises as stated in above notes.  This included exercises focusing on stretching, strengthening, with significant focus on eccentric aspects.   Long term goals include an improvement in range of motion, strength, endurance as well as avoiding reinjury. Patient's frequency would include in 1-2 times a day, 3-5 times a week for a duration of 6-12 weeks. Low back exercises that included:  Pelvic tilt/bracing instruction to focus on control of the  pelvic girdle and lower abdominal muscles  Glute strengthening exercises, focusing on proper firing of the glutes without engaging the low back muscles Proper stretching techniques for maximum relief for the hamstrings, hip flexors, low back and some rotation where tolerated   Proper technique shown and discussed handout in great detail with ATC.  All questions were discussed and answered.      Impression and Recommendations:     The above documentation has been reviewed and is accurate and complete Lyndal Pulley, DO

## 2020-09-06 DIAGNOSIS — Z23 Encounter for immunization: Secondary | ICD-10-CM | POA: Diagnosis not present

## 2020-09-13 ENCOUNTER — Encounter: Payer: Self-pay | Admitting: Internal Medicine

## 2020-09-13 ENCOUNTER — Other Ambulatory Visit: Payer: Medicare Other

## 2020-09-13 ENCOUNTER — Telehealth (INDEPENDENT_AMBULATORY_CARE_PROVIDER_SITE_OTHER): Payer: Medicare Other | Admitting: Internal Medicine

## 2020-09-13 ENCOUNTER — Other Ambulatory Visit: Payer: Self-pay | Admitting: Internal Medicine

## 2020-09-13 DIAGNOSIS — R3989 Other symptoms and signs involving the genitourinary system: Secondary | ICD-10-CM

## 2020-09-13 DIAGNOSIS — Z87440 Personal history of urinary (tract) infections: Secondary | ICD-10-CM | POA: Diagnosis not present

## 2020-09-13 DIAGNOSIS — R300 Dysuria: Secondary | ICD-10-CM

## 2020-09-13 MED ORDER — SULFAMETHOXAZOLE-TRIMETHOPRIM 800-160 MG PO TABS: 1.0000 | ORAL_TABLET | Freq: Two times a day (BID) | ORAL | 0 refills | Status: DC

## 2020-09-13 NOTE — Progress Notes (Signed)
Virtual Visit via Video Note  I connected with@ on 09/13/20 at  4:00 PM EDT by a video enabled telemedicine application and verified that I am speaking with the correct person using two identifiers. Location patient: home Location provider:work office Persons participating in the virtual visit: patient, provider  [place] national recommendations  regarding COVID 19 pandemic   video visit is advised over in office visit for this patient.  Patient aware  of the limitations of evaluation and management by telemedicine and  availability of in person appointments. and agreed to proceed.   HPI: Valerie Brady presents for video visit onset 2+ days of typical uti sx  Dysuria  Frequency without blood fever although can feel puny  No fever flank pain  Last uti  Local  uc rx macrobid  Aug 31 got better  Record  Revealed  less than 10k per ml so not ided   ROS: See pertinent positives and negatives per HPI.  Past Medical History:  Diagnosis Date  . Carotidynia   . Diverticulitis of colon 05/22/2015  . Gestational diabetes   . Hyperlipidemia   . Hypertension   . Hypothyroidism   . Syncope 2011   reaction to benicar    Past Surgical History:  Procedure Laterality Date  . cervical polyp removed    . FIBULA FRACTURE SURGERY     OPEN REDUCTION AND PINNING OF SPIRAL FX LEFT FIBULA  . TONSILLECTOMY    . TUBAL LIGATION      Family History  Problem Relation Age of Onset  . Hypertension Mother   . Dementia Mother   . Heart disease Father   . Dementia Father   . Diabetes Father   . Hypertension Brother     Social History   Tobacco Use  . Smoking status: Never Smoker  . Smokeless tobacco: Never Used  Vaping Use  . Vaping Use: Never used  Substance Use Topics  . Alcohol use: Yes    Alcohol/week: 0.0 standard drinks    Comment: occ wine  . Drug use: No      Current Outpatient Medications:  .  carvedilol (COREG) 12.5 MG tablet, Take 1 tablet (12.5 mg total) by mouth 2 (two)  times daily with a meal., Disp: 180 tablet, Rfl: 1 .  cholecalciferol (VITAMIN D) 1000 units tablet, Take 2,000 Units by mouth daily. , Disp: , Rfl:  .  Cyanocobalamin (B-12 PO), Take 1 tablet by mouth daily. , Disp: , Rfl:  .  estradiol (ESTRACE) 0.1 MG/GM vaginal cream, INSERT 1ML (4 CLICKS) VAGINALLY 3 TIMES WEEKLY., Disp: 36 g, Rfl: 0 .  gabapentin (NEURONTIN) 300 MG capsule, TAKE (1) CAPSULE THREE TIMES DAILY,OR AS DIRECTED, Disp: 90 capsule, Rfl: 1 .  glucose blood (TRUE METRIX BLOOD GLUCOSE TEST) test strip, CHECK BLOOD SUGAR ONCE A DAY., Disp: , Rfl:  .  LANCETS MICRO THIN 33G MISC, Use to test blood glucose once daily., Disp: 100 each, Rfl: 3 .  levothyroxine (SYNTHROID) 112 MCG tablet, TAKE 1 TABLET ONCE DAILY., Disp: 90 tablet, Rfl: 0 .  Loratadine 10 MG CAPS, as needed. , Disp: , Rfl:  .  Melatonin 5 MG TABS, Take 5 mg by mouth at bedtime., Disp: , Rfl:  .  metFORMIN (GLUCOPHAGE-XR) 500 MG 24 hr tablet, TAKE 1 TABLET BY MOUTH TWICE DAILY WITH A MEAL., Disp: 180 tablet, Rfl: 3 .  NONFORMULARY OR COMPOUNDED ITEM, Estriol 0.75mg /ml Cream: Insert 1 vaginally three times weekly, Disp: 36 each, Rfl: 1 .  Probiotic  Product (ALIGN PO), Take by mouth., Disp: , Rfl:  .  sulfamethoxazole-trimethoprim (BACTRIM DS) 800-160 MG tablet, Take 1 tablet by mouth 2 (two) times daily., Disp: 10 tablet, Rfl: 0 .  TRUE METRIX BLOOD GLUCOSE TEST test strip, CHECK BLOOD SUGAR ONCE A DAY., Disp: 50 strip, Rfl: 3  EXAM: BP Readings from Last 3 Encounters:  08/31/20 134/82  08/23/20 140/84  05/02/20 120/70    VITALS per patient if applicable:  GENERAL: alert, oriented, appears well and in no acute distress non toxic  HEENT: atraumatic, conjunttiva clear, no obvious abnormalities on inspection of external nose and ears NECK: normal movements of the head and neck LUNGS: on inspection no signs of respiratory distress, breathing rate appears normal, no obvious gross SOB, gasping or wheezing CV: no obvious  cyanosis PSYCH/NEURO: pleasant and cooperative, no obvious depression or anxiety, speech and thought processing grossly intact Urinalysis was not  Done cause not ordered correctly   But had  micro albumin and u cx sent  Off   ASSESSMENT AND PLAN:  Discussed the following assessment and plan:    ICD-10-CM   1. Dysuria  R30.0 Microalbumin/Creatinine Ratio, Urine    CULTURE, URINE COMPREHENSIVE  2. History of recurrent UTIs  Z87.440   3. Suspected UTI  R39.89    Although no ua   Sent  Typical sx and ucx pending so outcome should be the same  Based  On hx  Alternate antibiotic  Can take sulfa  Bactrim sent for 5 days worth  Counseled.   Expectant management and discussion of plan and treatment with opportunity to ask questions and all were answered. The patient agreed with the plan and demonstrated an understanding of the instructions.   Advised to call back or seek an in-person evaluation if worsening  or having  further concerns . Return if symptoms worsen or fail to improve, for depending on culture .    Shanon Ace, MD

## 2020-09-14 LAB — CULTURE, URINE COMPREHENSIVE
MICRO NUMBER:: 11148786
SPECIMEN QUALITY:: ADEQUATE

## 2020-09-14 LAB — MICROALBUMIN / CREATININE URINE RATIO
Creatinine, Urine: 384 mg/dL — ABNORMAL HIGH (ref 20–275)
Microalb Creat Ratio: 22 mcg/mg creat (ref <30)
Microalb, Ur: 8.5 mg/dL

## 2020-09-15 NOTE — Progress Notes (Signed)
So   the results are not definitive   could be enterococcus or strep /staph If not getting better leg me know and we can change to amox of macrobid

## 2020-09-27 NOTE — Progress Notes (Signed)
[facility] [address] [place] Phone: [phone] Subjective:   I Valerie Brady am serving as a Education administrator for Dr. Hulan Saas.  This visit occurred during the SARS-CoV-2 public health emergency.  Safety protocols were in place, including screening questions prior to the visit, additional usage of staff PPE, and extensive cleaning of exam room while observing appropriate contact time as indicated for disinfecting solutions.   I'm seeing this patient by the request  of:  Panosh, Standley Brooking, MD  CC: Back and neck pain follow-up  [id number]  Valerie Brady is a 74 y.o. female coming in with complaint of back and neck pain. OMT 08/31/2020. Patient states she is much better than last time. A little SI joint pain on occasion. States she has thoracic facet joint pain with playing the piano.  Patient has been doing the exercises occasionally but did do better with them for the first 2 weeks.  Medications patient has been prescribed: Gabapentin Taking: Intermittently         Reviewed prior external information including notes and imaging from previsou exam, outside providers and external EMR if available.   As well as notes that were available from care everywhere and other healthcare systems.  Past medical history, social, surgical and family history all reviewed in electronic medical record.  No pertanent information unless stated regarding to the chief complaint.   Past Medical History:  Diagnosis Date  . Carotidynia   . Diverticulitis of colon 05/22/2015  . Gestational diabetes   . Hyperlipidemia   . Hypertension   . Hypothyroidism   . Syncope 2011   reaction to benicar    Allergies  Allergen Reactions  . Other Other (See Comments)  . Bee Venom Swelling  . Eggs Or Egg-Derived Products Nausea And Vomiting  . Enalapril Maleate     REACTION: Cough  . Enalapril Maleate Other (See Comments)    REACTION: Cough  . Lactose  Diarrhea  . Lactose Intolerance (Gi) Diarrhea  . Olmesartan Other (See Comments)    REACTION: Gas and chest pain  . Olmesartan Medoxomil     REACTION: Gas and chest pain  . Almond (Diagnostic) Rash  . Almond Oil Rash  . Amlodipine Other (See Comments)    Fatigue lethargy at 5 mg dose  . Latex Rash  . Zetia [Ezetimibe] Other (See Comments)    GI cramps     Review of Systems:  No headache, visual changes, nausea, vomiting, diarrhea, constipation, dizziness, abdominal pain, skin rash, fevers, chills, night sweats, weight loss, swollen lymph nodes, body aches, joint swelling, chest pain, shortness of breath, mood changes. POSITIVE muscle aches  Objective  Blood pressure 130/90, pulse 67, height 5\' 5"  (1.651 m), weight 168 lb (76.2 kg), SpO2 96 %.   General: No apparent distress alert and oriented x3 mood and affect normal, dressed appropriately.  HEENT: Pupils equal, extraocular movements intact  Respiratory: Patient's speak in full sentences and does not appear short of breath  Cardiovascular: No lower extremity edema, non tender, no erythema  Neuro: Cranial nerves II through XII are intact, neurovascularly intact in all extremities with 2+ DTRs and 2+ pulses.  Gait normal with good balance and coordination.  MSK:  Non tender with full range of motion and good stability and symmetric strength and tone of shoulders, elbows, wrist, hip, knee and ankles bilaterally.  Back exam shows some more of the thoracolumbar tightness than previous exam.  Patient does have tightness with  FABER test bilaterally.  Negative straight leg test.  5 out of 5 strength of the lower extremities.  Osteopathic findings   T7 extended rotated and side bent left L1 flexed rotated and side bent right Sacrum right on right       Assessment and Plan: SI (sacroiliac) joint dysfunction Patient is making some improvement.  Still has some core strength to do.  Discussed posture and ergonomics.  Patient has the  gabapentin.  Patient had more pain in the thoracolumbar junction given some exercises for more scapular dyskinesis.  Discussed posture and ergonomics.  Increase activity slowly.  Follow-up again 4 to 8 weeks    Nonallopathic problems  Decision today to treat with OMT was based on Physical Exam  After verbal consent patient was treated with HVLA, ME, FPR techniques in  thoracic, lumbar, and sacral  areas  Patient tolerated the procedure well with improvement in symptoms  Patient given exercises, stretches and lifestyle modifications  See medications in patient instructions if given  Patient will follow up in 4-8 weeks      The above documentation has been reviewed and is accurate and complete Valerie Pulley, DO       Note: This dictation was prepared with Dragon dictation along with smaller phrase technology. Any transcriptional errors that result from this process are unintentional.

## 2020-09-28 ENCOUNTER — Encounter: Payer: Self-pay | Admitting: Family Medicine

## 2020-09-28 ENCOUNTER — Ambulatory Visit (INDEPENDENT_AMBULATORY_CARE_PROVIDER_SITE_OTHER): Payer: Medicare Other | Admitting: Family Medicine

## 2020-09-28 ENCOUNTER — Other Ambulatory Visit: Payer: Self-pay

## 2020-09-28 VITALS — BP 130/90 | HR 67 | Ht 65.0 in | Wt 168.0 lb

## 2020-09-28 DIAGNOSIS — M999 Biomechanical lesion, unspecified: Secondary | ICD-10-CM | POA: Diagnosis not present

## 2020-09-28 DIAGNOSIS — M533 Sacrococcygeal disorders, not elsewhere classified: Secondary | ICD-10-CM

## 2020-09-28 NOTE — Patient Instructions (Addendum)
Good to see you Yoga wheel Exercise 3 times a week See me again in 6-8 weeks

## 2020-09-28 NOTE — Assessment & Plan Note (Signed)
Patient is making some improvement.  Still has some core strength to do.  Discussed posture and ergonomics.  Patient has the gabapentin.  Patient had more pain in the thoracolumbar junction given some exercises for more scapular dyskinesis.  Discussed posture and ergonomics.  Increase activity slowly.  Follow-up again 4 to 8 weeks

## 2020-10-14 ENCOUNTER — Telehealth: Payer: Self-pay | Admitting: Internal Medicine

## 2020-10-14 NOTE — Telephone Encounter (Signed)
Left message for patient to call back and schedule Medicare Annual Wellness Visit (AWV) either virtually or in office.   Last AWV 02/27/16 ; please schedule at anytime with [facility] Nurse Health Advisor 1 or 2   This should be a 45 minute visit.

## 2020-10-22 ENCOUNTER — Other Ambulatory Visit: Payer: Self-pay | Admitting: Internal Medicine

## 2020-11-01 ENCOUNTER — Ambulatory Visit: Payer: Medicare Other | Admitting: Internal Medicine

## 2020-11-16 ENCOUNTER — Other Ambulatory Visit: Payer: Self-pay

## 2020-11-16 ENCOUNTER — Ambulatory Visit (INDEPENDENT_AMBULATORY_CARE_PROVIDER_SITE_OTHER): Payer: Medicare Other | Admitting: Family Medicine

## 2020-11-16 ENCOUNTER — Encounter: Payer: Self-pay | Admitting: Family Medicine

## 2020-11-16 VITALS — BP 130/80 | HR 63 | Ht 65.0 in | Wt 167.0 lb

## 2020-11-16 DIAGNOSIS — M533 Sacrococcygeal disorders, not elsewhere classified: Secondary | ICD-10-CM | POA: Diagnosis not present

## 2020-11-16 DIAGNOSIS — M999 Biomechanical lesion, unspecified: Secondary | ICD-10-CM

## 2020-11-16 NOTE — Progress Notes (Signed)
[facility] [address] [place] [address] Phone: [phone] Subjective:   [name], am serving as a scribe for Dr. Antoine Primas. This visit occurred during the SARS-CoV-2 public health emergency.  Safety protocols were in place, including screening questions prior to the visit, additional usage of staff PPE, and extensive cleaning of exam room while observing appropriate contact time as indicated for disinfecting solutions.   I'm seeing this patient by the request  of:  Panosh, Neta Mends, MD  CC: Neck and back pain follow-up  [id number]  Valerie Brady is a 75 y.o. female coming in with complaint of back and neck pain. OMT 09/28/2020. Patient states that her pain began yesterday. Has not been playing piano as much.  Patient states when she does play the piano has more of the upper neck pain. Low back has been doing relatively well.         Reviewed prior external information including notes and imaging from previsou exam, outside providers and external EMR if available.   As well as notes that were available from care everywhere and other healthcare systems.  Past medical history, social, surgical and family history all reviewed in electronic medical record.  No pertanent information unless stated regarding to the chief complaint.   Past Medical History:  Diagnosis Date  . Carotidynia   . Diverticulitis of colon 05/22/2015  . Gestational diabetes   . Hyperlipidemia   . Hypertension   . Hypothyroidism   . Syncope 2011   reaction to benicar    Allergies  Allergen Reactions  . Other Other (See Comments)  . Bee Venom Swelling  . Eggs Or Egg-Derived Products Nausea And Vomiting  . Enalapril Maleate     REACTION: Cough  . Enalapril Maleate Other (See Comments)    REACTION: Cough  . Lactose Diarrhea  . Lactose Intolerance (Gi) Diarrhea  . Olmesartan Other (See Comments)    REACTION: Gas and chest pain  . Olmesartan  Medoxomil     REACTION: Gas and chest pain  . Almond (Diagnostic) Rash  . Almond Oil Rash  . Amlodipine Other (See Comments)    Fatigue lethargy at 5 mg dose  . Latex Rash  . Zetia [Ezetimibe] Other (See Comments)    GI cramps     Review of Systems:  No headache, visual changes, nausea, vomiting, diarrhea, constipation, dizziness, abdominal pain, skin rash, fevers, chills, night sweats, weight loss, swollen lymph nodes, body aches, joint swelling, chest pain, shortness of breath, mood changes. POSITIVE muscle aches  Objective  Blood pressure 130/80, pulse 63, height 5\' 5"  (1.651 m), weight 167 lb (75.8 kg), SpO2 96 %.   General: No apparent distress alert and oriented x3 mood and affect normal, dressed appropriately.  HEENT: Pupils equal, extraocular movements intact  Respiratory: Patient's speak in full sentences and does not appear short of breath  Cardiovascular: No lower extremity edema, non tender, no erythema  Neuro: Cranial nerves II through XII are intact, neurovascularly intact in all extremities with 2+ DTRs and 2+ pulses.  Gait normal with good balance and coordination.  MSK:  Non tender with full range of motion and good stability and symmetric strength and tone of shoulders, elbows, wrist, hip, knee and ankles bilaterally.  Back -patient does have tightness more in the thoracolumbar juncture.  Mild tenderness to palpation in the parascapular region right greater than left. Low back mild tightness with straight leg test but no true radicular symptoms.  Near full range of motion.  Mild positive Corky Sox on the right side  Osteopathic findings  T9 extended rotated and side bent right  L2 flexed rotated and side bent right Sacrum right on right       Assessment and Plan:  SI (sacroiliac) joint dysfunction Patient overall is responding very well to conservative therapy. Very mild discomfort. Valerie Brady responding every 6 weeks to the manipulation. Has not needed any type of  pain medications. Patient does take gabapentin more for sleep and seems to be doing well. Follow-up with me again 6 to 8 weeks    Nonallopathic problems  Decision today to treat with OMT was based on Physical Exam  After verbal consent patient was treated with HVLA, ME, FPR techniques in  thoracic, lumbar, and sacral  areas  Patient tolerated the procedure well with improvement in symptoms  Patient given exercises, stretches and lifestyle modifications  See medications in patient instructions if given  Patient will follow up in 4-8 weeks      The above documentation has been reviewed and is accurate and complete Lyndal Pulley, DO       Note: This dictation was prepared with Dragon dictation along with smaller phrase technology. Any transcriptional errors that result from this process are unintentional.

## 2020-11-16 NOTE — Patient Instructions (Signed)
Good to see you See me again in 6-8 weeks 

## 2020-11-16 NOTE — Assessment & Plan Note (Signed)
Patient overall is responding very well to conservative therapy. Very mild discomfort. Valerie Brady responding every 6 weeks to the manipulation. Has not needed any type of pain medications. Patient does take gabapentin more for sleep and seems to be doing well. Follow-up with me again 6 to 8 weeks

## 2020-11-17 ENCOUNTER — Encounter: Payer: Self-pay | Admitting: Internal Medicine

## 2020-11-17 ENCOUNTER — Encounter: Payer: Self-pay | Admitting: Family Medicine

## 2020-11-17 DIAGNOSIS — Z1231 Encounter for screening mammogram for malignant neoplasm of breast: Secondary | ICD-10-CM | POA: Diagnosis not present

## 2020-11-18 ENCOUNTER — Other Ambulatory Visit: Payer: Self-pay

## 2020-11-20 NOTE — Progress Notes (Deleted)
No chief complaint on file.   HPI: Valerie Brady 75 y.o. come in for Chronic disease management  ROS: See pertinent positives and negatives per HPI.  Past Medical History:  Diagnosis Date  . Carotidynia   . Diverticulitis of colon 05/22/2015  . Gestational diabetes   . Hyperlipidemia   . Hypertension   . Hypothyroidism   . Syncope 2011   reaction to benicar    Family History  Problem Relation Age of Onset  . Hypertension Mother   . Dementia Mother   . Heart disease Father   . Dementia Father   . Diabetes Father   . Hypertension Brother     Social History   Socioeconomic History  . Marital status: Married    Spouse name: Not on file  . Number of children: Not on file  . Years of education: Not on file  . Highest education level: Not on file  Occupational History  . Occupation: retired physician    Comment: internist  Tobacco Use  . Smoking status: Never Smoker  . Smokeless tobacco: Never Used  Vaping Use  . Vaping Use: Never used  Substance and Sexual Activity  . Alcohol use: Yes    Alcohol/week: 0.0 standard drinks    Comment: occ wine  . Drug use: No  . Sexual activity: Never    Birth control/protection: Abstinence  Other Topics Concern  . 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 carvedilol (COREG) 12.5 MG tablet Take 1 tablet (12.5 mg total) by mouth 2 (two) times daily with a meal. 180 tablet 1  . cholecalciferol (VITAMIN D) 1000 units tablet Take 2,000 Units by mouth daily.     . Cyanocobalamin (B-12 PO)  Take 1 tablet by mouth daily.     Marland Kitchen estradiol (ESTRACE) 0.1 MG/GM vaginal cream INSERT 1ML (4 CLICKS) VAGINALLY 3 TIMES WEEKLY. 36 g 0  . gabapentin (NEURONTIN) 300 MG capsule TAKE (1) CAPSULE THREE TIMES DAILY,OR AS DIRECTED 90 capsule 1  . glucose blood (TRUE METRIX BLOOD GLUCOSE TEST) test strip CHECK BLOOD SUGAR ONCE A DAY.    Marland Kitchen LANCETS MICRO THIN 33G MISC Use to test blood glucose once daily. 100 each 3  . levothyroxine (SYNTHROID) 112 MCG tablet TAKE 1 TABLET ONCE DAILY. 90 tablet 0  . Loratadine 10 MG CAPS as needed.     . Melatonin 5 MG TABS Take 5 mg by mouth at bedtime.    . metFORMIN (GLUCOPHAGE-XR) 500 MG 24 hr tablet TAKE 1 TABLET BY MOUTH TWICE DAILY WITH A MEAL. 180 tablet 3  . NONFORMULARY OR COMPOUNDED ITEM Estriol 0.75mg /ml Cream: Insert 1 vaginally three times weekly 36 each 1  . Probiotic Product (ALIGN PO) Take by mouth.    . sulfamethoxazole-trimethoprim (BACTRIM DS) 800-160 MG tablet Take 1 tablet by mouth 2 (two) times daily. 10 tablet 0  . TRUE METRIX BLOOD GLUCOSE TEST test strip CHECK BLOOD SUGAR ONCE A DAY. [place]  strip 3   No facility-administered medications prior to visit.     EXAM:  There were no vitals taken for this visit.  There is no height or weight on file to calculate BMI.  GENERAL: vitals reviewed and listed above, alert, oriented, appears well hydrated and in no acute distress HEENT: atraumatic, conjunctiva  clear, no obvious abnormalities on inspection of external nose and ears OP : no lesion edema or exudate  NECK: no obvious masses on inspection palpation  LUNGS: clear to auscultation bilaterally, no wheezes, rales or rhonchi, good air movement CV: HRRR, no clubbing cyanosis or  peripheral edema nl cap refill  MS: moves all extremities without noticeable focal  abnormality PSYCH: pleasant and cooperative, no obvious depression or anxiety Lab Results  Component Value Date   WBC 8.7 10/14/2019   HGB 13.2 10/14/2019   HCT 39.3 10/14/2019   PLT  354.0 10/14/2019   GLUCOSE 112 (H) 10/14/2019   CHOL 273 (H) 10/14/2019   TRIG 244.0 (H) 10/14/2019   HDL 42.60 10/14/2019   LDLDIRECT 179.0 10/14/2019   LDLCALC 174 (H) 02/27/2016   ALT 10 10/14/2019   AST 12 10/14/2019   NA 139 10/14/2019   K 4.1 10/14/2019   CL 103 10/14/2019   CREATININE 0.76 10/14/2019   BUN 14 10/14/2019   CO2 28 10/14/2019   TSH 1.91 10/14/2019   HGBA1C 6.2 (A) 05/02/2020   MICROALBUR 8.5 09/13/2020   BP Readings from Last 3 Encounters:  11/16/20 130/80  09/28/20 130/90  08/31/20 134/82    ASSESSMENT AND PLAN:  Discussed the following assessment and plan:  Hypothyroidism, unspecified type  Essential hypertension  Hyperlipidemia associated with type 2 diabetes mellitus ([place])  Type 2 diabetes mellitus without complication, without long-term current use of insulin ([place])  Statin intolerance Over due labs  All  -Patient advised to return or notify health care team  if  new concerns arise.  There are no Patient Instructions on file for this visit.   Standley Brooking. Rosaura Bolon M.D.

## 2020-11-21 ENCOUNTER — Ambulatory Visit: Payer: Medicare Other | Admitting: Internal Medicine

## 2020-11-21 DIAGNOSIS — Z789 Other specified health status: Secondary | ICD-10-CM

## 2020-11-21 DIAGNOSIS — E119 Type 2 diabetes mellitus without complications: Secondary | ICD-10-CM

## 2020-11-21 DIAGNOSIS — I10 Essential (primary) hypertension: Secondary | ICD-10-CM

## 2020-11-21 DIAGNOSIS — E1169 Type 2 diabetes mellitus with other specified complication: Secondary | ICD-10-CM

## 2020-11-21 DIAGNOSIS — Z1152 Encounter for screening for COVID-19: Secondary | ICD-10-CM | POA: Diagnosis not present

## 2020-11-21 DIAGNOSIS — E039 Hypothyroidism, unspecified: Secondary | ICD-10-CM

## 2020-11-25 ENCOUNTER — Other Ambulatory Visit: Payer: Self-pay | Admitting: Internal Medicine

## 2020-11-28 ENCOUNTER — Other Ambulatory Visit: Payer: Self-pay | Admitting: Internal Medicine

## 2020-12-06 ENCOUNTER — Other Ambulatory Visit: Payer: Self-pay | Admitting: Internal Medicine

## 2020-12-26 ENCOUNTER — Other Ambulatory Visit: Payer: Self-pay

## 2020-12-26 ENCOUNTER — Ambulatory Visit (INDEPENDENT_AMBULATORY_CARE_PROVIDER_SITE_OTHER): Payer: Medicare Other | Admitting: Internal Medicine

## 2020-12-26 ENCOUNTER — Encounter: Payer: Self-pay | Admitting: Internal Medicine

## 2020-12-26 VITALS — BP 130/70 | HR 75 | Temp 98.1°F | Ht 65.0 in | Wt 169.8 lb

## 2020-12-26 DIAGNOSIS — E1169 Type 2 diabetes mellitus with other specified complication: Secondary | ICD-10-CM | POA: Diagnosis not present

## 2020-12-26 DIAGNOSIS — I10 Essential (primary) hypertension: Secondary | ICD-10-CM | POA: Diagnosis not present

## 2020-12-26 DIAGNOSIS — Z79899 Other long term (current) drug therapy: Secondary | ICD-10-CM | POA: Diagnosis not present

## 2020-12-26 DIAGNOSIS — E039 Hypothyroidism, unspecified: Secondary | ICD-10-CM | POA: Diagnosis not present

## 2020-12-26 DIAGNOSIS — E119 Type 2 diabetes mellitus without complications: Secondary | ICD-10-CM | POA: Diagnosis not present

## 2020-12-26 DIAGNOSIS — E785 Hyperlipidemia, unspecified: Secondary | ICD-10-CM | POA: Diagnosis not present

## 2020-12-26 DIAGNOSIS — Z87440 Personal history of urinary (tract) infections: Secondary | ICD-10-CM | POA: Diagnosis not present

## 2020-12-26 MED ORDER — NONFORMULARY OR COMPOUNDED ITEM: 99 refills | Status: DC

## 2020-12-26 NOTE — Patient Instructions (Signed)
Get fasting labs    BP goals  Below 130   /80 prefer 120/80.  consider dexa in future if you wish .   Then 6 mos  FU  For a1c depending .

## 2020-12-26 NOTE — Progress Notes (Signed)
Chief Complaint  Patient presents with  . Follow-up  . Medication refill    Estrogen Cream    HPI: Valerie Brady 75 y.o. come in for Chronic disease management   Dm:   BG  A little higher and then  Better  So far     VVO:HYWVP intolerant   THYroid : same    Vision ok   Dr Katy Fitch .   Hx of uti  Recurrent  Controlled   Compounded estradiol less expensive   At [place]  Helping  And some  cranberry   Carotidynia  Right . Take gabapentin 300 sometimes up to 600 mg   Sleep:  6-8 . hours  Using pt exercising   BP usually 130 range at home   ROS: See pertinent positives  and negatives per HPI.  Past Medical History:  Diagnosis Date  . Carotidynia   . Diverticulitis of colon 05/22/2015  . Gestational diabetes   . Hyperlipidemia   . Hypertension   . Hypothyroidism   . Syncope 2011   reaction to benicar    Family History  Problem Relation Age of Onset  . Hypertension Mother   . Dementia Mother   . Heart disease Father   . Dementia Father   . Diabetes Father   . Hypertension Brother     Social History   Socioeconomic History  . Marital status: Married    Spouse name: Not on file  . Number of children: Not on file  . Years of education: Not on file  . Highest education level: Not on file  Occupational History  . Occupation: retired physician    Comment: internist  Tobacco Use  . Smoking status: Never Smoker  . Smokeless tobacco: Never Used  Vaping Use  . Vaping Use: Never used  Substance and Sexual Activity  . Alcohol use: Yes    Alcohol/week: 0.0 standard drinks    Comment: occ wine  . Drug use: No  . Sexual activity: Never    Birth control/protection: Abstinence  Other Topics Concern  . 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 carvedilol (COREG) 12.5 MG tablet TAKE 1 TABLET BY MOUTH TWICE DAILY WITH A MEAL. 180 tablet 0  . cholecalciferol (VITAMIN D) 1000 units tablet Take 2,000 Units by mouth daily.     . Cyanocobalamin (B-12 PO) Take 1 tablet by mouth daily.     Marland Kitchen estradiol (ESTRACE) 0.1 MG/GM vaginal cream INSERT 1ML (4 CLICKS) VAGINALLY 3 TIMES WEEKLY. 36 g 0  . gabapentin (NEURONTIN) 300 MG capsule TAKE (1) CAPSULE THREE TIMES DAILY,OR AS DIRECTED 90 capsule 0  . glucose blood (TRUE METRIX BLOOD GLUCOSE TEST) test strip CHECK BLOOD SUGAR ONCE A DAY.    Marland Kitchen LANCETS MICRO THIN 33G MISC Use to test blood glucose once daily. 100 each 3  . levothyroxine (SYNTHROID) 112 MCG tablet TAKE 1 TABLET ONCE DAILY. 90 tablet 0  . Loratadine 10  MG CAPS as needed.     . Melatonin 5 MG TABS Take 5 mg by mouth at bedtime.    . metFORMIN (GLUCOPHAGE-XR) 500 MG 24 hr tablet TAKE 1 TABLET BY MOUTH TWICE DAILY WITH A MEAL. 180 tablet 1  . Probiotic Product (ALIGN PO) Take by mouth.    . sulfamethoxazole-trimethoprim (BACTRIM DS) 800-160 MG tablet Take 1 tablet by mouth 2 (two) times daily. 10 tablet 0  . TRUE METRIX BLOOD GLUCOSE TEST test strip CHECK BLOOD SUGAR ONCE A DAY. 50 strip 3  . NONFORMULARY OR COMPOUNDED ITEM Estriol 0.75mg /ml Cream: Insert 1 vaginally three times weekly 36 each 1   No facility-administered medications prior to visit.     EXAM:  BP (!) 148/78 (BP Location: Left Arm, Patient Position: Sitting)   Pulse 75   Temp 98.1 F (36.7 C)   Ht 5\' 5"  (1.651 m)   Wt 169 lb 12.8 oz (77 kg)   SpO2 98%   BMI 28.26 kg/m   Body mass index is 28.26 kg/m.  GENERAL: vitals reviewed and listed above, alert, oriented, appears well hydrated and in no acute distress HEENT: atraumatic,  conjunctiva  clear, no obvious abnormalities on inspection of external nose and ears OP : masked  NECK: no obvious masses on inspection palpation  LUNGS: clear to auscultation bilaterally, no wheezes, rales or rhonchi, good air movement CV: HRRR, no clubbing cyanosis or  peripheral edema nl cap refill  Abdomen:  Sof,t normal bowel sounds without hepatosplenomegaly, no guarding rebound or masses  MS: moves all extremities without noticeable focal  abnormality PSYCH: pleasant and cooperative, no obvious depression or anxiety Lab Results  Component Value Date   WBC 8.7 10/14/2019   HGB 13.2 10/14/2019   HCT 39.3 10/14/2019   PLT 354.0 10/14/2019   GLUCOSE 112 (H) 10/14/2019   CHOL 273 (H) 10/14/2019   TRIG 244.0 (H) 10/14/2019   HDL 42.60 10/14/2019   LDLDIRECT 179.0 10/14/2019   LDLCALC 174 (H) 02/27/2016   ALT 10 10/14/2019   AST 12 10/14/2019   NA 139 10/14/2019   K 4.1 10/14/2019   CL 103 10/14/2019   CREATININE 0.76 10/14/2019   BUN 14 10/14/2019   CO2 28 10/14/2019   TSH 1.91 10/14/2019   HGBA1C 6.2 (A) 05/02/2020   MICROALBUR 8.5 09/13/2020   BP Readings from Last 3 Encounters:  12/26/20 (!) 148/78  11/16/20 130/80  09/28/20 130/90    ASSESSMENT AND PLAN:  Discussed the following assessment and plan:  Essential hypertension - Plan: Basic metabolic panel, CBC with Differential/Platelet, Hemoglobin A1c, Hepatic function panel, Lipid panel, TSH, Microalbumin / creatinine urine ratio  Medication management - Plan: Basic metabolic panel, CBC with Differential/Platelet, Hemoglobin A1c, Hepatic function panel, Lipid panel, TSH, Microalbumin / creatinine urine ratio  Hyperlipidemia associated with type 2 diabetes mellitus ([place]) - Plan: Basic metabolic panel, CBC with Differential/Platelet, Hemoglobin A1c, Hepatic function panel, Lipid panel, TSH, Microalbumin / creatinine urine ratio  Hypothyroidism, unspecified type - Plan: Basic metabolic panel, CBC with  Differential/Platelet, Hemoglobin A1c, Hepatic function panel, Lipid panel, TSH, Microalbumin / creatinine urine ratio  Type 2 diabetes mellitus without complication, without long-term current use of insulin ([facility]) - Plan: Basic metabolic panel, CBC with Differential/Platelet, Hemoglobin A1c, Hepatic function panel, Lipid panel, TSH, Microalbumin / creatinine urine ratio  History of recurrent UTIs Updated labs   plan fasting  Ensure that BP  Is  Controlled at home . Decline dexa not high risk  Continue exercise  Training  Topical estrogen to continue -Patient advised to return or notify health care team  if  new concerns arise.  Patient Instructions  Get fasting labs    BP goals  Below 130   /80 prefer 120/80.  consider dexa in future if you wish .   Then 6 mos  FU  For a1c depending .   Standley Brooking. Munachimso Rigdon M.D.

## 2020-12-27 NOTE — Progress Notes (Signed)
[place] Phone: [phone] Subjective:   Valerie Valerie Brady, am serving as a scribe for Dr. Hulan Saas. This visit occurred during the SARS-CoV-2 public health emergency.  Safety protocols were in place, including screening questions prior to the visit, additional usage of staff PPE, and extensive cleaning of exam room while observing appropriate contact time as indicated for disinfecting solutions.   I'm seeing this patient by the request  of:  Panosh, Standley Brooking, MD  CC: back pain follow up   [id number]  Valerie Valerie Brady is a 75 y.o. female coming in with complaint of back and neck pain. OMT 11/16/2020.  Patient states that she is doing physical therapy 2x a week. Does feel improvement with these exercises. Placed a towel rolled up under her ishial tuberosities and this has helped put in her in a better position to play piano which has reduced her back pain.   Medications patient has been prescribed: None         Reviewed prior external information including notes and imaging from previsou exam, outside providers and external EMR if available.   As well as notes that were available from care everywhere and other healthcare systems.  Past medical history, social, surgical and family history all reviewed in electronic medical record.  Valerie Brady pertanent information unless stated regarding to the chief complaint.   Past Medical History:  Diagnosis Date  . Carotidynia   . Diverticulitis of colon 05/22/2015  . Gestational diabetes   . Hyperlipidemia   . Hypertension   . Hypothyroidism   . Syncope 2011   reaction to benicar    Allergies  Allergen Reactions  . Other Other (See Comments)  . Bee Venom Swelling  . Eggs Or Egg-Derived Products Nausea And Vomiting  . Enalapril Maleate     REACTION: Cough  . Enalapril Maleate Other (See Comments)    REACTION: Cough  . Lactose Diarrhea  . Lactose Intolerance  (Gi) Diarrhea  . Olmesartan Other (See Comments)    REACTION: Gas and chest pain  . Olmesartan Medoxomil     REACTION: Gas and chest pain  . Almond (Diagnostic) Rash  . Almond Oil Rash  . Amlodipine Other (See Comments)    Fatigue lethargy at 5 mg dose  . Latex Rash  . Zetia [Ezetimibe] Other (See Comments)    GI cramps     Review of Systems:  Valerie Brady headache, visual changes, nausea, vomiting, diarrhea, constipation, dizziness, abdominal pain, skin rash, fevers, chills, night sweats, weight loss, swollen lymph nodes, body aches, joint swelling, chest pain, shortness of breath, mood changes. POSITIVE muscle aches mild  Objective  Blood pressure 132/76, pulse 66, height 5\' 5"  (1.651 m), weight 168 lb (76.2 kg), SpO2 97 %.   General: Valerie Brady apparent distress alert and oriented x3 mood and affect normal, dressed appropriately.  HEENT: Pupils equal, extraocular movements intact  Respiratory: Patient's speak in full sentences and does not appear short of breath  Cardiovascular: Valerie Brady lower extremity edema, non tender, Valerie Brady erythema  Neuro: Cranial nerves II through XII are intact, neurovascularly intact in all extremities with 2+ DTRs and 2+ pulses.  Gait normal with good balance and coordination.  MSK:  Non tender with full range of motion and good stability and symmetric strength and tone of shoulders, elbows, wrist, hip, knee and ankles bilaterally.  Back - continued tightness in the TL juncture musculature right greater than left  Positive right FABER  Neg SLT  Osteopathic findings   T9 extended rotated and side bent left L2 flexed rotated and side bent right Sacrum right on right       Assessment and Plan:  SI (sacroiliac) joint dysfunction Continues to have some pain over the sacroiliac joint.  Is responding very well though to be conservative therapy.  Working with a physical therapist and responding to osteopathic manipulation.  Patient does have gabapentin but is not taking  anything else significantly for pain relief at this time.  Increase activity slowly.  Follow-up again 6 weeks    Nonallopathic problems  Decision today to treat with OMT was based on Physical Exam  After verbal consent patient was treated with HVLA, ME, FPR techniques in thoracic, lumbar, and sacral  areas  Patient tolerated the procedure well with improvement in symptoms  Patient given exercises, stretches and lifestyle modifications  See medications in patient instructions if given  Patient will follow up in 6 weeks      The above documentation has been reviewed and is accurate and complete Lyndal Pulley, DO       Note: This dictation was prepared with Dragon dictation along with smaller phrase technology. Any transcriptional errors that result from this process are unintentional.

## 2020-12-28 ENCOUNTER — Encounter: Payer: Self-pay | Admitting: Family Medicine

## 2020-12-28 ENCOUNTER — Other Ambulatory Visit: Payer: Medicare Other

## 2020-12-28 ENCOUNTER — Other Ambulatory Visit: Payer: Self-pay

## 2020-12-28 ENCOUNTER — Ambulatory Visit (INDEPENDENT_AMBULATORY_CARE_PROVIDER_SITE_OTHER): Payer: Medicare Other | Admitting: Family Medicine

## 2020-12-28 VITALS — BP 132/76 | HR 66 | Ht 65.0 in | Wt 168.0 lb

## 2020-12-28 DIAGNOSIS — M999 Biomechanical lesion, unspecified: Secondary | ICD-10-CM

## 2020-12-28 DIAGNOSIS — M533 Sacrococcygeal disorders, not elsewhere classified: Secondary | ICD-10-CM | POA: Diagnosis not present

## 2020-12-28 NOTE — Assessment & Plan Note (Signed)
Continues to have some pain over the sacroiliac joint.  Is responding very well though to be conservative therapy.  Working with a physical therapist and responding to osteopathic manipulation.  Patient does have gabapentin but is not taking anything else significantly for pain relief at this time.  Increase activity slowly.  Follow-up again 6 weeks

## 2020-12-28 NOTE — Patient Instructions (Signed)
See me in 6 weeks Good to see you today!

## 2021-01-10 ENCOUNTER — Encounter: Payer: Self-pay | Admitting: Internal Medicine

## 2021-01-12 ENCOUNTER — Telehealth: Payer: Self-pay | Admitting: Internal Medicine

## 2021-01-12 NOTE — Telephone Encounter (Signed)
Left message for patient to call back and schedule Medicare Annual Wellness Visit (AWV) either virtually or in office. No detailed message  Last AWV  02/27/16  please schedule at anytime with [facility] Nurse Health Advisor 1 or 2   This should be a 45 minute visit.

## 2021-01-30 ENCOUNTER — Other Ambulatory Visit: Payer: Self-pay | Admitting: Internal Medicine

## 2021-02-07 NOTE — Progress Notes (Signed)
[place] [address] [place] Phone: [phone] Subjective:   I Valerie Brady am serving as a Education administrator for Dr. Hulan Saas.  This visit occurred during the SARS-CoV-2 public health emergency.  Safety protocols were in place, including screening questions prior to the visit, additional usage of staff PPE, and extensive cleaning of exam room while observing appropriate contact time as indicated for disinfecting solutions.   I'm seeing this patient by the request  of:  Panosh, Standley Brooking, MD  CC: Low back pain,  [id number]  Valerie Brady is a 75 y.o. female coming in with complaint of back and neck pain. OMT 12/28/2020. Patient states she is doing well. States she is making progress. SI joint pain on the right at times but the neck is doing well.  Patient has been fairly active at the moment.  Patient denies any worsening pain.  Just has a pain in the sacroiliac joint intermittently.  Medications patient has been prescribed: none           Reviewed prior external information including notes and imaging from previsou exam, outside providers and external EMR if available.   As well as notes that were available from care everywhere and other healthcare systems.  Past medical history, social, surgical and family history all reviewed in electronic medical record.  No pertanent information unless stated regarding to the chief complaint.   Past Medical History:  Diagnosis Date  . Carotidynia   . Diverticulitis of colon 05/22/2015  . Gestational diabetes   . Hyperlipidemia   . Hypertension   . Hypothyroidism   . Syncope 2011   reaction to benicar    Allergies  Allergen Reactions  . Other Other (See Comments)  . Bee Venom Swelling  . Eggs Or Egg-Derived Products Nausea And Vomiting  . Enalapril Maleate     REACTION: Cough  . Enalapril Maleate Other (See Comments)    REACTION: Cough  . Lactose Diarrhea  . Lactose  Intolerance (Gi) Diarrhea  . Olmesartan Other (See Comments)    REACTION: Gas and chest pain  . Olmesartan Medoxomil     REACTION: Gas and chest pain  . Almond (Diagnostic) Rash  . Almond Oil Rash  . Amlodipine Other (See Comments)    Fatigue lethargy at 5 mg dose  . Latex Rash  . Zetia [Ezetimibe] Other (See Comments)    GI cramps     Review of Systems:  No headache, visual changes, nausea, vomiting, diarrhea, constipation, dizziness, abdominal pain, skin rash, fevers, chills, night sweats, weight loss, swollen lymph nodes, body aches, joint swelling, chest pain, shortness of breath, mood changes. POSITIVE muscle aches but mild and improving  Objective  Blood pressure 122/84, pulse 71, height 5\' 5"  (1.651 m), weight 169 lb (76.7 kg), SpO2 97 %.   General: No apparent distress alert and oriented x3 mood and affect normal, dressed appropriately.  HEENT: Pupils equal, extraocular movements intact  Respiratory: Patient's speak in full sentences and does not appear short of breath  Cardiovascular: No lower extremity edema, non tender, no erythema  Gait normal with good balance and coordination.  MSK: Mild arthritic changes of multiple joints Back low back exam continues to have some tightness noted more in the thoracolumbar juncture seems to be more right greater than left.  Mild positive FABER test noted.  Negative straight leg test.  Neurovascularly intact distally.  Osteopathic findings  T9 extended rotated and side bent left L2 flexed rotated  and side bent right Sacrum right on right       Assessment and Plan:  SI (sacroiliac) joint dysfunction Continues to make improvement.  Discussed posture and ergonomics, discussed which activities to do which wants to avoid.  Patient is to follow-up with me again in 2 months    Nonallopathic problems  Decision today to treat with OMT was based on Physical Exam  After verbal consent patient was treated with HVLA, ME, FPR  techniques in thoracic, lumbar, and sacral  areas  Patient tolerated the procedure well with improvement in symptoms  Patient given exercises, stretches and lifestyle modifications  See medications in patient instructions if given  Patient will follow up in 6-8 weeks      The above documentation has been reviewed and is accurate and complete Lyndal Pulley, DO       Note: This dictation was prepared with Dragon dictation along with smaller phrase technology. Any transcriptional errors that result from this process are unintentional.

## 2021-02-08 ENCOUNTER — Encounter: Payer: Self-pay | Admitting: Family Medicine

## 2021-02-08 ENCOUNTER — Ambulatory Visit (INDEPENDENT_AMBULATORY_CARE_PROVIDER_SITE_OTHER): Payer: Medicare Other | Admitting: Family Medicine

## 2021-02-08 ENCOUNTER — Other Ambulatory Visit: Payer: Self-pay

## 2021-02-08 VITALS — BP 122/84 | HR 71 | Ht 65.0 in | Wt 169.0 lb

## 2021-02-08 DIAGNOSIS — M533 Sacrococcygeal disorders, not elsewhere classified: Secondary | ICD-10-CM | POA: Diagnosis not present

## 2021-02-08 DIAGNOSIS — M999 Biomechanical lesion, unspecified: Secondary | ICD-10-CM

## 2021-02-08 NOTE — Assessment & Plan Note (Signed)
Continues to make improvement.  Discussed posture and ergonomics, discussed which activities to do which wants to avoid.  Patient is to follow-up with me again in 2 months

## 2021-02-08 NOTE — Patient Instructions (Signed)
Good to see you Hope your brother does well Keep doing what you are doing See me again in 2 months

## 2021-02-16 DIAGNOSIS — L719 Rosacea, unspecified: Secondary | ICD-10-CM | POA: Diagnosis not present

## 2021-02-16 DIAGNOSIS — Z85828 Personal history of other malignant neoplasm of skin: Secondary | ICD-10-CM | POA: Diagnosis not present

## 2021-02-16 DIAGNOSIS — L578 Other skin changes due to chronic exposure to nonionizing radiation: Secondary | ICD-10-CM | POA: Diagnosis not present

## 2021-02-16 DIAGNOSIS — D225 Melanocytic nevi of trunk: Secondary | ICD-10-CM | POA: Diagnosis not present

## 2021-02-16 DIAGNOSIS — D2239 Melanocytic nevi of other parts of face: Secondary | ICD-10-CM | POA: Diagnosis not present

## 2021-02-16 DIAGNOSIS — L821 Other seborrheic keratosis: Secondary | ICD-10-CM | POA: Diagnosis not present

## 2021-02-21 ENCOUNTER — Ambulatory Visit: Payer: Medicare Other | Admitting: Internal Medicine

## 2021-03-01 ENCOUNTER — Other Ambulatory Visit: Payer: Self-pay | Admitting: Internal Medicine

## 2021-03-27 ENCOUNTER — Telehealth: Payer: Self-pay | Admitting: Internal Medicine

## 2021-03-27 NOTE — Telephone Encounter (Signed)
Patient is calling and requesting a refill for NONFORMULARY OR COMPOUNDED ITEM Estriol 0.75mg / ml cream to be sent   [place], [place], [place] [address]  Phone:  [phone] Fax:  [phone] CB is [phone]

## 2021-03-28 ENCOUNTER — Ambulatory Visit: Payer: Medicare Other

## 2021-03-28 MED ORDER — NONFORMULARY OR COMPOUNDED ITEM: 0 refills | Status: DC

## 2021-03-28 NOTE — Telephone Encounter (Signed)
Call in a 30 day supply

## 2021-03-28 NOTE — Telephone Encounter (Signed)
Spoke with the pharmacist [place] at [facility].  She stated the pt had a previous Rx for Estriol 0.75mg  cream-#22ml-insert 40ml vaginally 3 times a week.  Rx was called in to [facility] as this is a non-formulary medication.

## 2021-04-04 ENCOUNTER — Telehealth (INDEPENDENT_AMBULATORY_CARE_PROVIDER_SITE_OTHER): Payer: Medicare Other | Admitting: Adult Health

## 2021-04-04 ENCOUNTER — Encounter: Payer: Self-pay | Admitting: Adult Health

## 2021-04-04 VITALS — HR 82 | Temp 97.0°F | Ht 65.0 in | Wt 164.0 lb

## 2021-04-04 DIAGNOSIS — B349 Viral infection, unspecified: Secondary | ICD-10-CM

## 2021-04-04 NOTE — Telephone Encounter (Signed)
I spoke with the patient and a virtual visit was scheduled for 05/24.

## 2021-04-04 NOTE — Progress Notes (Signed)
Virtual Visit via Video Note  I connected with@ Valerie Brady on 04/04/21 at  9:30 AM EDT by a video enabled telemedicine application and verified that I am speaking with the correct person using two identifiers.  Location patient: home Location provider:work or home office Persons participating in the virtual visit: patient, provider  I discussed the limitations of evaluation and management by telemedicine and the availability of in person appointments. The patient expressed understanding and agreed to proceed.   HPI: 75 year old female who is being evaluated today for concern of COVID-19 infection.  She reports that her symptoms started Friday, May 13 with myalgia and fatigue.  At this time she tested negative for COVID-19 with an at home test.  As the days went on she developed a low-grade fever and nonproductive cough.  On 03/31/2021 her daughter tested positive for COVID-19, but she has not retested.  Reports that over the weekend she started to feel better but continued to have a cough and myalgia ( fever has resolved).  She used some cough syrup with codeine and had an episode of vertigo and a few episodes of vomiting a few hours later.  She took 5 mg of Valium p.o. and vertigo has resolved and no longer having nausea.  Continues to have dry cough and myalgia.   ROS: See pertinent positives and negatives per HPI.  Past Medical History:  Diagnosis Date  . Carotidynia   . Diverticulitis of colon 05/22/2015  . Gestational diabetes   . Hyperlipidemia   . Hypertension   . Hypothyroidism   . Syncope 2011   reaction to benicar    Past Surgical History:  Procedure Laterality Date  . cervical polyp removed    . FIBULA FRACTURE SURGERY     OPEN REDUCTION AND PINNING OF SPIRAL FX LEFT FIBULA  . TONSILLECTOMY    . TUBAL LIGATION      Family History  Problem Relation Age of Onset  . Hypertension Mother   . Dementia Mother   . Heart disease Father   . Dementia Father   . Diabetes  Father   . Hypertension Brother        Current Outpatient Medications:  .  carvedilol (COREG) 12.5 MG tablet, TAKE 1 TABLET BY MOUTH TWICE DAILY WITH A MEAL., Disp: 180 tablet, Rfl: 1 .  cholecalciferol (VITAMIN D) 1000 units tablet, Take 2,000 Units by mouth daily. , Disp: , Rfl:  .  Cranberry-Vitamin C-Vitamin E (CRANBERRY PLUS VITAMIN C) 4200-20-3 MG-MG-UNIT CAPS, Take by mouth., Disp: , Rfl:  .  Cyanocobalamin (B-12 PO), Take 1 tablet by mouth daily. , Disp: , Rfl:  .  glucose blood (TRUE METRIX BLOOD GLUCOSE TEST) test strip, CHECK BLOOD SUGAR ONCE A DAY., Disp: , Rfl:  .  LANCETS MICRO THIN 33G MISC, Use to test blood glucose once daily., Disp: 100 each, Rfl: 3 .  levothyroxine (SYNTHROID) 112 MCG tablet, TAKE 1 TABLET ONCE DAILY., Disp: 90 tablet, Rfl: 0 .  Melatonin 5 MG TABS, Take 5 mg by mouth at bedtime., Disp: , Rfl:  .  metFORMIN (GLUCOPHAGE-XR) 500 MG 24 hr tablet, TAKE 1 TABLET BY MOUTH TWICE DAILY WITH A MEAL., Disp: 180 tablet, Rfl: 1 .  NONFORMULARY OR COMPOUNDED ITEM, Estriol 0.75mg /ml Cream: Insert 53ml vaginally three times weekly, Disp: 36 each, Rfl: 0 .  estradiol (ESTRACE) 0.1 MG/GM vaginal cream, INSERT 1ML (4 CLICKS) VAGINALLY 3 TIMES WEEKLY. (Patient not taking: Reported on 04/04/2021), Disp: 36 g, Rfl: 0 .  gabapentin (NEURONTIN) 300 MG capsule, TAKE (1) CAPSULE THREE TIMES DAILY,OR AS DIRECTED (Patient not taking: Reported on 04/04/2021), Disp: 90 capsule, Rfl: 0 .  Loratadine 10 MG CAPS, as needed.  (Patient not taking: Reported on 04/04/2021), Disp: , Rfl:  .  Probiotic Product (ALIGN PO), Take by mouth. (Patient not taking: Reported on 04/04/2021), Disp: , Rfl:  .  sulfamethoxazole-trimethoprim (BACTRIM DS) 800-160 MG tablet, Take 1 tablet by mouth 2 (two) times daily. (Patient not taking: Reported on 04/04/2021), Disp: 10 tablet, Rfl: 0 .  TRUE METRIX BLOOD GLUCOSE TEST test strip, CHECK BLOOD SUGAR ONCE A DAY., Disp: 50 strip, Rfl: 3  EXAM:  VITALS per patient  if applicable:  GENERAL: alert, oriented, appears well and in no acute distress  HEENT: atraumatic, conjunttiva clear, no obvious abnormalities on inspection of external nose and ears  NECK: normal movements of the head and neck  LUNGS: on inspection no signs of respiratory distress, breathing rate appears normal, no obvious gross SOB, gasping or wheezing  CV: no obvious cyanosis  MS: moves all visible extremities without noticeable abnormality  PSYCH/NEURO: pleasant and cooperative, no obvious depression or anxiety, speech and thought processing grossly intact  ASSESSMENT AND PLAN:  Discussed the following assessment and plan:  1. Viral illness -Likely recovering from COVID-19 infection.  She is outside the window for antiviral medications.  Since she is not feeling very acutely ill and has improved we will hold off on any treatment.  Vertigo and vomiting could have been from codeine cough medicine on an empty stomach.  She will follow-up if symptoms do not improve, could always try oral prednisone to help with cough/post viral bronchitis.    I discussed the assessment and treatment plan with the patient. The patient was provided an opportunity to ask questions and all were answered. The patient agreed with the plan and demonstrated an understanding of the instructions.   The patient was advised to call back or seek an in-person evaluation if the symptoms worsen or if the condition fails to improve as anticipated.   [name], NP

## 2021-04-05 ENCOUNTER — Other Ambulatory Visit: Payer: Self-pay | Admitting: Adult Health

## 2021-04-05 ENCOUNTER — Encounter: Payer: Self-pay | Admitting: Adult Health

## 2021-04-05 ENCOUNTER — Ambulatory Visit: Payer: Medicare Other

## 2021-04-05 MED ORDER — ONDANSETRON HCL 4 MG PO TABS: 4.0000 mg | ORAL_TABLET | Freq: Three times a day (TID) | ORAL | 1 refills | Status: DC | PRN

## 2021-04-07 ENCOUNTER — Ambulatory Visit: Payer: Medicare Other | Admitting: Family Medicine

## 2021-04-11 ENCOUNTER — Other Ambulatory Visit (HOSPITAL_COMMUNITY): Payer: Self-pay

## 2021-04-12 ENCOUNTER — Telehealth: Payer: Self-pay | Admitting: Internal Medicine

## 2021-04-12 NOTE — Telephone Encounter (Signed)
Left message for patient to call back and schedule Medicare Annual Wellness Visit (AWV) either virtually or in office.   AWV-I PER [facility] 07/14/15  please schedule at anytime with [facility] Nurse Health Advisor 1 or 2   This should be a 45 minute visit.

## 2021-04-25 ENCOUNTER — Telehealth: Payer: Self-pay | Admitting: Internal Medicine

## 2021-04-25 NOTE — Telephone Encounter (Signed)
Pt call and stated she don't want a appt  for a awv.

## 2021-04-26 NOTE — Telephone Encounter (Signed)
Documented on spreadsheet 

## 2021-04-26 NOTE — Telephone Encounter (Signed)
Declined per phone note 04/25/21

## 2021-04-27 ENCOUNTER — Other Ambulatory Visit: Payer: Self-pay | Admitting: Internal Medicine

## 2021-05-17 ENCOUNTER — Ambulatory Visit: Payer: Medicare Other | Admitting: Family Medicine

## 2021-05-17 NOTE — Progress Notes (Deleted)
[facility] Phone: [phone] Subjective:    I'm seeing this patient by the request  of:  Panosh, Standley Brooking, MD  CC:   [id number]  Valerie Brady is a 75 y.o. female coming in with complaint of back and neck pain Patient states   Medications patient has been prescribed:   Taking:         Reviewed prior external information including notes and imaging from previsou exam, outside providers and external EMR if available.   As well as notes that were available from care everywhere and other healthcare systems.  Past medical history, social, surgical and family history all reviewed in electronic medical record.  No pertanent information unless stated regarding to the chief complaint.   Past Medical History:  Diagnosis Date   Carotidynia    Diverticulitis of colon 05/22/2015   Gestational diabetes    Hyperlipidemia    Hypertension    Hypothyroidism    Syncope 2011   reaction to benicar    Allergies  Allergen Reactions   Other Other (See Comments)   Bee Venom Swelling   Eggs Or Egg-Derived Products Nausea And Vomiting   Enalapril Maleate     REACTION: Cough   Enalapril Maleate Other (See Comments)    REACTION: Cough   Lactose Diarrhea   Lactose Intolerance (Gi) Diarrhea   Olmesartan Other (See Comments)    REACTION: Gas and chest pain   Olmesartan Medoxomil     REACTION: Gas and chest pain   Almond (Diagnostic) Rash   Almond Oil Rash   Amlodipine Other (See Comments)    Fatigue lethargy at 5 mg dose   Latex Rash   Zetia [Ezetimibe] Other (See Comments)    GI cramps     Review of Systems:  No headache, visual changes, nausea, vomiting, diarrhea, constipation, dizziness, abdominal pain, skin rash, fevers, chills, night sweats, weight loss, swollen lymph nodes, body aches, joint swelling, chest pain, shortness of breath, mood changes. POSITIVE muscle aches  Objective  There were no  vitals taken for this visit.   General: No apparent distress alert and oriented x3 mood and affect normal, dressed appropriately.  HEENT: Pupils equal, extraocular movements intact  Respiratory: Patient's speak in full sentences and does not appear short of breath  Cardiovascular: No lower extremity edema, non tender, no erythema  Neuro: Cranial nerves II through XII are intact, neurovascularly intact in all extremities with 2+ DTRs and 2+ pulses.  Gait normal with good balance and coordination.  MSK:  Non tender with full range of motion and good stability and symmetric strength and tone of shoulders, elbows, wrist, hip, knee and ankles bilaterally.  Back - Normal skin, Spine with normal alignment and no deformity.  No tenderness to vertebral process palpation.  Paraspinous muscles are not tender and without spasm.   Range of motion is full at neck and lumbar sacral regions  Osteopathic findings  C2 flexed rotated and side bent right C6 flexed rotated and side bent left T3 extended rotated and side bent right inhaled rib T9 extended rotated and side bent left L2 flexed rotated and side bent right Sacrum right on right       Assessment and Plan:    Nonallopathic problems  Decision today to treat with OMT was based on Physical Exam  After verbal consent patient was treated with HVLA, ME, FPR techniques in cervical, rib, thoracic, lumbar, and sacral  areas  Patient  tolerated the procedure well with improvement in symptoms  Patient given exercises, stretches and lifestyle modifications  See medications in patient instructions if given  Patient will follow up in 4-8 weeks      The above documentation has been reviewed and is accurate and complete Lyndal Pulley, DO       Note: This dictation was prepared with Dragon dictation along with smaller phrase technology. Any transcriptional errors that result from this process are unintentional.

## 2021-05-30 ENCOUNTER — Telehealth: Payer: Self-pay | Admitting: Internal Medicine

## 2021-05-30 MED ORDER — METFORMIN HCL ER 500 MG PO TB24: 500.0000 mg | ORAL_TABLET | Freq: Two times a day (BID) | ORAL | 1 refills | Status: DC

## 2021-05-30 MED ORDER — NONFORMULARY OR COMPOUNDED ITEM: 0 refills | Status: DC

## 2021-05-30 NOTE — Telephone Encounter (Signed)
Patient is calling to request refills on metFORMIN (GLUCOPHAGE-XR) 500 MG 24 hr tablet and NONFORMULARY OR COMPOUNDED ITEM (compound estrogen cream) to be sent to [place], [place]  Please advise.

## 2021-05-30 NOTE — Telephone Encounter (Signed)
RX sent to pt's pharmacy. Estriol 0.75mg /ml called into the pharmacy due to rx being a nonformulary or compound item.

## 2021-06-17 DIAGNOSIS — R300 Dysuria: Secondary | ICD-10-CM | POA: Diagnosis not present

## 2021-06-17 DIAGNOSIS — Z3202 Encounter for pregnancy test, result negative: Secondary | ICD-10-CM | POA: Diagnosis not present

## 2021-06-26 ENCOUNTER — Ambulatory Visit: Payer: Medicare Other | Admitting: Internal Medicine

## 2021-07-03 ENCOUNTER — Ambulatory Visit: Payer: Medicare Other | Admitting: Internal Medicine

## 2021-07-09 DIAGNOSIS — N390 Urinary tract infection, site not specified: Secondary | ICD-10-CM | POA: Diagnosis not present

## 2021-07-19 ENCOUNTER — Telehealth: Payer: Self-pay | Admitting: Internal Medicine

## 2021-07-19 MED ORDER — ESTRADIOL 0.1 MG/GM VA CREA: TOPICAL_CREAM | VAGINAL | 3 refills | Status: DC

## 2021-07-19 NOTE — Telephone Encounter (Signed)
Pt call and stated she want a call back  to talk about a RX for  estradiol (ESTRACE) 0.1 MG/GM vaginal cream  pt is in [facility].

## 2021-07-19 NOTE — Telephone Encounter (Signed)
I sent in Rx with refills

## 2021-07-20 DIAGNOSIS — Z5320 Procedure and treatment not carried out because of patient's decision for unspecified reasons: Secondary | ICD-10-CM | POA: Diagnosis not present

## 2021-07-20 DIAGNOSIS — R300 Dysuria: Secondary | ICD-10-CM | POA: Diagnosis not present

## 2021-07-20 NOTE — Telephone Encounter (Signed)
Noted  

## 2021-07-25 ENCOUNTER — Telehealth: Payer: Self-pay | Admitting: Internal Medicine

## 2021-07-25 NOTE — Telephone Encounter (Signed)
Left message for patient to call back and schedule Medicare Annual Wellness Visit (AWV) either virtually or in office. Left  my Herbie Drape number [phone]   Last AWV 02/27/16  please schedule at anytime with [facility] Nurse Health Advisor 1 or 2   This should be a 45 minute visit.

## 2021-07-28 ENCOUNTER — Telehealth: Payer: Self-pay

## 2021-07-28 ENCOUNTER — Other Ambulatory Visit: Payer: Medicare Other

## 2021-07-28 ENCOUNTER — Other Ambulatory Visit: Payer: Self-pay

## 2021-07-28 DIAGNOSIS — Z79899 Other long term (current) drug therapy: Secondary | ICD-10-CM

## 2021-07-28 DIAGNOSIS — E119 Type 2 diabetes mellitus without complications: Secondary | ICD-10-CM

## 2021-07-28 NOTE — Telephone Encounter (Signed)
Orders have been placed for [facility] labs.

## 2021-07-28 NOTE — Telephone Encounter (Signed)
Patient called stating that she is at the [facility] lab waiting to get labs but there are No order in place.

## 2021-07-30 NOTE — Progress Notes (Signed)
Chief Complaint  Patient presents with   Follow-up     HPI: Valerie Brady 75 y.o. come in for Chronic disease management  Developed COVID-19 despite full immunization in May as well as her family. No significant cough but excess fatigue that is persisting on any given day getting better but continuing over the last 4 months. Post viral  sx  mental fog .  And fatigue achy and  tinnitus wax and wane.  Gets various tendinitis noticed left deltoid for no reason but no joint swelling.  Sometimes IBS flares up DM     ok  BP  ok.  HLD has not been tested recently no chest pain cardiovascular symptoms but is not interested in coronary artery calcium score and reassessment. Tendinitis  left deltoid.    Daughter had COVID but bounced back.  And was also sick but is resolved. ROS: See pertinent positives and negatives per HPI. Major changes in vision neuropathy had UTI problem 1 was in [place] helping brother had shoulder surgery x3 had run out of the estrogen topical which seems to have helped. Takes B12 vitamins. Past Medical History:  Diagnosis Date   Carotidynia    Diverticulitis of colon 05/22/2015   Gestational diabetes    Hyperlipidemia    Hypertension    Hypothyroidism    Syncope 2011   reaction to benicar    Family History  Problem Relation Age of Onset   Hypertension Mother    Dementia Mother    Heart disease Father    Dementia Father    Diabetes Father    Hypertension Brother     Social History   Socioeconomic History   Marital status: Married    Spouse name: Not on file   Number of children: Not on file   Years of education: Not on file   Highest education level: Not on file  Occupational History   Occupation: retired physician    Comment: internist  Tobacco Use   Smoking status: Never   Smokeless tobacco: Never  Vaping Use   Vaping Use: Never used  Substance and Sexual Activity   Alcohol use: Yes    Alcohol/week: 0.0 standard drinks    Comment:  occ wine   Drug use: No   Sexual activity: Never    Birth control/protection: Abstinence  Other Topics Concern   Not on file  Social History Narrative   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 180 tablet 1   cholecalciferol (VITAMIN D) 1000 units tablet Take 2,000 Units by mouth daily.      Cranberry-Vitamin C-Vitamin E (CRANBERRY PLUS VITAMIN C) 4200-20-3 MG-MG-UNIT CAPS Take by mouth.     Cyanocobalamin (B-12 PO) Take 1 tablet by mouth daily.      estradiol (ESTRACE) 0.1 MG/GM vaginal cream INSERT 1ML (4 CLICKS) VAGINALLY 3 TIMES WEEKLY. 36 g 3   gabapentin (NEURONTIN) 300 MG capsule TAKE (  1) CAPSULE THREE TIMES DAILY,OR AS DIRECTED 90 capsule 0   glucose blood (TRUE METRIX BLOOD GLUCOSE TEST) test strip CHECK BLOOD SUGAR ONCE A DAY.     LANCETS MICRO THIN 33G MISC Use to test blood glucose once daily. 100 each 3   Loratadine 10 MG CAPS as needed.     Melatonin 5 MG TABS Take 5 mg by mouth at bedtime.     NONFORMULARY OR COMPOUNDED ITEM Estriol 0.'75mg'$ /ml Cream: Insert 47m vaginally three times weekly 36 each 0   ondansetron (ZOFRAN) 4 MG tablet Take 1 tablet (4 mg total) by mouth every 8 (eight) hours as needed for nausea or vomiting. 20 tablet 1   Probiotic Product (ALIGN PO) Take by mouth.     sulfamethoxazole-trimethoprim (BACTRIM DS) 800-160 MG tablet Take 1 tablet by mouth 2 (two) times daily. 10 tablet 0   TRUE METRIX BLOOD GLUCOSE TEST test strip CHECK BLOOD SUGAR ONCE A DAY. 50 strip 3   levothyroxine  (SYNTHROID) 112 MCG tablet TAKE ONE TABLET ONCE DAILY. 90 tablet 0   metFORMIN (GLUCOPHAGE-XR) 500 MG 24 hr tablet Take 1 tablet (500 mg total) by mouth 2 (two) times daily with a meal. 180 tablet 1   No facility-administered medications prior to visit.     EXAM:  BP 138/70 (BP Location: Left Arm, Patient Position: Sitting, Cuff Size: Normal)   Pulse 66   Temp 98.2 F (36.8 C) (Oral)   Ht '5\' 5"'$  (1.651 m)   Wt 162 lb 9.6 oz (73.8 kg)   SpO2 95%   BMI 27.06 kg/m   Body mass index is 27.06 kg/m.  GENERAL: vitals reviewed and listed above, alert, oriented, appears well hydrated and in no acute distress HEENT: atraumatic, conjunctiva  clear, no obvious abnormalities on inspection of external nose and ears OP : Masked NECK: no obvious masses on inspection palpation thyroid palpable no nodule LUNGS: clear to auscultation bilaterally, no wheezes, rales or rhonchi, good air movement Abdomen soft without again megaly guarding or rebound CV: HRRR, no clubbing cyanosis or  peripheral edema nl cap refill  MS: moves all extremities without noticeable focal  abnormality PSYCH: pleasant and cooperative, no obvious depression or anxiety Lab Results  Component Value Date   WBC 8.7 10/14/2019   HGB 13.2 10/14/2019   HCT 39.3 10/14/2019   PLT 354.0 10/14/2019   GLUCOSE 112 (H) 10/14/2019   CHOL 273 (H) 10/14/2019   TRIG 244.0 (H) 10/14/2019   HDL 42.60 10/14/2019   LDLDIRECT 179.0 10/14/2019   LDLCALC 174 (H) 02/27/2016   ALT 10 10/14/2019   AST 12 10/14/2019   NA 139 10/14/2019   K 4.1 10/14/2019   CL 103 10/14/2019   CREATININE 0.76 10/14/2019   BUN 14 10/14/2019   CO2 28 10/14/2019   TSH 1.91 10/14/2019   HGBA1C 6.2 (A) 07/31/2021   MICROALBUR 8.5 09/13/2020   BP Readings from Last 3 Encounters:  07/31/21 138/70  02/08/21 122/84  12/28/20 132/76    ASSESSMENT AND PLAN:  Discussed the following assessment and plan:  Hypothyroidism, unspecified type - Plan: Basic  metabolic panel, Hepatic function panel, CBC with Differential/Platelet, Lipid panel, TSH, Microalbumin / creatinine urine ratio, Vitamin B12  Medication management - Plan: Basic metabolic panel, Hepatic function panel, CBC with Differential/Platelet, Lipid panel, TSH, Microalbumin / creatinine urine ratio, Vitamin B12  Hypertension complicating diabetes ([place] - Plan: Basic metabolic panel, Hepatic function panel, CBC with Differential/Platelet, Lipid panel, TSH, Microalbumin / creatinine urine ratio, Vitamin B12  Essential hypertension - Plan: Basic metabolic panel, Hepatic function panel, CBC with Differential/Platelet, Lipid panel, TSH, Microalbumin / creatinine urine ratio, Vitamin B12  History of recurrent UTIs - Plan: Basic metabolic panel, Hepatic function panel, CBC with Differential/Platelet, Lipid panel, TSH, Microalbumin / creatinine urine ratio, Vitamin B12  Postviral fatigue syndrome - Plan: Basic metabolic panel, Hepatic function panel, CBC with Differential/Platelet, Lipid panel, TSH, Microalbumin / creatinine urine ratio, Vitamin B12  Controlled type 2 diabetes mellitus with complication, without long-term current use of insulin (HCC) - Plan: Basic metabolic panel, Hepatic function panel, CBC with Differential/Platelet, Lipid panel, TSH, Microalbumin / creatinine urine ratio, Vitamin B12, POCT glycosylated hemoglobin (Hb A1C)  Cardiovascular risk factor  Hyperlipidemia associated with type 2 diabetes mellitus ([place])  Need for influenza vaccination - Plan: Flu Vaccine QUAD High Dose(Fluad)  Personal history of COVID-19 - 5 22 with prolonged x waxing and waning.  Overdue for labs previous lab orders got discontinued and confusion will get fasting labs again ordered at [facility] office. Discussed coronary artery calcium scan for further risk assessment since she has been able to tolerate previous medications can revisit this. Refill thyroid metformin today with refills. Type 2 diabetes  in excellent control. -Patient advised to return or notify health care team  if  new concerns arise.  Patient Instructions  Good to see you today. Fasting labs elam .  Will refill.thyroid med today .  Will order CAC scan.   To help decide on lipid risk management.  Flu shot today  FU  6 months depending on how doing.   I agre you have sx of post viral syndrome.     Standley Brooking. Eshaal Duby M.D.   The 10-year ASCVD risk score (Arnett DK, et al., 2019) is: 36.7%   Values used to calculate the score:     Age: 75 years     Sex: Female     Is Non-Hispanic African American: No     Diabetic: Yes     Tobacco smoker: No     Systolic Blood Pressure: 0000000 mmHg     Is BP treated: Yes     HDL Cholesterol: 42.6 mg/dL     Total Cholesterol: 273 mg/dL

## 2021-07-31 ENCOUNTER — Ambulatory Visit (INDEPENDENT_AMBULATORY_CARE_PROVIDER_SITE_OTHER): Payer: Medicare Other | Admitting: Internal Medicine

## 2021-07-31 ENCOUNTER — Encounter: Payer: Self-pay | Admitting: Internal Medicine

## 2021-07-31 ENCOUNTER — Other Ambulatory Visit: Payer: Self-pay

## 2021-07-31 VITALS — BP 138/70 | HR 66 | Temp 98.2°F | Ht 65.0 in | Wt 162.6 lb

## 2021-07-31 DIAGNOSIS — E118 Type 2 diabetes mellitus with unspecified complications: Secondary | ICD-10-CM

## 2021-07-31 DIAGNOSIS — I10 Essential (primary) hypertension: Secondary | ICD-10-CM | POA: Diagnosis not present

## 2021-07-31 DIAGNOSIS — E119 Type 2 diabetes mellitus without complications: Secondary | ICD-10-CM

## 2021-07-31 DIAGNOSIS — Z79899 Other long term (current) drug therapy: Secondary | ICD-10-CM

## 2021-07-31 DIAGNOSIS — E1159 Type 2 diabetes mellitus with other circulatory complications: Secondary | ICD-10-CM | POA: Diagnosis not present

## 2021-07-31 DIAGNOSIS — G9331 Postviral fatigue syndrome: Secondary | ICD-10-CM

## 2021-07-31 DIAGNOSIS — Z8616 Personal history of COVID-19: Secondary | ICD-10-CM | POA: Diagnosis not present

## 2021-07-31 DIAGNOSIS — Z23 Encounter for immunization: Secondary | ICD-10-CM | POA: Diagnosis not present

## 2021-07-31 DIAGNOSIS — G933 Postviral fatigue syndrome: Secondary | ICD-10-CM | POA: Diagnosis not present

## 2021-07-31 DIAGNOSIS — E785 Hyperlipidemia, unspecified: Secondary | ICD-10-CM | POA: Diagnosis not present

## 2021-07-31 DIAGNOSIS — E039 Hypothyroidism, unspecified: Secondary | ICD-10-CM | POA: Diagnosis not present

## 2021-07-31 DIAGNOSIS — Z87440 Personal history of urinary (tract) infections: Secondary | ICD-10-CM | POA: Diagnosis not present

## 2021-07-31 DIAGNOSIS — E1169 Type 2 diabetes mellitus with other specified complication: Secondary | ICD-10-CM

## 2021-07-31 DIAGNOSIS — I152 Hypertension secondary to endocrine disorders: Secondary | ICD-10-CM | POA: Diagnosis not present

## 2021-07-31 DIAGNOSIS — Z9189 Other specified personal risk factors, not elsewhere classified: Secondary | ICD-10-CM

## 2021-07-31 LAB — POCT GLYCOSYLATED HEMOGLOBIN (HGB A1C): Hemoglobin A1C: 6.2 % — AB (ref 4.0–5.6)

## 2021-07-31 MED ORDER — METFORMIN HCL ER 500 MG PO TB24: 500.0000 mg | ORAL_TABLET | Freq: Two times a day (BID) | ORAL | 1 refills | Status: DC

## 2021-07-31 MED ORDER — LEVOTHYROXINE SODIUM 112 MCG PO TABS: 112.0000 ug | ORAL_TABLET | Freq: Every day | ORAL | 3 refills | Status: DC

## 2021-07-31 NOTE — Patient Instructions (Signed)
Good to see you today. Fasting labs elam .  Will refill.thyroid med today .  Will order CAC scan.   To help decide on lipid risk management.  Flu shot today  FU  6 months depending on how doing.   I agre you have sx of post viral syndrome.

## 2021-08-18 ENCOUNTER — Other Ambulatory Visit: Payer: Self-pay | Admitting: Internal Medicine

## 2021-08-18 ENCOUNTER — Telehealth: Payer: Self-pay

## 2021-08-18 DIAGNOSIS — Z136 Encounter for screening for cardiovascular disorders: Secondary | ICD-10-CM

## 2021-08-18 DIAGNOSIS — I119 Hypertensive heart disease without heart failure: Secondary | ICD-10-CM

## 2021-08-18 DIAGNOSIS — E7800 Pure hypercholesterolemia, unspecified: Secondary | ICD-10-CM

## 2021-08-18 DIAGNOSIS — E785 Hyperlipidemia, unspecified: Secondary | ICD-10-CM

## 2021-08-18 NOTE — Telephone Encounter (Signed)
[facility] called requesting a call back to get more information on a referral  Call back # [phone]

## 2021-08-21 ENCOUNTER — Ambulatory Visit (INDEPENDENT_AMBULATORY_CARE_PROVIDER_SITE_OTHER)
Admission: RE | Admit: 2021-08-21 | Discharge: 2021-08-21 | Disposition: A | Discharge status: Home / Self Care | Payer: Self-pay | Source: Ambulatory Visit | Attending: Internal Medicine | Admitting: Internal Medicine

## 2021-08-21 ENCOUNTER — Other Ambulatory Visit: Payer: Self-pay

## 2021-08-21 ENCOUNTER — Telehealth: Payer: Self-pay | Admitting: Internal Medicine

## 2021-08-21 DIAGNOSIS — Z9861 Coronary angioplasty status: Secondary | ICD-10-CM

## 2021-08-21 DIAGNOSIS — E785 Hyperlipidemia, unspecified: Secondary | ICD-10-CM

## 2021-08-21 DIAGNOSIS — I2510 Atherosclerotic heart disease of native coronary artery without angina pectoris: Secondary | ICD-10-CM

## 2021-08-21 DIAGNOSIS — E1169 Type 2 diabetes mellitus with other specified complication: Secondary | ICD-10-CM

## 2021-08-21 DIAGNOSIS — E118 Type 2 diabetes mellitus with unspecified complications: Secondary | ICD-10-CM

## 2021-08-21 NOTE — Telephone Encounter (Signed)
Orders have been placed and Lisa from [facility] is aware.

## 2021-08-21 NOTE — Telephone Encounter (Signed)
Valerie Brady from [facility] called to have diagnostic codes put in for patient. Valerie Brady states she has tried but is unable to do it, will need to be put in from our office.

## 2021-08-21 NOTE — Telephone Encounter (Signed)
Error

## 2021-08-25 ENCOUNTER — Other Ambulatory Visit (INDEPENDENT_AMBULATORY_CARE_PROVIDER_SITE_OTHER): Payer: Medicare Other

## 2021-08-25 DIAGNOSIS — Z87440 Personal history of urinary (tract) infections: Secondary | ICD-10-CM

## 2021-08-25 DIAGNOSIS — I10 Essential (primary) hypertension: Secondary | ICD-10-CM | POA: Diagnosis not present

## 2021-08-25 DIAGNOSIS — E039 Hypothyroidism, unspecified: Secondary | ICD-10-CM | POA: Diagnosis not present

## 2021-08-25 DIAGNOSIS — G9331 Postviral fatigue syndrome: Secondary | ICD-10-CM | POA: Diagnosis not present

## 2021-08-25 DIAGNOSIS — I152 Hypertension secondary to endocrine disorders: Secondary | ICD-10-CM

## 2021-08-25 DIAGNOSIS — E118 Type 2 diabetes mellitus with unspecified complications: Secondary | ICD-10-CM | POA: Diagnosis not present

## 2021-08-25 DIAGNOSIS — Z79899 Other long term (current) drug therapy: Secondary | ICD-10-CM | POA: Diagnosis not present

## 2021-08-25 DIAGNOSIS — E1159 Type 2 diabetes mellitus with other circulatory complications: Secondary | ICD-10-CM | POA: Diagnosis not present

## 2021-08-25 LAB — CBC WITH DIFFERENTIAL/PLATELET
Basophils Absolute: 0.1 10*3/uL (ref 0.0–0.1)
Basophils Relative: 0.6 % (ref 0.0–3.0)
Eosinophils Absolute: 0.2 10*3/uL (ref 0.0–0.7)
Eosinophils Relative: 2.1 % (ref 0.0–5.0)
HCT: 40.2 % (ref 36.0–46.0)
Hemoglobin: 13.2 g/dL (ref 12.0–15.0)
Lymphocytes Relative: 52.5 % — ABNORMAL HIGH (ref 12.0–46.0)
Lymphs Abs: 4.5 10*3/uL — ABNORMAL HIGH (ref 0.7–4.0)
MCHC: 32.7 g/dL (ref 30.0–36.0)
MCV: 89.5 fl (ref 78.0–100.0)
Monocytes Absolute: 0.6 10*3/uL (ref 0.1–1.0)
Monocytes Relative: 7.4 % (ref 3.0–12.0)
Neutro Abs: 3.2 10*3/uL (ref 1.4–7.7)
Neutrophils Relative %: 37.4 % — ABNORMAL LOW (ref 43.0–77.0)
Platelets: 392 10*3/uL (ref 150.0–400.0)
RBC: 4.5 Mil/uL (ref 3.87–5.11)
RDW: 14 % (ref 11.5–15.5)
WBC: 8.6 10*3/uL (ref 4.0–10.5)

## 2021-08-25 LAB — BASIC METABOLIC PANEL
BUN: 16 mg/dL (ref 6–23)
CO2: 25 mEq/L (ref 19–32)
Calcium: 9.4 mg/dL (ref 8.4–10.5)
Chloride: 103 mEq/L (ref 96–112)
Creatinine, Ser: 0.78 mg/dL (ref 0.40–1.20)
GFR: 75.14 mL/min (ref >60.00)
Glucose, Bld: 124 mg/dL — ABNORMAL HIGH (ref 70–99)
Potassium: 3.9 mEq/L (ref 3.5–5.1)
Sodium: 136 mEq/L (ref 135–145)

## 2021-08-25 LAB — HEPATIC FUNCTION PANEL
ALT: 12 U/L (ref 0–35)
AST: 12 U/L (ref 0–37)
Albumin: 4.4 g/dL (ref 3.5–5.2)
Alkaline Phosphatase: 37 U/L — ABNORMAL LOW (ref 39–117)
Bilirubin, Direct: 0 mg/dL (ref 0.0–0.3)
Total Bilirubin: 0.4 mg/dL (ref 0.2–1.2)
Total Protein: 7.4 g/dL (ref 6.0–8.3)

## 2021-08-25 LAB — MICROALBUMIN / CREATININE URINE RATIO
Creatinine,U: 134.7 mg/dL
Microalb Creat Ratio: 0.5 mg/g (ref 0.0–30.0)
Microalb, Ur: <0.7 mg/dL (ref 0.0–1.9)

## 2021-08-25 LAB — LIPID PANEL
Cholesterol: 312 mg/dL — ABNORMAL HIGH (ref 0–200)
HDL: 49.3 mg/dL (ref >39.00)
NonHDL: 262.6
Total CHOL/HDL Ratio: 6
Triglycerides: 317 mg/dL — ABNORMAL HIGH (ref 0.0–149.0)
VLDL: 63.4 mg/dL — ABNORMAL HIGH (ref 0.0–40.0)

## 2021-08-25 LAB — LDL CHOLESTEROL, DIRECT: Direct LDL: 197 mg/dL

## 2021-08-25 LAB — TSH: TSH: 2.43 u[IU]/mL (ref 0.35–5.50)

## 2021-08-25 LAB — VITAMIN B12: Vitamin B-12: >1550 pg/mL — ABNORMAL HIGH (ref 211–911)

## 2021-08-27 NOTE — Progress Notes (Signed)
Stable levels except  lipids higher  more compelling reason for  medical intervnetion  ( see  advice on CAC score result )

## 2021-08-27 NOTE — Progress Notes (Signed)
CAC score  74 ,  60 %ile   and lipids now higher in 300 range Would advise reconsideration of medical intervention  acknowledging side effect  possibility.  Benefit more than risk .  Options : try a low dose  1-3 x per week statin  , consult with lipid clinic.or cardiology Let me know and we can make a virtual visit to discuss and /or appropriate referral .    Also Incidental small pulm nodule 16mm   ;currently I think no need for FU.

## 2021-08-28 ENCOUNTER — Other Ambulatory Visit: Payer: Self-pay | Admitting: Internal Medicine

## 2021-09-07 ENCOUNTER — Telehealth: Payer: Medicare Other | Admitting: Internal Medicine

## 2021-09-13 ENCOUNTER — Encounter: Payer: Self-pay | Admitting: Internal Medicine

## 2021-09-13 ENCOUNTER — Telehealth (INDEPENDENT_AMBULATORY_CARE_PROVIDER_SITE_OTHER): Payer: Medicare Other | Admitting: Internal Medicine

## 2021-09-13 VITALS — HR 72 | Ht 65.0 in | Wt 162.0 lb

## 2021-09-13 DIAGNOSIS — I2510 Atherosclerotic heart disease of native coronary artery without angina pectoris: Secondary | ICD-10-CM

## 2021-09-13 DIAGNOSIS — Z8616 Personal history of COVID-19: Secondary | ICD-10-CM | POA: Diagnosis not present

## 2021-09-13 DIAGNOSIS — Z9861 Coronary angioplasty status: Secondary | ICD-10-CM | POA: Diagnosis not present

## 2021-09-13 DIAGNOSIS — R931 Abnormal findings on diagnostic imaging of heart and coronary circulation: Secondary | ICD-10-CM | POA: Diagnosis not present

## 2021-09-13 DIAGNOSIS — Z789 Other specified health status: Secondary | ICD-10-CM

## 2021-09-13 DIAGNOSIS — E785 Hyperlipidemia, unspecified: Secondary | ICD-10-CM

## 2021-09-13 NOTE — Progress Notes (Signed)
Virtual Visit via Video Note  I connected withNAME@ on 09/13/21 at 12:30 PM EDT by a video enabled telemedicine application and verified that I am speaking with the correct person using two identifiers. Location patient: home Location provider:work o office Persons participating in the virtual visit: patient, provider  [place] national recommendations  regarding COVID 19 pandemic   video visit is advised over in office visit for this patient.  Patient aware  of the limitations of evaluation and management by telemedicine and  availability of in person appointments. and agreed to proceed.   HPI: Valerie Brady presents for video visit to follow-up on results of lipid and coronary calcium score.  She has continued to have waxing and waning symptoms consistent with long COVID or post viral syndrome with fatigue lower extremity body aches vertigo at times and frequent rest needs.  3 artery calcium score was done is in the 60+ percentile and her total lipid cholesterol is 300. In the past she has not been able to tolerate various statins and Zetia but not any have been tried recently.  Diabetes is well controlled.  ROS: See pertinent positives and negatives per HPI.  Past Medical History:  Diagnosis Date   Carotidynia    Diverticulitis of colon 05/22/2015   Gestational diabetes    Hyperlipidemia    Hypertension    Hypothyroidism    Syncope 2011   reaction to benicar    Past Surgical History:  Procedure Laterality Date   cervical polyp removed     FIBULA FRACTURE SURGERY     OPEN REDUCTION AND PINNING OF SPIRAL FX LEFT FIBULA   TONSILLECTOMY     TUBAL LIGATION      Family History  Problem Relation Age of Onset   Hypertension Mother    Dementia Mother    Heart disease Father    Dementia Father    Diabetes Father    Hypertension Brother     Social History   Tobacco Use   Smoking status: Never   Smokeless tobacco: Never  Vaping Use   Vaping Use: Never used   Substance Use Topics   Alcohol use: Yes    Alcohol/week: 0.0 standard drinks    Comment: occ wine   Drug use: No      Current Outpatient Medications:    carvedilol (COREG) 12.5 MG tablet, TAKE ONE TABLET BY MOUTH TWICE DAILY WITH A MEAL., Disp: 180 tablet, Rfl: 1   cholecalciferol (VITAMIN D) 1000 units tablet, Take 2,000 Units by mouth daily. , Disp: , Rfl:    Cranberry-Vitamin C-Vitamin E (CRANBERRY PLUS VITAMIN C) 4200-20-3 MG-MG-UNIT CAPS, Take by mouth., Disp: , Rfl:    Cyanocobalamin (B-12 PO), Take 1 tablet by mouth daily. , Disp: , Rfl:    estradiol (ESTRACE) 0.1 MG/GM vaginal cream, INSERT 1ML (4 CLICKS) VAGINALLY 3 TIMES WEEKLY., Disp: 36 g, Rfl: 3   gabapentin (NEURONTIN) 300 MG capsule, TAKE (1) CAPSULE THREE TIMES DAILY,OR AS DIRECTED, Disp: 90 capsule, Rfl: 0   glucose blood (TRUE METRIX BLOOD GLUCOSE TEST) test strip, CHECK BLOOD SUGAR ONCE A DAY., Disp: , Rfl:    LANCETS MICRO THIN 33G MISC, Use to test blood glucose once daily., Disp: 100 each, Rfl: 3   levothyroxine (SYNTHROID) 112 MCG tablet, Take 1 tablet (112 mcg total) by mouth daily., Disp: 90 tablet, Rfl: 3   Loratadine 10 MG CAPS, as needed., Disp: , Rfl:    Melatonin 5 MG TABS, Take 5 mg by mouth at bedtime., Disp: ,  Rfl:    metFORMIN (GLUCOPHAGE-XR) 500 MG 24 hr tablet, Take 1 tablet (500 mg total) by mouth 2 (two) times daily with a meal., Disp: 180 tablet, Rfl: 1   NONFORMULARY OR COMPOUNDED ITEM, Estriol 0.75mg /ml Cream: Insert 7ml vaginally three times weekly, Disp: 36 each, Rfl: 0   ondansetron (ZOFRAN) 4 MG tablet, Take 1 tablet (4 mg total) by mouth every 8 (eight) hours as needed for nausea or vomiting., Disp: 20 tablet, Rfl: 1   Probiotic Product (ALIGN PO), Take by mouth., Disp: , Rfl:    sulfamethoxazole-trimethoprim (BACTRIM DS) 800-160 MG tablet, Take 1 tablet by mouth 2 (two) times daily., Disp: 10 tablet, Rfl: 0   TRUE METRIX BLOOD GLUCOSE TEST test strip, CHECK BLOOD SUGAR ONCE A DAY., Disp: 50  strip, Rfl: 3  EXAM: BP Readings from Last 3 Encounters:  07/31/21 138/70  02/08/21 122/84  12/28/20 132/76    VITALS per patient if applicable:  GENERAL: alert, oriented, appears well and in no acute distress   PSYCH/NEURO: pleasant and cooperative, no obvious depression or anxiety, speech and thought processing grossly intact Lab Results  Component Value Date   WBC 8.6 08/25/2021   HGB 13.2 08/25/2021   HCT 40.2 08/25/2021   PLT 392.0 08/25/2021   GLUCOSE 124 (H) 08/25/2021   CHOL 312 (H) 08/25/2021   TRIG 317.0 (H) 08/25/2021   HDL 49.30 08/25/2021   LDLDIRECT 197.0 08/25/2021   LDLCALC 174 (H) 02/27/2016   ALT 12 08/25/2021   AST 12 08/25/2021   NA 136 08/25/2021   K 3.9 08/25/2021   CL 103 08/25/2021   CREATININE 0.78 08/25/2021   BUN 16 08/25/2021   CO2 25 08/25/2021   TSH 2.43 08/25/2021   HGBA1C 6.2 (A) 07/31/2021   MICROALBUR <0.7 08/25/2021    ASSESSMENT AND PLAN:  Discussed the following assessment and plan:    ICD-10-CM   1. Hyperlipidemia, unspecified hyperlipidemia type  E78.5     2. Elevated coronary artery calcium score  R93.1     3. Statin intolerance  Z78.9       Counseled.  Plan   referral  cards lipid s  evaluation Dr. Debara Pickett. Hx of se of  lipid lowering meds and  post viral covid  since may .   Expectant management and discussion of plan and treatment with opportunity to ask questions and all were answered. The patient agreed with the plan and demonstrated an understanding of the instructions.   Advised to call back or seek an in-person evaluation if worsening  or having  further concerns  in interim. No follow-ups on file.    Shanon Ace, MD

## 2021-09-14 ENCOUNTER — Telehealth: Payer: Self-pay | Admitting: *Deleted

## 2021-09-14 NOTE — Telephone Encounter (Signed)
Dr. Debara Pickett received correspondence from [facility] PCP office about needing a new lipid clinic consult appointment.   Left message for patient to call back to discuss / also sent MyChart message  Can work patient in to MD schedule on 10/03/21 @ 2:30pm, 10/09/21 @ 10:45am or ANY opening in the [place] office

## 2021-09-21 MED ORDER — ESTRADIOL 0.1 MG/GM VA CREA: TOPICAL_CREAM | VAGINAL | 3 refills | Status: DC

## 2021-10-03 ENCOUNTER — Ambulatory Visit: Payer: Medicare Other | Admitting: Internal Medicine

## 2021-10-09 ENCOUNTER — Ambulatory Visit: Payer: Medicare Other | Attending: Internal Medicine

## 2021-10-09 ENCOUNTER — Other Ambulatory Visit (HOSPITAL_BASED_OUTPATIENT_CLINIC_OR_DEPARTMENT_OTHER): Payer: Self-pay

## 2021-10-09 DIAGNOSIS — Z23 Encounter for immunization: Secondary | ICD-10-CM

## 2021-10-09 MED ORDER — PFIZER COVID-19 VAC BIVALENT 30 MCG/0.3ML IM SUSP
INTRAMUSCULAR | 0 refills | Status: DC
  Filled 2021-10-09: qty 0.3, 1d supply, fill #0

## 2021-10-09 NOTE — Progress Notes (Signed)
   Covid-19 Vaccination Clinic  Name:  Valerie Brady    MRN: [id number] DOB: Jun 29, 1946  10/09/2021  Ms. Valerie Brady was observed post Covid-19 immunization for 15 minutes without incident. She was provided with Vaccine Information Sheet and instruction to access the V-Safe system.   Ms. Valerie Brady was instructed to call 911 with any severe reactions post vaccine: Difficulty breathing  Swelling of face and throat  A fast heartbeat  A bad rash all over body  Dizziness and weakness   Immunizations Administered     Name Date Dose VIS Date Route   Pfizer Covid-19 Vaccine Bivalent Booster 10/09/2021  1:09 PM 0.3 mL 07/12/2021 Intramuscular   Manufacturer: [place]   Lot: [id number]   [place]: [phone]

## 2021-10-25 ENCOUNTER — Other Ambulatory Visit: Payer: Self-pay | Admitting: Internal Medicine

## 2021-11-08 ENCOUNTER — Ambulatory Visit: Payer: Medicare Other | Admitting: Internal Medicine

## 2021-11-16 ENCOUNTER — Encounter: Payer: Self-pay | Admitting: Internal Medicine

## 2021-11-17 NOTE — Telephone Encounter (Signed)
Yes ok to  schedule her daughter for cpx and pap  .  Please put this note in patient chart Valerie Brady)

## 2021-11-20 ENCOUNTER — Telehealth: Payer: Self-pay

## 2021-11-20 NOTE — Telephone Encounter (Signed)
Called mother to inform of message and I was told someone had already called and scheduled appt for daughter

## 2021-11-21 ENCOUNTER — Ambulatory Visit (INDEPENDENT_AMBULATORY_CARE_PROVIDER_SITE_OTHER): Payer: Medicare Other

## 2021-11-21 ENCOUNTER — Telehealth: Payer: Self-pay

## 2021-11-21 VITALS — Ht 65.5 in | Wt 162.0 lb

## 2021-11-21 DIAGNOSIS — Z0000 Encounter for general adult medical examination without abnormal findings: Secondary | ICD-10-CM | POA: Diagnosis not present

## 2021-11-21 NOTE — Patient Instructions (Signed)
Ms. Valerie Brady , Thank you for taking time to come for your Medicare Wellness Visit. I appreciate your ongoing commitment to your health goals. Please review the following plan we discussed and let me know if I can assist you in the future.   Screening recommendations/referrals: Colonoscopy: completed 09/15/2018 Mammogram: completed 11/17/2020 Bone Density: declines at this time Recommended yearly ophthalmology/optometry visit for glaucoma screening and checkup Recommended yearly dental visit for hygiene and checkup  Vaccinations: Influenza vaccine: completed 07/31/2021 Pneumococcal vaccine: completed 11/15/2016 Tdap vaccine: completed 03/05/2014, due 03/05/2024 Shingles vaccine: discussed   Covid-19: 10/09/2021, 08/16/2020, 01/12/2020, 12/17/2019  Advanced directives: Please bring a copy of your POA (Power of Attorney) and/or Living Will to your next appointment.   Conditions/risks identified: long covid  Next appointment: Follow up in one year for your annual wellness visit    Preventive Care 65 Years and Older, Female Preventive care refers to lifestyle choices and visits with your health care provider that can promote health and wellness. What does preventive care include? A yearly physical exam. This is also called an annual well check. Dental exams once or twice a year. Routine eye exams. Ask your health care provider how often you should have your eyes checked. Personal lifestyle choices, including: Daily care of your teeth and gums. Regular physical activity. Eating a healthy diet. Avoiding tobacco and drug use. Limiting alcohol use. Practicing safe sex. Taking low-dose aspirin every day. Taking vitamin and mineral supplements as recommended by your health care provider. What happens during an annual well check? The services and screenings done by your health care provider during your annual well check will depend on your age, overall health, lifestyle risk factors, and family history  of disease. Counseling  Your health care provider may ask you questions about your: Alcohol use. Tobacco use. Drug use. Emotional well-being. Home and relationship well-being. Sexual activity. Eating habits. History of falls. Memory and ability to understand (cognition). Work and work Statistician. Reproductive health. Screening  You may have the following tests or measurements: Height, weight, and BMI. Blood pressure. Lipid and cholesterol levels. These may be checked every 5 years, or more frequently if you are over 52 years old. Skin check. Lung cancer screening. You may have this screening every year starting at age 15 if you have a 30-pack-year history of smoking and currently smoke or have quit within the past 15 years. Fecal occult blood test (FOBT) of the stool. You may have this test every year starting at age 72. Flexible sigmoidoscopy or colonoscopy. You may have a sigmoidoscopy every 5 years or a colonoscopy every 10 years starting at age 68. Hepatitis C blood test. Hepatitis B blood test. Sexually transmitted disease (STD) testing. Diabetes screening. This is done by checking your blood sugar (glucose) after you have not eaten for a while (fasting). You may have this done every 1-3 years. Bone density scan. This is done to screen for osteoporosis. You may have this done starting at age 57. Mammogram. This may be done every 1-2 years. Talk to your health care provider about how often you should have regular mammograms. Talk with your health care provider about your test results, treatment options, and if necessary, the need for more tests. Vaccines  Your health care provider may recommend certain vaccines, such as: Influenza vaccine. This is recommended every year. Tetanus, diphtheria, and acellular pertussis (Tdap, Td) vaccine. You may need a Td booster every 10 years. Zoster vaccine. You may need this after age 23. Pneumococcal 13-valent conjugate (  PCV13) vaccine. One  dose is recommended after age 42. Pneumococcal polysaccharide (PPSV23) vaccine. One dose is recommended after age 92. Talk to your health care provider about which screenings and vaccines you need and how often you need them. This information is not intended to replace advice given to you by your health care provider. Make sure you discuss any questions you have with your health care provider. Document Released: 11/25/2015 Document Revised: 07/18/2016 Document Reviewed: 08/30/2015 Elsevier Interactive Patient Education  2017 [place] Prevention in the Home Falls can cause injuries. They can happen to people of all ages. There are many things you can do to make your home safe and to help prevent falls. What can I do on the outside of my home? Regularly fix the edges of walkways and driveways and fix any cracks. Remove anything that might make you trip as you walk through a door, such as a raised step or threshold. Trim any bushes or trees on the path to your home. Use bright outdoor lighting. Clear any walking paths of anything that might make someone trip, such as rocks or tools. Regularly check to see if handrails are loose or broken. Make sure that both sides of any steps have handrails. Any raised decks and porches should have guardrails on the edges. Have any leaves, snow, or ice cleared regularly. Use sand or salt on walking paths during winter. Clean up any spills in your garage right away. This includes oil or grease spills. What can I do in the bathroom? Use night lights. Install grab bars by the toilet and in the tub and shower. Do not use towel bars as grab bars. Use non-skid mats or decals in the tub or shower. If you need to sit down in the shower, use a plastic, non-slip stool. Keep the floor dry. Clean up any water that spills on the floor as soon as it happens. Remove soap buildup in the tub or shower regularly. Attach bath mats securely with double-sided  non-slip rug tape. Do not have throw rugs and other things on the floor that can make you trip. What can I do in the bedroom? Use night lights. Make sure that you have a light by your bed that is easy to reach. Do not use any sheets or blankets that are too big for your bed. They should not hang down onto the floor. Have a firm chair that has side arms. You can use this for support while you get dressed. Do not have throw rugs and other things on the floor that can make you trip. What can I do in the kitchen? Clean up any spills right away. Avoid walking on wet floors. Keep items that you use a lot in easy-to-reach places. If you need to reach something above you, use a strong step stool that has a grab bar. Keep electrical cords out of the way. Do not use floor polish or wax that makes floors slippery. If you must use wax, use non-skid floor wax. Do not have throw rugs and other things on the floor that can make you trip. What can I do with my stairs? Do not leave any items on the stairs. Make sure that there are handrails on both sides of the stairs and use them. Fix handrails that are broken or loose. Make sure that handrails are as long as the stairways. Check any carpeting to make sure that it is firmly attached to the stairs. Fix any carpet that is  loose or worn. Avoid having throw rugs at the top or bottom of the stairs. If you do have throw rugs, attach them to the floor with carpet tape. Make sure that you have a light switch at the top of the stairs and the bottom of the stairs. If you do not have them, ask someone to add them for you. What else can I do to help prevent falls? Wear shoes that: Do not have high heels. Have rubber bottoms. Are comfortable and fit you well. Are closed at the toe. Do not wear sandals. If you use a stepladder: Make sure that it is fully opened. Do not climb a closed stepladder. Make sure that both sides of the stepladder are locked into place. Ask  someone to hold it for you, if possible. Clearly mark and make sure that you can see: Any grab bars or handrails. First and last steps. Where the edge of each step is. Use tools that help you move around (mobility aids) if they are needed. These include: Canes. Walkers. Scooters. Crutches. Turn on the lights when you go into a dark area. Replace any light bulbs as soon as they burn out. Set up your furniture so you have a clear path. Avoid moving your furniture around. If any of your floors are uneven, fix them. If there are any pets around you, be aware of where they are. Review your medicines with your doctor. Some medicines can make you feel dizzy. This can increase your chance of falling. Ask your doctor what other things that you can do to help prevent falls. This information is not intended to replace advice given to you by your health care provider. Make sure you discuss any questions you have with your health care provider. Document Released: 08/25/2009 Document Revised: 04/05/2016 Document Reviewed: 12/03/2014 Elsevier Interactive Patient Education  2017 [employer].

## 2021-11-21 NOTE — Telephone Encounter (Signed)
This nurse left patient a message to inform her that she can call [facility] in order to see someone there or we can put in a referral for [facility] Dr. Tarri Glenn. Asked her to send a message via mychart if she wants the latter.

## 2021-11-21 NOTE — Progress Notes (Signed)
I connected with Valerie Brady today by telephone and verified that I am speaking with the correct person using two identifiers. Location patient: home Location provider: work Persons participating in the virtual visit: Valerie Brady, Giuffre LPN.   I discussed the limitations, risks, security and privacy concerns of performing an evaluation and management service by telephone and the availability of in person appointments. I also discussed with the patient that there may be a patient responsible charge related to this service. The patient expressed understanding and verbally consented to this telephonic visit.    Interactive audio and video telecommunications were attempted between this provider and patient, however failed, due to patient having technical difficulties OR patient did not have access to video capability.  We continued and completed visit with audio only.     Vital signs may be patient reported or missing.  Subjective:   Valerie Brady is a 76 y.o. female who presents for Medicare Annual (Subsequent) preventive examination.  Review of Systems     Cardiac Risk Factors include: advanced age (>51men, >36 women);diabetes mellitus;dyslipidemia;hypertension     Objective:    Today's Vitals   11/21/21 1044 11/21/21 1045  Weight: 162 lb (73.5 kg)   Height: 5' 5.5" (1.664 m)   PainSc:  3    Body mass index is 26.55 kg/m.  Advanced Directives 11/21/2021 12/15/2017  Does Patient Have a Medical Advance Directive? Yes No  Type of Paramedic of [place];Living will -  Copy of [place] in Chart? No - copy requested -  Would patient like information on creating a medical advance directive? - No - Patient declined    Current Medications (verified) Outpatient Encounter Medications as of 11/21/2021  Medication Sig   carvedilol (COREG) 12.5 MG tablet TAKE ONE TABLET BY MOUTH TWICE DAILY WITH A MEAL.   cholecalciferol (VITAMIN D)  1000 units tablet Take 2,000 Units by mouth daily.    Cranberry-Vitamin C-Vitamin E (CRANBERRY PLUS VITAMIN C) 4200-20-3 MG-MG-UNIT CAPS Take by mouth.   Cyanocobalamin (B-12 PO) Take 1 tablet by mouth daily. Once weekly   estradiol (ESTRACE) 0.1 MG/GM vaginal cream INSERT 1ML (4 CLICKS) VAGINALLY 3 TIMES WEEKLY.   gabapentin (NEURONTIN) 300 MG capsule TAKE (1) CAPSULE THREE TIMES DAILY,OR AS DIRECTED   glucose blood (TRUE METRIX BLOOD GLUCOSE TEST) test strip CHECK BLOOD SUGAR ONCE A DAY.   LANCETS MICRO THIN 33G MISC Use to test blood glucose once daily.   levothyroxine (SYNTHROID) 112 MCG tablet TAKE ONE TABLET ONCE A DAY   Melatonin 5 MG TABS Take 5 mg by mouth at bedtime.   metFORMIN (GLUCOPHAGE-XR) 500 MG 24 hr tablet Take 1 tablet (500 mg total) by mouth 2 (two) times daily with a meal.   TRUE METRIX BLOOD GLUCOSE TEST test strip CHECK BLOOD SUGAR ONCE A DAY.   COVID-19 mRNA bivalent vaccine, Pfizer, (PFIZER COVID-19 VAC BIVALENT) injection Inject into the muscle.   Loratadine 10 MG CAPS as needed. (Patient not taking: Reported on 11/21/2021)   NONFORMULARY OR COMPOUNDED ITEM Estriol 0.75mg /ml Cream: Insert 38ml vaginally three times weekly   ondansetron (ZOFRAN) 4 MG tablet Take 1 tablet (4 mg total) by mouth every 8 (eight) hours as needed for nausea or vomiting. (Patient not taking: Reported on 11/21/2021)   Probiotic Product (ALIGN PO) Take by mouth. (Patient not taking: Reported on 11/21/2021)   sulfamethoxazole-trimethoprim (BACTRIM DS) 800-160 MG tablet Take 1 tablet by mouth 2 (two) times daily.   No facility-administered encounter  medications on file as of 11/21/2021.    Allergies (verified) Other, Bee venom, Eggs or egg-derived products, Enalapril maleate, Enalapril maleate, Lactose, Lactose intolerance (gi), Olmesartan, Olmesartan medoxomil, Almond (diagnostic), Almond oil, Amlodipine, Latex, and Zetia [ezetimibe]   History: Past Medical History:  Diagnosis Date   Carotidynia     Diverticulitis of colon 05/22/2015   Gestational diabetes    Hyperlipidemia    Hypertension    Hypothyroidism    Syncope 2011   reaction to benicar   Past Surgical History:  Procedure Laterality Date   cervical polyp removed     FIBULA FRACTURE SURGERY     OPEN REDUCTION AND PINNING OF SPIRAL FX LEFT FIBULA   TONSILLECTOMY     TUBAL LIGATION     Family History  Problem Relation Age of Onset   Hypertension Mother    Dementia Mother    Heart disease Father    Dementia Father    Diabetes Father    Hypertension Brother    Social History   Socioeconomic History   Marital status: Married    Spouse name: Not on file   Number of children: Not on file   Years of education: Not on file   Highest education level: Not on file  Occupational History   Occupation: retired physician    Comment: internist  Tobacco Use   Smoking status: Never   Smokeless tobacco: Never  Vaping Use   Vaping Use: Never used  Substance and Sexual Activity   Alcohol use: Yes    Alcohol/week: 0.0 standard drinks    Comment: occ wine   Drug use: No   Sexual activity: Never    Birth control/protection: Abstinence  Other Topics Concern   Not on file  Social History Narrative   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   Neg tobacco    Married   [place] of 5   Pet cat   Mom  Dementia   Spouse memory decline    Social Determinants of Health   Financial Resource Strain: Low Risk    Difficulty of Paying Living Expenses: Not hard at all  Food Insecurity: No Food Insecurity   Worried About Charity fundraiser in the Last Year: Never true   Arboriculturist in the Last Year: Never true  Transportation Needs: No Transportation Needs   Lack of Transportation (Medical): No   Lack of Transportation (Non-Medical): No  Physical Activity: Inactive   Days of Exercise per Week: 0 days   Minutes of Exercise per Session: 0 min  Stress: Stress Concern Present   Feeling of Stress : To some extent  Social  Connections: Not on file    Tobacco Counseling Counseling given: Not Answered   Clinical Intake:  Pre-visit preparation completed: Yes  Pain : 0-10 Pain Score: 3  Pain Type: Chronic pain Pain Location: Generalized Pain Descriptors / Indicators: Aching Pain Onset: More than a month ago Pain Frequency: Intermittent     Nutritional Status: BMI 25 -29 Overweight Nutritional Risks: None Diabetes: No  How often do you need to have someone help you when you read instructions, pamphlets, or other written materials from your doctor or pharmacy?: 1 - Never  Diabetic? Yes Nutrition Risk Assessment:  Has the patient had any N/V/D within the last 2 months?  No  Does the patient have any non-healing wounds?  No  Has the patient had any unintentional weight loss or weight gain?  No   Diabetes:  Is the  patient diabetic?  Yes  If diabetic, was a CBG obtained today?  No  Did the patient bring in their glucometer from home?  No  How often do you monitor your CBG's? periodically.   Financial Strains and Diabetes Management:  Are you having any financial strains with the device, your supplies or your medication? No .  Does the patient want to be seen by Chronic Care Management for management of their diabetes?  No  Would the patient like to be referred to a Nutritionist or for Diabetic Management?  No   Diabetic Exams:  Diabetic Eye Exam: Overdue for diabetic eye exam. Pt has been advised about the importance in completing this exam. Patient advised to call and schedule an eye exam. Diabetic Foot Exam: Overdue, Pt has been advised about the importance in completing this exam. Pt is scheduled for diabetic foot exam on next appointment.   Interpreter Needed?: No  Information entered by :: NAllen LPN   Activities of Daily Living In your present state of health, do you have any difficulty performing the following activities: 11/21/2021  Hearing? Y  Comment sometimes due to tinnitus   Vision? N  Difficulty concentrating or making decisions? Y  Comment sometimes with word retrival  Walking or climbing stairs? N  Dressing or bathing? N  Doing errands, shopping? N  Preparing Food and eating ? N  Using the Toilet? N  In the past six months, have you accidently leaked urine? Y  Comment daily urgency  Do you have problems with loss of bowel control? N  Managing your Medications? N  Managing your Finances? N  Housekeeping or managing your Housekeeping? N  Some recent data might be hidden    Patient Care Team: Panosh, Standley Brooking, MD as PCP - General Richmond Campbell, MD (Gastroenterology) Clent Jacks, MD as Consulting Physician (Ophthalmology)  Indicate any recent Medical Services you may have received from other than [facility] providers in the past year (date may be approximate).     Assessment:   This is a routine wellness examination for [place].  Hearing/Vision screen Vision Screening - Comments:: Regular eye exams, Dr. Katy Fitch  Dietary issues and exercise activities discussed: Current Exercise Habits: The patient does not participate in regular exercise at present   Goals Addressed             This Visit's Progress    Patient Stated       11/21/2021, for long covid to go away, wants to set up a complimentary physician in [place] 2/9 Scores 11/21/2021 12/26/2020 08/23/2020 10/20/2019 05/01/2017 10/05/2016 02/27/2016  PHQ - 2 Score 2 0 0 0 4 0 0  PHQ- 9 Score 6 - - - - - -    Fall Risk Fall Risk  11/21/2021 12/26/2020 08/23/2020 10/20/2019 10/05/2019  Falls in the past year? 0 0 0 0 0  Comment - - - - Emmi Telephone Survey: data to providers prior to load  Number falls in past yr: - 0 0 0 -  Comment - - - - -  Injury with Fall? - 0 0 0 -  Risk for fall due to : Medication side effect - - - -  Follow up Falls evaluation completed;Education provided;Falls prevention discussed Falls evaluation completed - Falls evaluation completed -     FALL RISK PREVENTION PERTAINING TO THE HOME:  Any stairs in or around the home? Yes  If so, are there any without handrails? No  Home free of loose throw rugs in walkways, pet beds, electrical cords, etc? Yes  Adequate lighting in your home to reduce risk of falls? Yes   ASSISTIVE DEVICES UTILIZED TO PREVENT FALLS:  Life alert? No  Use of a cane, walker or w/c? No  Grab bars in the bathroom? No  Shower chair or bench in shower? Yes  Elevated toilet seat or a handicapped toilet? Yes   TIMED UP AND GO:  Was the test performed? No .      Cognitive Function:        Immunizations Immunization History  Administered Date(s) Administered   Fluad Quad(high Dose 65+) 07/31/2021   Hepatitis A 12/14/2015   Influenza Split 10/05/2016   Influenza, High Dose Seasonal PF 08/01/2017, 09/07/2018, 08/30/2019   Influenza,inj,Quad PF,6+ Mos 10/05/2016   Influenza-Unspecified 08/12/2018, 09/12/2020   PFIZER(Purple Top)SARS-COV-2 Vaccination 12/17/2019, 01/12/2020, 08/16/2020   Pfizer Covid-19 Vaccine Bivalent Booster 63yrs & up 10/09/2021   Pneumococcal Conjugate-13 11/15/2016   Pneumococcal Polysaccharide-23 06/09/2013   Td 12/13/2000, 03/05/2014   Typhoid Inactivated 12/14/2015   Zoster, Live 05/03/2009    TDAP status: Up to date  Flu Vaccine status: Up to date  Pneumococcal vaccine status: Up to date  Covid-19 vaccine status: Completed vaccines  Qualifies for Shingles Vaccine? Yes   Zostavax completed Yes   Shingrix Completed?: No.    Education has been provided regarding the importance of this vaccine. Patient has been advised to call insurance company to determine out of pocket expense if they have not yet received this vaccine. Advised may also receive vaccine at local pharmacy or Health Dept. Verbalized acceptance and understanding.  Screening Tests Health Maintenance  Topic Date Due   Hepatitis C Screening  Never done   Zoster Vaccines- Shingrix (1 of 2) Never  done   DEXA SCAN  Never done   FOOT EXAM  10/19/2020   OPHTHALMOLOGY EXAM  07/07/2021   HEMOGLOBIN A1C  01/28/2022   URINE MICROALBUMIN  08/25/2022   MAMMOGRAM  11/17/2022   TETANUS/TDAP  03/05/2024   COLONOSCOPY (Pts 45-31yrs Insurance coverage will need to be confirmed)  09/15/2028   Pneumonia Vaccine 30+ Years old  Completed   INFLUENZA VACCINE  Completed   COVID-19 Vaccine  Completed   HPV VACCINES  Aged Out    Health Maintenance  Health Maintenance Due  Topic Date Due   Hepatitis C Screening  Never done   Zoster Vaccines- Shingrix (1 of 2) Never done   DEXA SCAN  Never done   FOOT EXAM  10/19/2020   OPHTHALMOLOGY EXAM  07/07/2021    Colorectal cancer screening: Type of screening: Colonoscopy. Completed 09/15/2018. Repeat every 5 years  Mammogram status: Completed 11/17/2020. Repeat every year  Bone Density status: declines at this time  Lung Cancer Screening: (Low Dose CT Chest recommended if Age 22-80 years, 30 pack-year currently smoking OR have quit w/in 15years.) does not qualify.   Lung Cancer Screening Referral: no  Additional Screening:  Hepatitis C Screening: does qualify;   Vision Screening: Recommended annual ophthalmology exams for early detection of glaucoma and other disorders of the eye. Is the patient up to date with their annual eye exam?  No  Who is the provider or what is the name of the office in which the patient attends annual eye exams? Dr. Katy Fitch If pt is not established with a provider, would they like to be referred to a provider to establish care? No .   Dental Screening: Recommended annual dental exams  for proper oral hygiene  Community Resource Referral / Chronic Care Management: CRR required this visit?  No   CCM required this visit?  No      Plan:     I have personally reviewed and noted the following in the patients chart:   Medical and social history Use of alcohol, tobacco or illicit drugs  Current medications and  supplements including opioid prescriptions.  Functional ability and status Nutritional status Physical activity Advanced directives List of other physicians Hospitalizations, surgeries, and ER visits in previous 12 months Vitals Screenings to include cognitive, depression, and falls Referrals and appointments  In addition, I have reviewed and discussed with patient certain preventive protocols, quality metrics, and best practice recommendations. A written personalized care plan for preventive services as well as general preventive health recommendations were provided to patient.     Kellie Simmering, LPN   1/99/4129   Nurse Notes: 6 CIT not administered. Patient appeared cognitive per conversation.

## 2021-11-23 ENCOUNTER — Encounter: Payer: Self-pay | Admitting: Internal Medicine

## 2021-11-23 DIAGNOSIS — Z1231 Encounter for screening mammogram for malignant neoplasm of breast: Secondary | ICD-10-CM | POA: Diagnosis not present

## 2021-11-23 DIAGNOSIS — Z1211 Encounter for screening for malignant neoplasm of colon: Secondary | ICD-10-CM

## 2021-11-23 DIAGNOSIS — D369 Benign neoplasm, unspecified site: Secondary | ICD-10-CM

## 2021-11-23 LAB — HM MAMMOGRAPHY

## 2021-11-23 NOTE — Telephone Encounter (Signed)
Based on pathology findings from 2019 colonoscopy to adenomatous polyps & recommendation for repeat colonoscopy in 60yrs, referral placed.

## 2021-11-27 ENCOUNTER — Encounter: Payer: Self-pay | Admitting: Internal Medicine

## 2021-12-22 ENCOUNTER — Ambulatory Visit: Payer: Medicare Other | Admitting: Internal Medicine

## 2022-01-03 ENCOUNTER — Encounter: Payer: Self-pay | Admitting: Internal Medicine

## 2022-01-26 ENCOUNTER — Other Ambulatory Visit: Payer: Self-pay

## 2022-01-26 ENCOUNTER — Encounter: Payer: Self-pay | Admitting: Internal Medicine

## 2022-01-26 ENCOUNTER — Telehealth: Payer: Self-pay

## 2022-01-26 ENCOUNTER — Ambulatory Visit (INDEPENDENT_AMBULATORY_CARE_PROVIDER_SITE_OTHER): Payer: Medicare Other | Admitting: Internal Medicine

## 2022-01-26 VITALS — BP 128/72 | HR 70 | Ht 65.05 in | Wt 169.2 lb

## 2022-01-26 DIAGNOSIS — R931 Abnormal findings on diagnostic imaging of heart and coronary circulation: Secondary | ICD-10-CM | POA: Diagnosis not present

## 2022-01-26 DIAGNOSIS — E7800 Pure hypercholesterolemia, unspecified: Secondary | ICD-10-CM

## 2022-01-26 DIAGNOSIS — M791 Myalgia, unspecified site: Secondary | ICD-10-CM

## 2022-01-26 DIAGNOSIS — E119 Type 2 diabetes mellitus without complications: Secondary | ICD-10-CM

## 2022-01-26 DIAGNOSIS — T466X5A Adverse effect of antihyperlipidemic and antiarteriosclerotic drugs, initial encounter: Secondary | ICD-10-CM | POA: Diagnosis not present

## 2022-01-26 DIAGNOSIS — I10 Essential (primary) hypertension: Secondary | ICD-10-CM | POA: Diagnosis not present

## 2022-01-26 MED ORDER — REPATHA SURECLICK 140 MG/ML ~~LOC~~ SOAJ: 1.0000 | SUBCUTANEOUS | 11 refills | Status: DC

## 2022-01-26 NOTE — Patient Instructions (Signed)
Medication Instructions:  ?Dr. Debara Pickett recommends Repatha '140mg'$ /mL (PCSK9). This is an injectable cholesterol medication self-administered once every 14 days. This medication will likely need prior approval with your insurance company, which we will work on. If the medication is not approved initially, we may need to do an appeal with your insurance.  ? ?Administer medication in area of fatty tissue such as abdomen, outer thigh, back of upper arm - and rotate site with each injection ?Store medication in refrigerator until ready to administer - allow to sit at room temp for 30 mins - 1 hour prior to injection ?Dispose of medication in a SHARPS container - your pharmacy should be able to direct you on this and proper disposal  ? ?If you need a co-pay card for Repatha: [email or web address] >> paying for Repatha or red box that says "Repatha Copay Card" in top right ?If you need a co-pay card for Praluent: WedMap.it >> starting & paying for Praluent ? ?Patient Assistance:   ?The PAN Foundation: https://www.panfoundation.org/disease-funds/hypercholesterolemia/ ?-- can sign up for wait list ? ?The [employer] Well foundation offers assistance to help pay for medication copays.  They will cover copays for all cholesterol lowering meds, including statins, fibrates, omega-3 fish oils like Vascepa, ezetimibe, Repatha, Praluent, Nexletol, Nexlizet.  The cards are usually good for $2,500 or 12 months, whichever comes first. ?Go to healthwellfoundation.org ?Click on ?Apply Now? ?Answer questions as to whom is applying (patient or representative) ?Your disease fund will be ?hypercholesterolemia - Medicare access? ?They will ask questions about finances and which medications you are taking for cholesterol ?When you submit, the approval is usually within minutes.  You will need to print the card information from the site ?You will need to show this information to your pharmacy, they will bill your Medicare Part D plan first -then bill Health  Well --for the copay.   ?You can also call them at [phone], although the hold times can be quite long.  ? ? ? ?*If you need a refill on your cardiac medications before your next appointment, please call your pharmacy* ? ? ?Lab Work: ?FASTING cholesterol test in about 3-4 months  ?-- complete about 1 week before your next appointment  ? ?If you have labs (blood work) drawn today and your tests are completely normal, you will receive your results only by: ?MyChart Message (if you have MyChart) OR ?A paper copy in the mail ?If you have any lab test that is abnormal or we need to change your treatment, we will call you to review the results. ? ? ?Testing/Procedures: ?NONE ? ? ?Follow-Up: ?At [facility], you and your health needs are our priority.  As part of our continuing mission to provide you with exceptional heart care, we have created designated Provider Care Teams.  These Care Teams include your primary Cardiologist (physician) and Advanced Practice Providers (APPs -  Physician Assistants and Nurse Practitioners) who all work together to provide you with the care you need, when you need it. ? ?We recommend signing up for the patient portal called "MyChart".  Sign up information is provided on this After Visit Summary.  MyChart is used to connect with patients for Virtual Visits (Telemedicine).  Patients are able to view lab/test results, encounter notes, upcoming appointments, etc.  Non-urgent messages can be sent to your provider as well.   ?To learn more about what you can do with MyChart, go to [email or web address].   ? ?Your next appointment:   ?4 months with Dr. Debara Pickett  ? ?

## 2022-01-26 NOTE — Telephone Encounter (Addendum)
PA for Repatha SureClick '140MG'$ /ML, submitted by [facility], Key: [id number] Rx #: [id number]. ? ?Insurance Information ? ?[employer] ID: [id number] ? ?BIN [id number] ? ?PCN [id number] ? ?GRP [id number] ?

## 2022-01-26 NOTE — Telephone Encounter (Signed)
Patient approved for Repatha from 12/27/21 -- until further notice ?

## 2022-01-26 NOTE — Progress Notes (Signed)
? ? ?LIPID CLINIC CONSULT NOTE ? ?Chief Complaint:  ?Manage dyslipidemia ? ?Primary Care Physician: ?Burnis Medin, MD ? ?Primary Cardiologist:  ?None ? ?HPI:  ?Valerie Brady is a 76 y.o. female who is being seen today for the evaluation of dyslipidemia at the request of Panosh, Standley Brooking, MD. 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  She has a family history of heart disease in her father as well as high cholesterol.  She has had some degree of high cholesterol most of her life.  Recently she had COVID-19 and has been struggling with long-haul COVID-19 symptoms, mainly fatigue.  She has not been able to tolerate statins due to fatigue and myalgia in the past.  She also could not take ezetimibe due to stomach cramps.  She did have a coronary calcium score recently in October 2022 showing CAC score of 74.3, 60% for age and sex matched controls.  Recent lipid profile showed total cholesterol 312, triglycerides 317, HDL 49 and direct LDL 197.  She also has type 2 diabetes which has been reasonably controlled with A1c last at 6.2%, hypertension and hypothyroidism. ? ?PMHx:  ?Past Medical History:  ?Diagnosis Date  ? Carotidynia   ? Diverticulitis of colon 05/22/2015  ? Gestational diabetes   ? Hyperlipidemia   ? Hypertension   ? Hypothyroidism   ? Syncope 2011  ? reaction to benicar  ? ? ?Past Surgical History:  ?Procedure Laterality Date  ? cervical polyp removed    ? FIBULA FRACTURE SURGERY    ? OPEN REDUCTION AND PINNING OF SPIRAL FX LEFT FIBULA  ? TONSILLECTOMY    ? TUBAL LIGATION    ? ? ?FAMHx:  ?Family History  ?Problem Relation Age of Onset  ? Hypertension Mother   ? Dementia Mother   ? Heart disease Father   ? Dementia Father   ? Diabetes Father   ? Hypertension Brother   ? ? ?SOCHx:  ? reports that she has never smoked. She has never used smokeless tobacco. She reports  current alcohol use. She reports that she does not use drugs. ? ?ALLERGIES:  ?Allergies  ?Allergen Reactions  ? Other Other (See Comments)  ? Bee Venom Swelling  ? Eggs Or Egg-Derived Products Nausea And Vomiting  ? Enalapril Maleate   ?  REACTION: Cough  ? Enalapril Maleate Other (See Comments)  ?  REACTION: Cough  ? Lactose Diarrhea  ? Lactose Intolerance (Gi) Diarrhea  ? Olmesartan Other (See Comments)  ?  REACTION: Gas and chest pain  ? Olmesartan Medoxomil   ?  REACTION: Gas and chest pain  ? Almond (Diagnostic) Rash  ? Almond Oil Rash  ? Amlodipine Other (See Comments)  ?  Fatigue lethargy at 5 mg dose  ? Latex Rash  ? Zetia [Ezetimibe] Other (See Comments)  ?  GI cramps  ? ? ?ROS: ?Pertinent items noted in HPI and remainder of comprehensive ROS otherwise negative. ? ?HOME MEDS: ?Current Outpatient Medications on File Prior to Visit  ?Medication Sig Dispense Refill  ? carvedilol (COREG) 12.5 MG tablet TAKE ONE TABLET BY MOUTH TWICE DAILY WITH A MEAL. 180 tablet 1  ? cholecalciferol (VITAMIN D) 1000 units tablet Take 2,000 Units by mouth daily.     ? COVID-19 mRNA bivalent vaccine, Pfizer, (PFIZER COVID-19 VAC BIVALENT) injection Inject into the muscle.  0.3 mL 0  ? Cranberry-Vitamin C-Vitamin E (CRANBERRY PLUS VITAMIN C) 4200-20-3 MG-MG-UNIT CAPS Take by mouth.    ? Cyanocobalamin (B-12 PO) Take 1 tablet by mouth daily. Once weekly    ? estradiol (ESTRACE) 0.1 MG/GM vaginal cream INSERT 1ML (4 CLICKS) VAGINALLY 3 TIMES WEEKLY. 36 g 3  ? gabapentin (NEURONTIN) 300 MG capsule TAKE (1) CAPSULE THREE TIMES DAILY,OR AS DIRECTED 90 capsule 0  ? glucose blood (TRUE METRIX BLOOD GLUCOSE TEST) test strip CHECK BLOOD SUGAR ONCE A DAY.    ? LANCETS MICRO THIN 33G MISC Use to test blood glucose once daily. 100 each 3  ? levothyroxine (SYNTHROID) 112 MCG tablet TAKE ONE TABLET ONCE A DAY 30 tablet 0  ? Melatonin 5 MG TABS Take 5 mg by mouth at bedtime.    ? metFORMIN (GLUCOPHAGE-XR) 500 MG 24 hr tablet Take 1 tablet (500 mg  total) by mouth 2 (two) times daily with a meal. 180 tablet 1  ? ondansetron (ZOFRAN) 4 MG tablet Take 1 tablet (4 mg total) by mouth every 8 (eight) hours as needed for nausea or vomiting. 20 tablet 1  ? Probiotic Product (ALIGN PO) Take by mouth.    ? ?No current facility-administered medications on file prior to visit.  ? ? ?LABS/IMAGING: ?No results found for this or any previous visit (from the past 48 hour(s)). ?No results found. ? ?LIPID PANEL: ?   ?Component Value Date/Time  ? CHOL 312 (H) 08/25/2021 5053  ? TRIG 317.0 (H) 08/25/2021 9767  ? HDL 49.30 08/25/2021 0832  ? CHOLHDL 6 08/25/2021 0832  ? VLDL 63.4 (H) 08/25/2021 3419  ? [place] 174 (H) 02/27/2016 3790  ? LDLDIRECT 197.0 08/25/2021 0832  ? ? ?WEIGHTS: ?Wt Readings from Last 3 Encounters:  ?01/26/22 169 lb 3.2 oz (76.7 kg)  ?11/21/21 162 lb (73.5 kg)  ?09/13/21 162 lb (73.5 kg)  ? ? ?VITALS: ?BP 128/72   Pulse 70   Ht 5' 5.05" (1.652 m)   Wt 169 lb 3.2 oz (76.7 kg)   SpO2 97%   BMI 28.11 kg/m?  ? ?EXAM: ?General appearance: alert and no distress ?Neck: no carotid bruit, no JVD, and thyroid not enlarged, symmetric, no tenderness/mass/nodules ?Lungs: clear to auscultation bilaterally ?Heart: regular rate and rhythm ?Extremities: extremities normal, atraumatic, no cyanosis or edema ?Skin: Skin color, texture, turgor normal. No rashes or lesions ?Neurologic: Grossly normal ?Psych: Pleasant ? ?EKG: ?Deferred ? ?ASSESSMENT: ?Mixed dyslipidemia with LDL greater than 190, possible familial hyperlipidemia (based on Simon Broome criteria) ?Family history of coronary artery disease in first-degree relatives with high cholesterol ?Type 2 diabetes ?Hypertension ?Hypothyroidism ?Abnormal CAC score of 74.3, 60th percentile for age and sex matched controls (08/2021) ?Statin intolerance-myalgias, fatigue ?Zetia intolerance-GI side effects ? ?PLAN: ?1.   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  She does have heart disease in the family with high cholesterol and multiple coronary risk factors.  In addition she has ASCVD with an abnormal calcium score and greater than expected coronary calcium for her age.  Unfortunately she could not tolerate 4 different statins and Zetia which she tried in the past and options have been limited.  I think she is a good candidate for PCSK9 inhibitor especially given the concern for familial hyperlipidemia with her high LDL cholesterol, type 2 diabetes, known ASCVD, etc. as well as her intolerance to statins and ezetimibe, her options for treatment are limited.  Would recommend Repatha 140  mg every 2 weeks.  We will repeat lipid NMR and LP(a) in about 3 to 4 months and follow-up with me at that time. ? ?Thanks again for the kind referral. ? ?Pixie Casino, MD, [facility], FACP  ?[place]  ?Medical Director of the Advanced Lipid Disorders &  ?Cardiovascular Risk Reduction Clinic ?Diplomate of the [employer] of Clinical Lipidology ?Attending Cardiologist  ?Direct Dial: [phone]  Fax: [phone]  ?Website:  [email or web address] ? ?Nadean Corwin Kiana Hollar ?01/26/2022, 12:41 PM ?

## 2022-01-29 NOTE — Telephone Encounter (Signed)
Ok thanks .. if cost is prohibitive, then we could try another statin - not much data that COQ10 is helpful for the myalgias, so I generally don't recommend it. ? ?Dr Lemmie Evens ?

## 2022-01-30 ENCOUNTER — Other Ambulatory Visit: Payer: Self-pay | Admitting: Internal Medicine

## 2022-01-31 NOTE — Telephone Encounter (Signed)
[employer] has approved a request from you or your doctor for [place] '150MG'$ /ML ? ?This approval is for 01/01/2022 until further notice. ? ?This drug has been approved under the Member's Medicare Part D benefit. Approved  ?quantity: 2 units per 28 day(s). You may fill up to a 90 day supply except for those on  ?Specialty Tier 5, which can be filled up to a 30 day supply. Please call the pharmacy to  ?process the prescription claim ?

## 2022-01-31 NOTE — Telephone Encounter (Signed)
Per research of insurance, praluent is preferred on plan, thus co-pay may be more affordable.  ? ?MD message below is in response to: ?You  Hilty, Nadean Corwin, MD 18 minutes ago (2:32 PM)  ? ?See message about cost - I think it is cause Praluent is preferred on her plan. OK to change and re-process PA? Thanks   ? ? ?PA for praluent '150mg'$ /mL submitted via CMM ?KeyKirk Ruths - PA Case ID: [id number] ? ?

## 2022-01-31 NOTE — Telephone Encounter (Signed)
Yes .. by all means ?

## 2022-02-01 NOTE — Telephone Encounter (Signed)
Spoke with [employer] to check on cost of Praluent. At present time, copay is $273.41/month - should come down after she meets deductible, but unsure to what amount. Will update patient in [place] ?

## 2022-02-02 MED ORDER — PRALUENT 150 MG/ML ~~LOC~~ SOAJ: 1.0000 | SUBCUTANEOUS | 11 refills | Status: DC

## 2022-02-02 NOTE — Telephone Encounter (Signed)
Per message from patient, prefers Praluent d/t cost. Rx(s) sent to pharmacy electronically. ? ?

## 2022-02-02 NOTE — Addendum Note (Signed)
Addended by: Fidel Levy on: 02/02/2022 08:03 AM ? ? Modules accepted: Orders ? ?

## 2022-02-08 ENCOUNTER — Encounter: Payer: Self-pay | Admitting: Internal Medicine

## 2022-02-09 NOTE — Telephone Encounter (Signed)
Sorry having such a time.  Dont have any specific  knowledge about credible help . BUt I will  see if I can find out any help.  ? ?Certainly seems like a post viral fatigue syndrome I would ocassionaly  see in my practice  ? ?I will be out of town for 8 days  . ? ? ?

## 2022-02-19 MED ORDER — PRALUENT 150 MG/ML ~~LOC~~ SOAJ: 1.0000 | SUBCUTANEOUS | 11 refills | Status: DC

## 2022-02-19 NOTE — Addendum Note (Signed)
Addended by: Fidel Levy on: 02/19/2022 11:47 AM ? ? Modules accepted: Orders ? ?

## 2022-03-01 ENCOUNTER — Other Ambulatory Visit: Payer: Self-pay | Admitting: Internal Medicine

## 2022-03-28 DIAGNOSIS — G9001 Carotid sinus syncope: Secondary | ICD-10-CM | POA: Diagnosis not present

## 2022-03-28 DIAGNOSIS — M1929 Secondary osteoarthritis, other specified site: Secondary | ICD-10-CM | POA: Diagnosis not present

## 2022-03-28 DIAGNOSIS — Z8616 Personal history of COVID-19: Secondary | ICD-10-CM | POA: Diagnosis not present

## 2022-03-28 DIAGNOSIS — E119 Type 2 diabetes mellitus without complications: Secondary | ICD-10-CM | POA: Diagnosis not present

## 2022-03-28 DIAGNOSIS — E063 Autoimmune thyroiditis: Secondary | ICD-10-CM | POA: Diagnosis not present

## 2022-03-28 DIAGNOSIS — R5383 Other fatigue: Secondary | ICD-10-CM | POA: Diagnosis not present

## 2022-03-28 DIAGNOSIS — R6883 Chills (without fever): Secondary | ICD-10-CM | POA: Diagnosis not present

## 2022-03-28 DIAGNOSIS — H9313 Tinnitus, bilateral: Secondary | ICD-10-CM | POA: Diagnosis not present

## 2022-03-28 DIAGNOSIS — K580 Irritable bowel syndrome with diarrhea: Secondary | ICD-10-CM | POA: Diagnosis not present

## 2022-03-28 DIAGNOSIS — R195 Other fecal abnormalities: Secondary | ICD-10-CM | POA: Diagnosis not present

## 2022-03-28 DIAGNOSIS — Z85828 Personal history of other malignant neoplasm of skin: Secondary | ICD-10-CM | POA: Diagnosis not present

## 2022-03-28 DIAGNOSIS — G4701 Insomnia due to medical condition: Secondary | ICD-10-CM | POA: Diagnosis not present

## 2022-04-04 ENCOUNTER — Encounter: Payer: Self-pay | Admitting: Internal Medicine

## 2022-04-17 DIAGNOSIS — T56891S Toxic effect of other metals, accidental (unintentional), sequela: Secondary | ICD-10-CM | POA: Diagnosis not present

## 2022-04-17 DIAGNOSIS — E7211 Homocystinuria: Secondary | ICD-10-CM | POA: Diagnosis not present

## 2022-04-17 DIAGNOSIS — D513 Other dietary vitamin B12 deficiency anemia: Secondary | ICD-10-CM | POA: Diagnosis not present

## 2022-04-17 DIAGNOSIS — D520 Dietary folate deficiency anemia: Secondary | ICD-10-CM | POA: Diagnosis not present

## 2022-04-17 DIAGNOSIS — E531 Pyridoxine deficiency: Secondary | ICD-10-CM | POA: Diagnosis not present

## 2022-04-17 DIAGNOSIS — E8889 Other specified metabolic disorders: Secondary | ICD-10-CM | POA: Diagnosis not present

## 2022-04-17 DIAGNOSIS — E59 Dietary selenium deficiency: Secondary | ICD-10-CM | POA: Diagnosis not present

## 2022-04-17 DIAGNOSIS — E568 Deficiency of other vitamins: Secondary | ICD-10-CM | POA: Diagnosis not present

## 2022-04-18 DIAGNOSIS — R195 Other fecal abnormalities: Secondary | ICD-10-CM | POA: Diagnosis not present

## 2022-04-25 DIAGNOSIS — B379 Candidiasis, unspecified: Secondary | ICD-10-CM | POA: Diagnosis not present

## 2022-04-25 DIAGNOSIS — E2749 Other adrenocortical insufficiency: Secondary | ICD-10-CM | POA: Diagnosis not present

## 2022-04-25 DIAGNOSIS — B338 Other specified viral diseases: Secondary | ICD-10-CM | POA: Diagnosis not present

## 2022-04-25 DIAGNOSIS — E8889 Other specified metabolic disorders: Secondary | ICD-10-CM | POA: Diagnosis not present

## 2022-04-25 DIAGNOSIS — R5383 Other fatigue: Secondary | ICD-10-CM | POA: Diagnosis not present

## 2022-04-25 DIAGNOSIS — D8489 Other immunodeficiencies: Secondary | ICD-10-CM | POA: Diagnosis not present

## 2022-04-25 DIAGNOSIS — R21 Rash and other nonspecific skin eruption: Secondary | ICD-10-CM | POA: Diagnosis not present

## 2022-05-04 IMAGING — DX DG SI JOINTS 3+V
3 series · 3 of 3 positions shown · non-contrast
Comparison: None.

CLINICAL DATA: Chronic low back pain.

EXAM:
BILATERAL SACROILIAC JOINTS - 3+ VIEW

[pelvis ap]
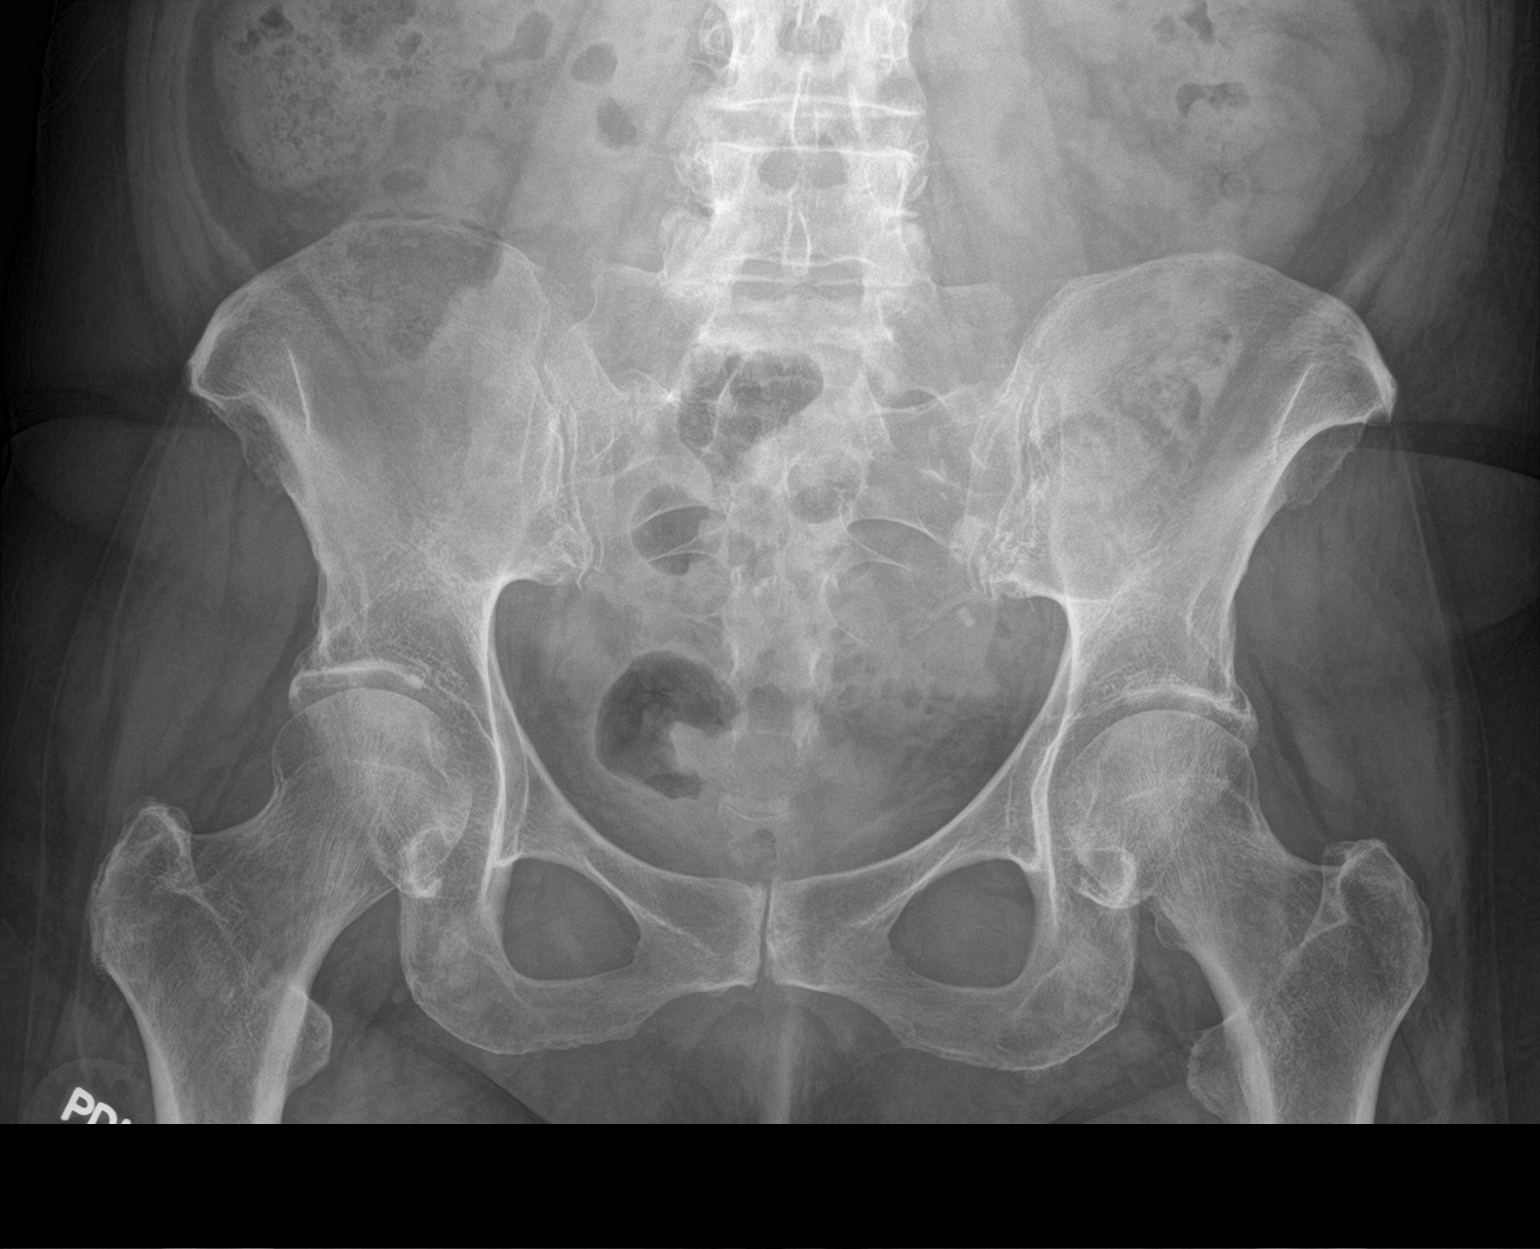

[si joint (1 of 2)]
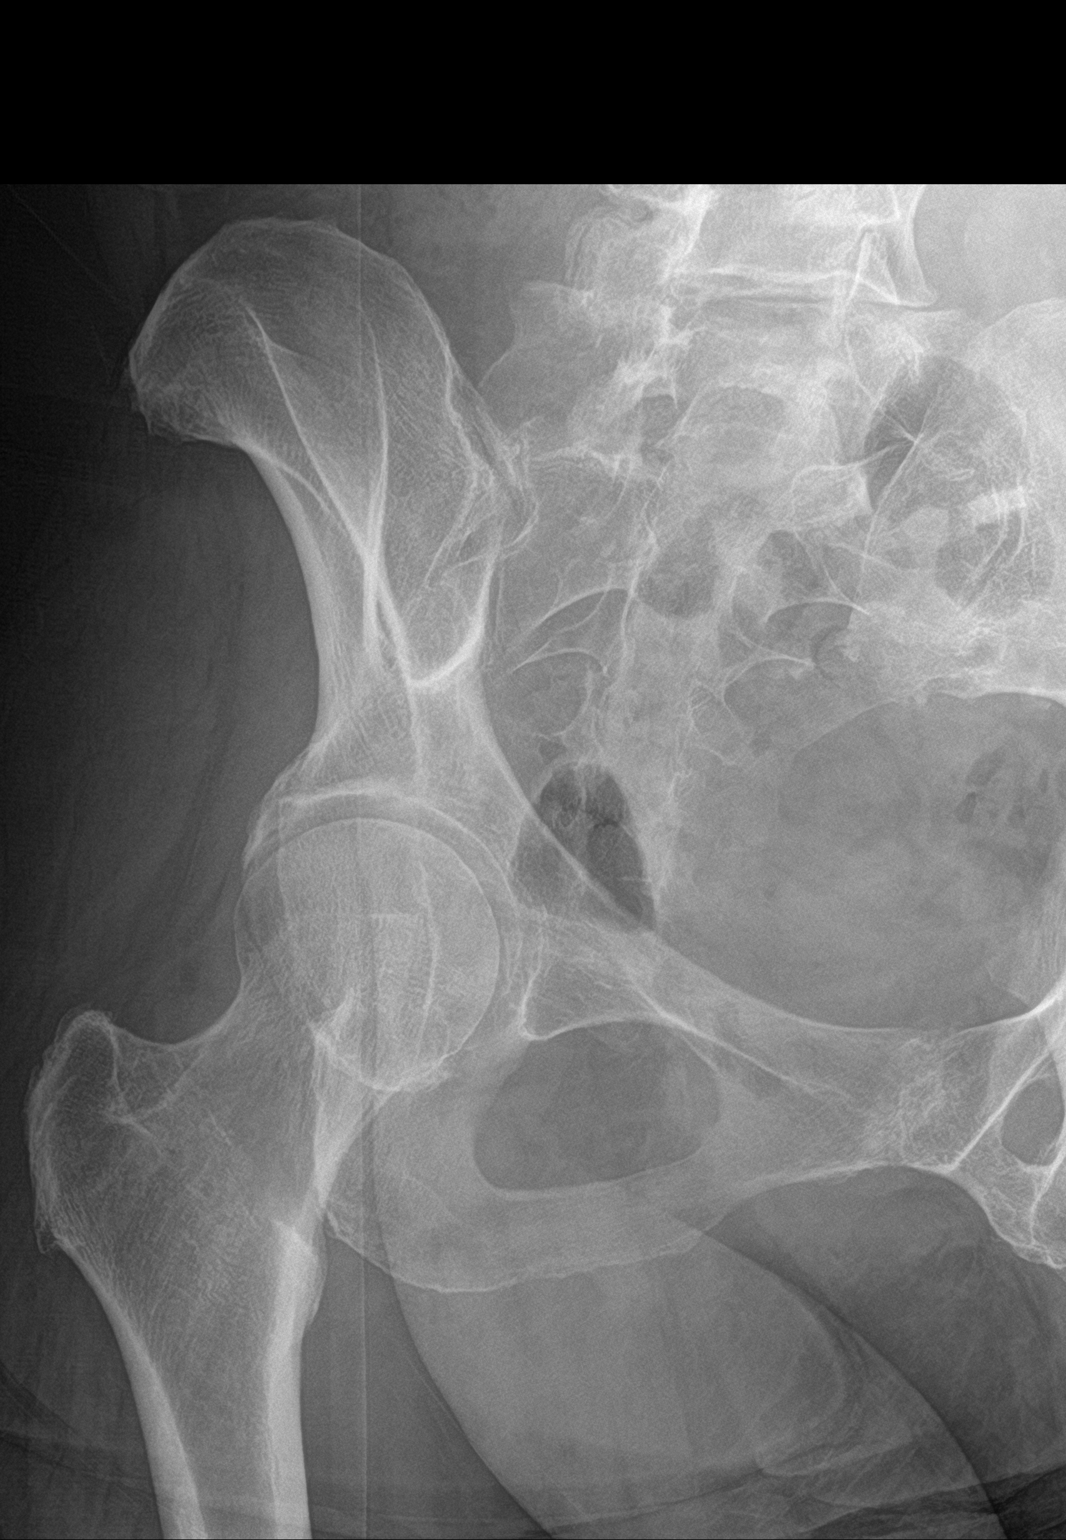

[si joint (2 of 2)]
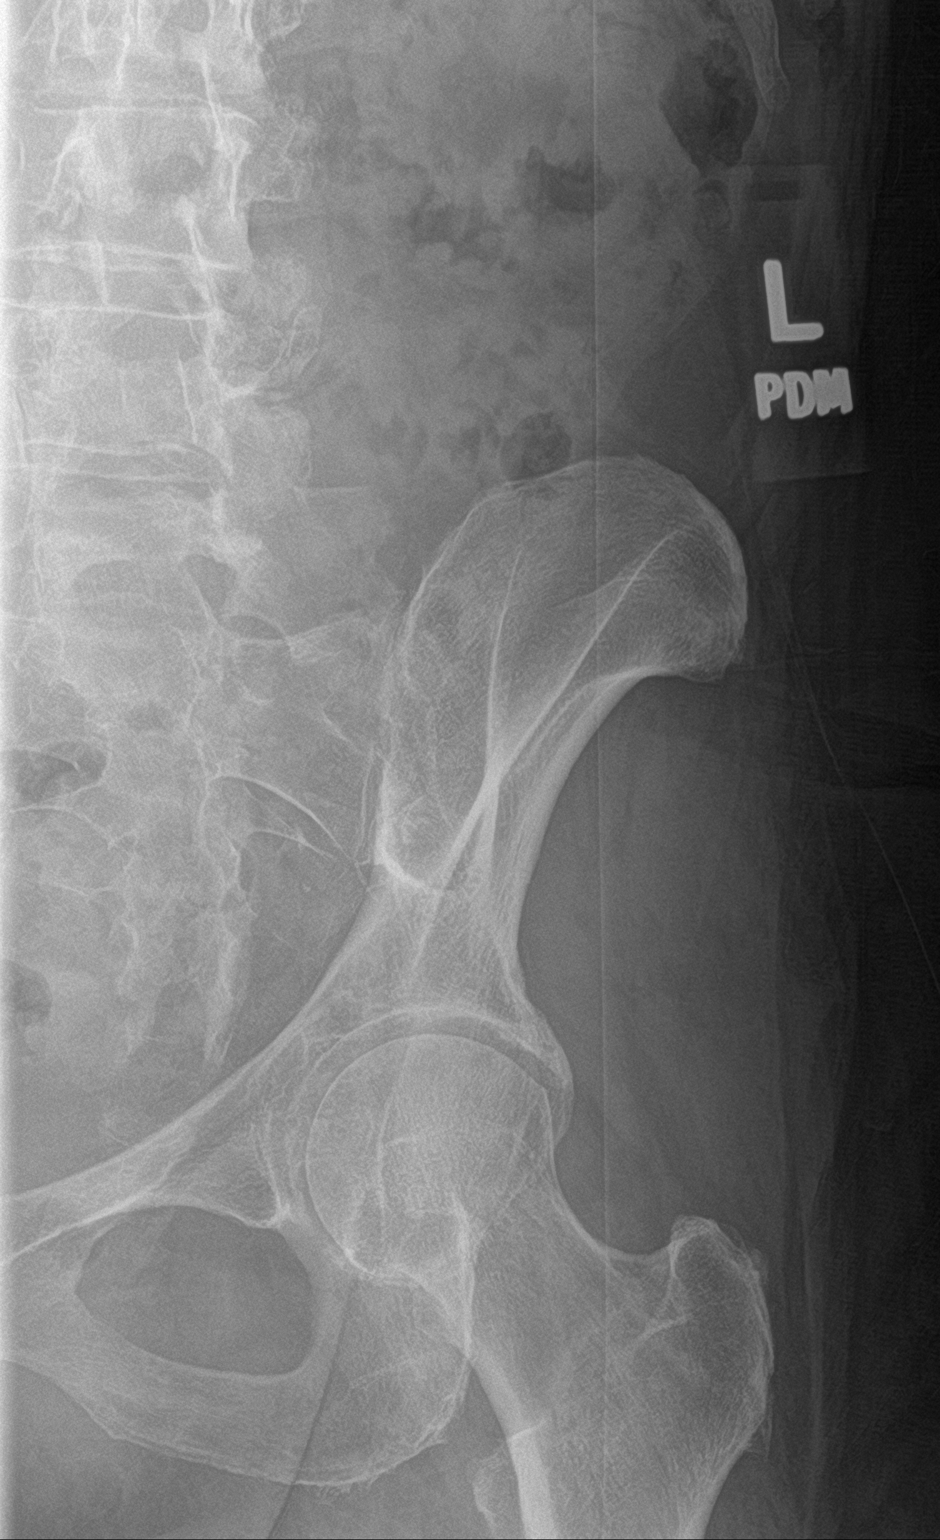

[3 of 3 positions shown; findings below may reference images not displayed]

FINDINGS: The sacroiliac joint spaces are maintained and there is no evidence
of arthropathy. No other bone abnormalities are seen.
IMPRESSION: Negative exam.

## 2022-05-10 DIAGNOSIS — E785 Hyperlipidemia, unspecified: Secondary | ICD-10-CM | POA: Diagnosis not present

## 2022-05-10 DIAGNOSIS — Z86010 Personal history of colonic polyps: Secondary | ICD-10-CM | POA: Diagnosis not present

## 2022-05-10 DIAGNOSIS — Z09 Encounter for follow-up examination after completed treatment for conditions other than malignant neoplasm: Secondary | ICD-10-CM | POA: Diagnosis not present

## 2022-05-10 DIAGNOSIS — K5730 Diverticulosis of large intestine without perforation or abscess without bleeding: Secondary | ICD-10-CM | POA: Diagnosis not present

## 2022-05-10 HISTORY — PX: COLONOSCOPY WITH PROPOFOL: SHX5780

## 2022-05-11 LAB — NMR, LIPOPROFILE
Cholesterol, Total: 127 mg/dL (ref 100–199)
HDL Particle Number: 34.5 umol/L (ref >=30.5)
HDL-C: 42 mg/dL (ref >39)
LDL Particle Number: 504 nmol/L (ref <1000)
LDL Size: 19.7 nm — ABNORMAL LOW (ref >20.5)
LDL-C (NIH Calc): 40 mg/dL (ref 0–99)
LP-IR Score: 51 — ABNORMAL HIGH (ref <=45)
Small LDL Particle Number: 334 nmol/L (ref <=527)
Triglycerides: 296 mg/dL — ABNORMAL HIGH (ref 0–149)

## 2022-05-11 LAB — LIPOPROTEIN A (LPA): Lipoprotein (a): 140.3 nmol/L — ABNORMAL HIGH (ref <75.0)

## 2022-05-16 ENCOUNTER — Ambulatory Visit: Payer: Medicare Other | Admitting: Internal Medicine

## 2022-05-16 ENCOUNTER — Encounter: Payer: Self-pay | Admitting: Internal Medicine

## 2022-05-28 DIAGNOSIS — E038 Other specified hypothyroidism: Secondary | ICD-10-CM | POA: Diagnosis not present

## 2022-05-28 DIAGNOSIS — G479 Sleep disorder, unspecified: Secondary | ICD-10-CM | POA: Diagnosis not present

## 2022-05-28 DIAGNOSIS — R7303 Prediabetes: Secondary | ICD-10-CM | POA: Diagnosis not present

## 2022-05-28 DIAGNOSIS — D72118 Other hypereosinophilic syndrome: Secondary | ICD-10-CM | POA: Diagnosis not present

## 2022-05-28 DIAGNOSIS — E063 Autoimmune thyroiditis: Secondary | ICD-10-CM | POA: Diagnosis not present

## 2022-06-22 DIAGNOSIS — R300 Dysuria: Secondary | ICD-10-CM | POA: Diagnosis not present

## 2022-07-17 DIAGNOSIS — D72118 Other hypereosinophilic syndrome: Secondary | ICD-10-CM | POA: Diagnosis not present

## 2022-07-17 DIAGNOSIS — E063 Autoimmune thyroiditis: Secondary | ICD-10-CM | POA: Diagnosis not present

## 2022-07-17 DIAGNOSIS — E038 Other specified hypothyroidism: Secondary | ICD-10-CM | POA: Diagnosis not present

## 2022-07-27 DIAGNOSIS — Z8616 Personal history of COVID-19: Secondary | ICD-10-CM | POA: Diagnosis not present

## 2022-07-27 DIAGNOSIS — R197 Diarrhea, unspecified: Secondary | ICD-10-CM | POA: Diagnosis not present

## 2022-07-27 DIAGNOSIS — R42 Dizziness and giddiness: Secondary | ICD-10-CM | POA: Diagnosis not present

## 2022-07-27 DIAGNOSIS — R195 Other fecal abnormalities: Secondary | ICD-10-CM | POA: Diagnosis not present

## 2022-07-27 DIAGNOSIS — E038 Other specified hypothyroidism: Secondary | ICD-10-CM | POA: Diagnosis not present

## 2022-08-07 ENCOUNTER — Ambulatory Visit (INDEPENDENT_AMBULATORY_CARE_PROVIDER_SITE_OTHER): Payer: Medicare Other | Admitting: Internal Medicine

## 2022-08-07 ENCOUNTER — Encounter (HOSPITAL_BASED_OUTPATIENT_CLINIC_OR_DEPARTMENT_OTHER): Payer: Self-pay | Admitting: Internal Medicine

## 2022-08-07 VITALS — BP 122/60 | HR 73 | Ht 66.0 in | Wt 162.3 lb

## 2022-08-07 DIAGNOSIS — R931 Abnormal findings on diagnostic imaging of heart and coronary circulation: Secondary | ICD-10-CM | POA: Diagnosis not present

## 2022-08-07 DIAGNOSIS — E7849 Other hyperlipidemia: Secondary | ICD-10-CM

## 2022-08-07 DIAGNOSIS — M791 Myalgia, unspecified site: Secondary | ICD-10-CM | POA: Diagnosis not present

## 2022-08-07 DIAGNOSIS — E7841 Elevated Lipoprotein(a): Secondary | ICD-10-CM

## 2022-08-07 DIAGNOSIS — T466X5D Adverse effect of antihyperlipidemic and antiarteriosclerotic drugs, subsequent encounter: Secondary | ICD-10-CM | POA: Diagnosis not present

## 2022-08-07 NOTE — Patient Instructions (Signed)
Medication Instructions:  NO CHANGES  *If you need a refill on your cardiac medications before your next appointment, please call your pharmacy*   Lab Work: FASTING lab work to check cholesterol in 1 year   If you have labs (blood work) drawn today and your tests are completely normal, you will receive your results only by: [place] (if you have MyChart) OR A paper copy in the mail If you have any lab test that is abnormal or we need to change your treatment, we will call you to review the results.   Testing/Procedures: NONE   Follow-Up: At [facility], you and your health needs are our priority.  As part of our continuing mission to provide you with exceptional heart care, we have created designated Provider Care Teams.  These Care Teams include your primary Cardiologist (physician) and Advanced Practice Providers (APPs -  Physician Assistants and Nurse Practitioners) who all work together to provide you with the care you need, when you need it.  We recommend signing up for the patient portal called "MyChart".  Sign up information is provided on this After Visit Summary.  MyChart is used to connect with patients for Virtual Visits (Telemedicine).  Patients are able to view lab/test results, encounter notes, upcoming appointments, etc.  Non-urgent messages can be sent to your provider as well.   To learn more about what you can do with MyChart, go to [email or web address].    Your next appointment:   12 month(s)  The format for your next appointment:   In Person  Provider:   Dr. Lyman Bishop

## 2022-08-07 NOTE — Progress Notes (Signed)
LIPID CLINIC CONSULT NOTE  Chief Complaint:  Manage dyslipidemia  Primary Care Physician: Burnis Medin, MD  Primary Cardiologist:  None  HPI:  Valerie Brady is a 76 y.o. female who is being seen today for the evaluation of dyslipidemia at the request of Panosh, Standley Brooking, MD. 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  She has a family history of heart disease in her father as well as high cholesterol.  She has had some degree of high cholesterol most of her life.  Recently she had COVID-19 and has been struggling with long-haul COVID-19 symptoms, mainly fatigue.  She has not been able to tolerate statins due to fatigue and myalgia in the past.  She also could not take ezetimibe due to stomach cramps.  She did have a coronary calcium score recently in October 2022 showing CAC score of 74.3, 60% for age and sex matched controls.  Recent lipid profile showed total cholesterol 312, triglycerides 317, HDL 49 and direct LDL 197.  She also has type 2 diabetes which has been reasonably controlled with A1c last at 6.2%, hypertension and hypothyroidism.  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  She successfully went on to Praluent which she is tolerating very well.  She has had a marked improvement in her lipids.  Her LDL particle number is now down to 504, LDL-C of 40, HDL-C42 and triglycerides 296.  Small LDL particle number is 334.  She does have an elevated LP(a) at 140.3.  This may have likely been about 20 to 30% higher prior to starting on PCSK9 inhibitor therapy.  PMHx:  Past Medical History:  Diagnosis Date   Carotidynia    Diverticulitis of colon 05/22/2015   Gestational diabetes    Hyperlipidemia    Hypertension    Hypothyroidism    Syncope 2011   reaction to benicar    Past Surgical History:  Procedure  Laterality Date   cervical polyp removed     FIBULA FRACTURE SURGERY     OPEN REDUCTION AND PINNING OF SPIRAL FX LEFT FIBULA   TONSILLECTOMY     TUBAL LIGATION      FAMHx:  Family History  Problem Relation Age of Onset   Hypertension Mother    Dementia Mother    Heart disease Father    Dementia Father    Diabetes Father    Hypertension Brother     SOCHx:   reports that she has never smoked. She has never used smokeless tobacco. She reports current alcohol use. She reports that she does not use drugs.  ALLERGIES:  Allergies  Allergen Reactions   Other Other (See Comments)   Bee Venom Swelling   Eggs Or Egg-Derived Products Nausea And Vomiting   Enalapril Maleate     REACTION: Cough   Enalapril Maleate Other (See Comments)    REACTION: Cough   Lactose Diarrhea   Lactose Intolerance (Gi) Diarrhea   Olmesartan Other (See Comments)    REACTION: Gas and chest pain   Olmesartan Medoxomil     REACTION: Gas and chest pain   Almond (Diagnostic) Rash   Almond Oil Rash   Amlodipine Other (See Comments)    Fatigue lethargy at 5 mg dose   Latex Rash   Zetia [Ezetimibe] Other (See Comments)    GI  cramps    ROS: Pertinent items noted in HPI and remainder of comprehensive ROS otherwise negative.  HOME MEDS: Current Outpatient Medications on File Prior to Visit  Medication Sig Dispense Refill   Alirocumab (PRALUENT) 150 MG/ML SOAJ Inject 1 Dose into the skin every 14 (fourteen) days. 2 mL 11   carvedilol (COREG) 12.5 MG tablet TAKE ONE TABLET BY MOUTH TWICE DAILY WITH A MEAL. 180 tablet 1   cholecalciferol (VITAMIN D) 1000 units tablet Take 2,000 Units by mouth daily.      Cranberry-Vitamin C-Vitamin E (CRANBERRY PLUS VITAMIN C) 4200-20-3 MG-MG-UNIT CAPS Take by mouth.     Cyanocobalamin (B-12 PO) Take 1 tablet by mouth daily. Once weekly     estradiol (ESTRACE) 0.1 MG/GM vaginal cream INSERT 1ML (4 CLICKS) VAGINALLY 3 TIMES WEEKLY. 36 g 3   gabapentin (NEURONTIN) 300 MG  capsule TAKE (1) CAPSULE THREE TIMES DAILY,OR AS DIRECTED 90 capsule 0   LANCETS MICRO THIN 33G MISC Use to test blood glucose once daily. 100 each 3   levothyroxine (SYNTHROID) 112 MCG tablet TAKE ONE TABLET BY MOUTH DAILY 90 tablet 3   liothyronine (CYTOMEL) 5 MCG tablet Take 10 mcg by mouth daily.     Melatonin 5 MG TABS Take 5 mg by mouth at bedtime.     metFORMIN (GLUCOPHAGE) 1000 MG tablet Take 1,000 mg by mouth 2 (two) times daily.     NALTREXONE HCL PO 4.5 mg daily.     ondansetron (ZOFRAN) 4 MG tablet Take 1 tablet (4 mg total) by mouth every 8 (eight) hours as needed for nausea or vomiting. 20 tablet 1   Probiotic Product (ALIGN PO) Take by mouth.     TRUE METRIX BLOOD GLUCOSE TEST test strip CHECK BLOOD SUGAR ONCE A DAY. 50 each PRN   COVID-19 mRNA bivalent vaccine, Pfizer, (PFIZER COVID-19 VAC BIVALENT) injection Inject into the muscle. (Patient not taking: Reported on 08/07/2022) 0.3 mL 0   No current facility-administered medications on file prior to visit.    LABS/IMAGING: No results found for this or any previous visit (from the past 48 hour(s)). No results found.  LIPID PANEL:    Component Value Date/Time   CHOL 312 (H) 08/25/2021 0832   TRIG 317.0 (H) 08/25/2021 0832   HDL 49.30 08/25/2021 0832   CHOLHDL 6 08/25/2021 0832   VLDL 63.4 (H) 08/25/2021 0832   LDLCALC 174 (H) 02/27/2016 0858   LDLDIRECT 197.0 08/25/2021 0832    WEIGHTS: Wt Readings from Last 3 Encounters:  08/07/22 162 lb 4.8 oz (73.6 kg)  01/26/22 169 lb 3.2 oz (76.7 kg)  11/21/21 162 lb (73.5 kg)    VITALS: BP 122/60   Pulse 73   Ht '5\' 6"'$  (1.676 m)   Wt 162 lb 4.8 oz (73.6 kg)   SpO2 97%   BMI 26.20 kg/m   EXAM: Deferred  EKG: Deferred  ASSESSMENT: Mixed dyslipidemia with LDL greater than 190, possible familial hyperlipidemia (based on Simon Broome criteria) Family history of coronary artery disease in first-degree relatives with high cholesterol Type 2  diabetes Hypertension Hypothyroidism Abnormal CAC score of 74.3, 60th percentile for age and sex matched controls (08/2021) Statin intolerance-myalgias, fatigue Zetia intolerance-GI side effects Elevated LP(a) at 140.3  PLAN: 1.   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  Her LP(a) is elevated at 140.3, conferring increased risk of cardiovascular disease.  Overall she is doing very well on therapy.  I would continue this indefinitely.  We  will plan repeat lipid NMR in about 1 year and follow-up at that time.  She is encouraged to contact me if she has any coronary symptoms.  Pixie Casino, MD, [facility], [place] Director of the Advanced Lipid Disorders &  Cardiovascular Risk Reduction Clinic Diplomate of the American Board of Clinical Lipidology Attending Cardiologist  Direct Dial: [phone]  Fax: [phone]  Website:  [email or web address] Daxen Lanum 08/07/2022, 2:24 PM

## 2022-08-15 DIAGNOSIS — H40013 Open angle with borderline findings, low risk, bilateral: Secondary | ICD-10-CM | POA: Diagnosis not present

## 2022-08-15 DIAGNOSIS — H43811 Vitreous degeneration, right eye: Secondary | ICD-10-CM | POA: Diagnosis not present

## 2022-08-15 DIAGNOSIS — H04123 Dry eye syndrome of bilateral lacrimal glands: Secondary | ICD-10-CM | POA: Diagnosis not present

## 2022-08-15 DIAGNOSIS — H2513 Age-related nuclear cataract, bilateral: Secondary | ICD-10-CM | POA: Diagnosis not present

## 2022-08-15 DIAGNOSIS — E119 Type 2 diabetes mellitus without complications: Secondary | ICD-10-CM | POA: Diagnosis not present

## 2022-08-17 ENCOUNTER — Other Ambulatory Visit (HOSPITAL_BASED_OUTPATIENT_CLINIC_OR_DEPARTMENT_OTHER): Payer: Self-pay

## 2022-08-21 ENCOUNTER — Other Ambulatory Visit: Payer: Self-pay | Admitting: Internal Medicine

## 2022-08-29 DIAGNOSIS — Z23 Encounter for immunization: Secondary | ICD-10-CM | POA: Diagnosis not present

## 2022-09-27 ENCOUNTER — Other Ambulatory Visit: Payer: Self-pay | Admitting: Internal Medicine

## 2022-10-24 DIAGNOSIS — R350 Frequency of micturition: Secondary | ICD-10-CM | POA: Diagnosis not present

## 2022-10-24 DIAGNOSIS — N390 Urinary tract infection, site not specified: Secondary | ICD-10-CM | POA: Diagnosis not present

## 2022-10-24 NOTE — Progress Notes (Signed)
[place] [facility] [place] Phone: [phone] Subjective:   Valerie Valerie Brady, am serving as a scribe for Dr. Hulan Saas.  I'm seeing this patient by the request  of:  Panosh, Standley Brooking, MD  CC: Left shoulder pain  [id number]  Valerie Valerie Brady is a 76 y.o. female coming in with complaint of back and neck pain. OMT March 2022. Patient having issue with L shoulder and upper back.  Patient states that she is having continued thoracic spine pain due to playing piano.   Also having pain in L shoulder with abduction. Painful over deltoid insertion over past 9 months. Does not recall any incident that would have caused her pain. Has myalgias due to suffering from Valerie Valerie Brady. Denies any radiating symptoms.   Medications patient has been prescribed:   Taking:         Reviewed prior external information including notes and imaging from previsou exam, outside providers and external EMR if available.   As well as notes that were available from care everywhere and other healthcare systems.  Past medical history, social, surgical and family history all reviewed in electronic medical record.  Valerie Brady pertanent information unless stated regarding to the chief complaint.   Past Medical History:  Diagnosis Date   Carotidynia    Diverticulitis of colon 05/22/2015   Gestational diabetes    Hyperlipidemia    Hypertension    Hypothyroidism    Syncope 2011   reaction to benicar    Allergies  Allergen Reactions   Other Other (See Comments)   Bee Venom Swelling   Eggs Or Egg-Derived Products Nausea And Vomiting   Enalapril Maleate     REACTION: Cough   Enalapril Maleate Other (See Comments)    REACTION: Cough   Lactose Diarrhea   Lactose Intolerance (Gi) Diarrhea   Olmesartan Other (See Comments)    REACTION: Gas and chest pain   Olmesartan Medoxomil     REACTION: Gas and chest pain   Almond (Diagnostic) Rash   Almond Oil Rash    Amlodipine Other (See Comments)    Fatigue lethargy at 5 mg dose   Latex Rash   Zetia [Ezetimibe] Other (See Comments)    GI cramps     Review of Systems:  Valerie Brady headache, visual changes, nausea, vomiting, diarrhea, constipation, dizziness, abdominal pain, skin rash, fevers, chills, night sweats, weight loss, swollen lymph nodes,  joint swelling, chest pain, shortness of breath, mood changes. POSITIVE muscle aches, body aches, fatigue  Objective  Blood pressure 116/68, pulse 84, height '5\' 6"'$  (1.676 m), weight 160 lb (72.6 kg), SpO2 95 %.   General: Valerie Brady apparent distress alert and oriented x3 mood and affect normal, dressed appropriately.  HEENT: Pupils equal, extraocular movements intact  Respiratory: Patient's speak in full sentences and does not appear short of breath  Cardiovascular: Valerie Brady lower extremity edema, non tender, Valerie Brady erythema  Left shoulder shows some scapular dyskinesis noted.  Patient has some mild weakness but very minimal compared to even the contralateral side.  Patient does have positive impingement noted.  Osteopathic findings  C2 flexed rotated and side bent right C5 flexed rotated and side bent left T3 extended rotated and side bent right inhaled rib T5 extended rotated and side bent left L5 flexed rotated and side bent right Sacrum right on right       Assessment and Plan:  SI (sacroiliac) joint dysfunction Some increase in the tenderness noted.  Patient does  have some tightness noted of the left shoulder as well which likely has more of impingement of the shoulder and we will continue to monitor.  Discussed icing regimen and home exercises.  Increase activity slowly.  Follow-up again in 6 to 8 weeks  Impingement of left shoulder Seems to be more management.  Has had long COVID it appears.  Discussed with patient about icing regimen and home exercises.  Discussed which activities to do and which ones to avoid.  Attempted osteopathic manipulation.  Discussed  vitamin supplementations.  Total time talking the patient as well as reviewing outside records 33 minutes    Nonallopathic problems  Decision today to treat with OMT was based on Physical Exam  After verbal consent patient was treated with HVLA, ME, FPR techniques in cervical, rib, thoracic, lumbar, and sacral  areas  Patient tolerated the procedure well with improvement in symptoms  Patient given exercises, stretches and lifestyle modifications  See medications in patient instructions if given  Patient will follow up in 4-8 weeks     The above documentation has been reviewed and is accurate and complete Lyndal Pulley, DO         Note: This dictation was prepared with Dragon dictation along with smaller phrase technology. Any transcriptional errors that result from this process are unintentional.

## 2022-10-26 DIAGNOSIS — B349 Viral infection, unspecified: Secondary | ICD-10-CM | POA: Diagnosis not present

## 2022-10-26 DIAGNOSIS — M7918 Myalgia, other site: Secondary | ICD-10-CM | POA: Diagnosis not present

## 2022-10-26 DIAGNOSIS — B2700 Gammaherpesviral mononucleosis without complication: Secondary | ICD-10-CM | POA: Diagnosis not present

## 2022-10-26 DIAGNOSIS — M2550 Pain in unspecified joint: Secondary | ICD-10-CM | POA: Diagnosis not present

## 2022-10-26 DIAGNOSIS — R5383 Other fatigue: Secondary | ICD-10-CM | POA: Diagnosis not present

## 2022-10-29 ENCOUNTER — Ambulatory Visit (INDEPENDENT_AMBULATORY_CARE_PROVIDER_SITE_OTHER): Payer: Medicare Other | Admitting: Family Medicine

## 2022-10-29 VITALS — BP 116/68 | HR 84 | Ht 66.0 in | Wt 160.0 lb

## 2022-10-29 DIAGNOSIS — M533 Sacrococcygeal disorders, not elsewhere classified: Secondary | ICD-10-CM

## 2022-10-29 DIAGNOSIS — M9904 Segmental and somatic dysfunction of sacral region: Secondary | ICD-10-CM | POA: Diagnosis not present

## 2022-10-29 DIAGNOSIS — M9902 Segmental and somatic dysfunction of thoracic region: Secondary | ICD-10-CM | POA: Diagnosis not present

## 2022-10-29 DIAGNOSIS — M9903 Segmental and somatic dysfunction of lumbar region: Secondary | ICD-10-CM | POA: Diagnosis not present

## 2022-10-29 DIAGNOSIS — M25512 Pain in left shoulder: Secondary | ICD-10-CM

## 2022-10-29 DIAGNOSIS — R931 Abnormal findings on diagnostic imaging of heart and coronary circulation: Secondary | ICD-10-CM

## 2022-10-29 DIAGNOSIS — M9901 Segmental and somatic dysfunction of cervical region: Secondary | ICD-10-CM

## 2022-10-29 DIAGNOSIS — M9908 Segmental and somatic dysfunction of rib cage: Secondary | ICD-10-CM | POA: Diagnosis not present

## 2022-10-29 DIAGNOSIS — M25812 Other specified joint disorders, left shoulder: Secondary | ICD-10-CM | POA: Diagnosis not present

## 2022-10-29 DIAGNOSIS — M999 Biomechanical lesion, unspecified: Secondary | ICD-10-CM

## 2022-10-29 NOTE — Patient Instructions (Signed)
Good to see you  Exercises for shoulder given  PT referral placed for [facility]  Zinc '30mg'$  daily Vit D 2000 UI Daily Vit C '1000mg'$  daily  Follow up in 5-6 weeks

## 2022-10-29 NOTE — Assessment & Plan Note (Signed)
Some increase in the tenderness noted.  Patient does have some tightness noted of the left shoulder as well which likely has more of impingement of the shoulder and we will continue to monitor.  Discussed icing regimen and home exercises.  Increase activity slowly.  Follow-up again in 6 to 8 weeks

## 2022-10-29 NOTE — Assessment & Plan Note (Addendum)
Seems to be more management.  Has had long COVID it appears.  Discussed with patient about icing regimen and home exercises.  Discussed which activities to do and which ones to avoid.  Attempted osteopathic manipulation.  Discussed vitamin supplementations.  Total time talking the patient as well as reviewing outside records 33 minutes  Due to the severity of the pain discussed different treatment options and will start with formal physical therapy as well.

## 2022-11-14 ENCOUNTER — Ambulatory Visit: Payer: Medicare Other | Admitting: Internal Medicine

## 2022-11-22 ENCOUNTER — Encounter: Payer: Self-pay | Admitting: Family Medicine

## 2022-11-22 ENCOUNTER — Telehealth (INDEPENDENT_AMBULATORY_CARE_PROVIDER_SITE_OTHER): Payer: Medicare Other | Admitting: Family Medicine

## 2022-11-22 VITALS — BP 116/70 | Ht 66.0 in | Wt 160.0 lb

## 2022-11-22 DIAGNOSIS — Z0000 Encounter for general adult medical examination without abnormal findings: Secondary | ICD-10-CM

## 2022-11-22 NOTE — Progress Notes (Signed)
PATIENT CHECK-IN and HEALTH RISK ASSESSMENT QUESTIONNAIRE:  -completed by phone/video for upcoming Medicare Preventive Visit  Pre-Visit Check-in: 1)Vitals (height, wt, BP, etc) - record in vitals section for visit on day of visit 2)Review and Update Medications, Allergies PMH, Surgeries, Social history in Epic 3)Hospitalizations in the last year with date/reason? No  4)Review and Update Care Team (patient's specialists) in Epic 5) Complete PHQ9 in Epic  6) Complete Fall Screening in Epic 7)Review all Health Maintenance Due and order under PCP if not done.  8)Medicare Wellness Questionnaire: Answer theses question about your habits: Do you drink alcohol? Occasionally If yes, how many drinks do you have a day? 0- a drink once every 3-65month Have you ever smoked?No  How many packs a day do/did you smoke? none Do you use smokeless tobacco? No Do you use an illicit drugs? No Do you exercises? No IF so, what type and how many days/minutes per week? Can't because of long covid.  Are you sexually active? No concerns Vegetables at every meal, chicken, no processed foods, avoids foods that upset the stomach such as eggs and nuts, prediabetic so avoid processed sugars, greenfields pasta   Beverages:  water  Answer theses question about you: Can you perform most household chores? Yes, has a housekepper Do you find it hard to follow a conversation in a noisy room?No Do you often ask people to speak up or repeat themselves? No Do you feel that you have a problem with memory? No Do you balance your checkbook and or bank acounts? Yes Do you feel safe at home? Yes Last dentist visit? 2 months ago Do you need assistance with any of the following: Please note if so No  Driving? No  Feeding yourself? No  Getting from bed to chair? No  Getting to the toilet? No  Bathing or showering? No  Dressing yourself? No  Managing money? No  Climbing a flight of stairs No  Preparing meals? No  Do you  have Advanced Directives in place (Living Will, Healthcare Power or Attorney)? Yes   Last eye Exam and location? August 2023   Do you currently use prescribed or non-prescribed narcotic or opioid pain medications? No  Do you have a history or close family history of breast, ovarian, tubal or peritoneal cancer or a family member with BRCA (breast cancer susceptibility 1 and 2) gene mutations? No  Nurse/Assistant Credentials/time stamp: Valerie Brady CMA  11/22/2022 11:08am   ----------------------------------------------------------------------------------------------------------------------------------------------------------------------------------------------------------------------   MEDICARE ANNUAL PREVENTIVE VISIT WITH PROVIDER: ([facility] wellness or annual wellness exam)  Virtual Visit via Phone Note  I connected with Valerie Brady on 11/22/22 by phone and verified that I am speaking with the correct person using two identifiers.  Location patient: home Location provider:work or home office Persons participating in the virtual visit: patient, provider  Concerns and/or follow up today: suffers from long covid unfortunately. She will be starting to see PT soon as has not been able to exercise due to long covid. Also, is seeing an integrative MD, Valerie Brady Seems to be helping some.  Fasting sugar 1 teens to 130s.   See HM section in Epic for other details of completed HM.    ROS: negative for report of fevers, unintentional weight loss, vision changes, vision loss, hearing loss or change, chest pain, sob, hemoptysis, melena, hematochezia, hematuria, genital discharge or lesions, falls, bleeding or bruising, loc, thoughts of suicide or self harm, memory loss  Patient-completed extensive health risk assessment - reviewed  and discussed with the patient: See Health Risk Assessment completed with patient prior to the visit either above or in recent phone  note. This was reviewed in detailed with the patient today and appropriate recommendations, orders and referrals were placed as needed per Summary below and patient instructions.   Review of Medical History: -PMH, PSH, Family History and current specialty and care providers reviewed and updated and listed below   Patient Care Team: Panosh, Standley Brooking, MD as PCP - General Valerie Campbell, MD (Gastroenterology) Valerie Jacks, MD as Consulting Physician (Ophthalmology)   Past Medical History:  Diagnosis Date   Carotidynia    Diverticulitis of colon 05/22/2015   Gestational diabetes    Hyperlipidemia    Hypertension    Hypothyroidism    Syncope 2011   reaction to benicar    Past Surgical History:  Procedure Laterality Date   cervical polyp removed     FIBULA FRACTURE SURGERY     OPEN REDUCTION AND PINNING OF SPIRAL FX LEFT FIBULA   TONSILLECTOMY     TUBAL LIGATION      Social History   Socioeconomic History   Marital status: Married    Spouse name: Not on file   Number of children: Not on file   Years of education: Not on file   Highest education level: Not on file  Occupational History   Occupation: retired physician    Comment: internist  Tobacco Use   Smoking status: Never   Smokeless tobacco: Never  Vaping Use   Vaping Use: Never used  Substance and Sexual Activity   Alcohol use: Yes    Alcohol/week: 0.0 standard drinks of alcohol    Comment: occ wine   Drug use: No   Sexual activity: Never    Birth control/protection: Abstinence  Other Topics Concern   Not on file  Social History Narrative   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   Neg tobacco    Married   [place] of 5   Pet cat   Mom  Dementia   Spouse memory decline    Social Determinants of Health   Financial Resource Strain: Low Risk  (11/21/2021)   Overall Financial Resource Strain (CARDIA)    Difficulty of Paying Living Expenses: Not hard at all  Food Insecurity: No Food Insecurity (11/21/2021)    Hunger Vital Sign    Worried About Running Out of Food in the Last Year: Never true    [place] in the Last Year: Never true  Transportation Needs: No Transportation Needs (11/21/2021)   PRAPARE - Hydrologist (Medical): No    Lack of Transportation (Non-Medical): No  Physical Activity: Inactive (11/21/2021)   Exercise Vital Sign    Days of Exercise per Week: 0 days    Minutes of Exercise per Session: 0 min  Stress: Stress Concern Present (11/21/2021)   Brenda    Feeling of Stress : To some extent  Social Connections: Not on file  Intimate Partner Violence: Not on file    Family History  Problem Relation Age of Onset   Hypertension Mother    Dementia Mother    Heart disease Father    Dementia Father    Diabetes Father    Hypertension Brother     Current Outpatient Medications on File Prior to Visit  Medication Sig Dispense Refill   Alirocumab (PRALUENT) 150 MG/ML SOAJ Inject 1 Dose into the  skin every 14 (fourteen) days. 2 mL 11   carvedilol (COREG) 12.5 MG tablet TAKE ONE TABLET BY MOUTH TWICE DAILY WITH A MEAL 180 tablet 1   cholecalciferol (VITAMIN D) 1000 units tablet Take 2,000 Units by mouth daily.      COVID-19 mRNA bivalent vaccine, Pfizer, (PFIZER COVID-19 VAC BIVALENT) injection Inject into the muscle. 0.3 mL 0   Cranberry-Vitamin C-Vitamin E (CRANBERRY PLUS VITAMIN C) 4200-20-3 MG-MG-UNIT CAPS Take by mouth.     Cyanocobalamin (B-12 PO) Take 1 tablet by mouth daily. Once weekly     estradiol (ESTRACE) 0.1 MG/GM vaginal cream INSERT 1 gram VAGINALLY 3 TIMES WEEKLY as directed. 42.5 g 0   gabapentin (NEURONTIN) 300 MG capsule TAKE (1) CAPSULE THREE TIMES DAILY,OR AS DIRECTED 90 capsule 0   LANCETS MICRO THIN 33G MISC Use to test blood glucose once daily. 100 each 3   levothyroxine (SYNTHROID) 112 MCG tablet TAKE ONE TABLET BY MOUTH DAILY 90 tablet 3   liothyronine  (CYTOMEL) 5 MCG tablet Take 10 mcg by mouth daily.     Melatonin 5 MG TABS Take 5 mg by mouth at bedtime.     metFORMIN (GLUCOPHAGE) 1000 MG tablet Take 1,000 mg by mouth 2 (two) times daily.     NALTREXONE HCL PO 4.5 mg daily.     Probiotic Product (ALIGN PO) Take by mouth.     TRUE METRIX BLOOD GLUCOSE TEST test strip CHECK BLOOD SUGAR ONCE A DAY. 50 each PRN   ondansetron (ZOFRAN) 4 MG tablet Take 1 tablet (4 mg total) by mouth every 8 (eight) hours as needed for nausea or vomiting. 20 tablet 1   No current facility-administered medications on file prior to visit.    Allergies  Allergen Reactions   Other Other (See Comments)   Bee Venom Swelling   Eggs Or Egg-Derived Products Nausea And Vomiting   Enalapril Maleate     REACTION: Cough   Enalapril Maleate Other (See Comments)    REACTION: Cough   Lactose Diarrhea   Lactose Intolerance (Gi) Diarrhea   Olmesartan Other (See Comments)    REACTION: Gas and chest pain   Olmesartan Medoxomil     REACTION: Gas and chest pain   Almond (Diagnostic) Rash   Almond Oil Rash   Amlodipine Other (See Comments)    Fatigue lethargy at 5 mg dose   Latex Rash   Zetia [Ezetimibe] Other (See Comments)    GI cramps       Physical Exam Vitals:   11/22/22 1101  BP: 116/70   Estimated body mass index is 25.82 kg/m as calculated from the following:   Height as of this encounter: '5\' 6"'$  (1.676 m).   Weight as of this encounter: 160 lb (72.6 kg).  EKG (optional): deferred due to virtual visit  GENERAL: alert, oriented, no audible sounds of distress, vision exam deferred due to virtual visit  PSYCH/NEURO: pleasant and cooperative, no obvious depression or anxiety, speech and thought processing grossly intact, Cognitive function grossly intact  Flowsheet Row Clinical Support from 11/21/2021 in [place] [facility]  PHQ-9 Total Score 6           11/21/2021   10:56 AM 12/26/2020    2:34 PM 08/23/2020    1:29 PM 10/20/2019     9:45 AM 05/01/2017    9:36 AM  Depression screen PHQ 2/9  Decreased Interest 1 0 0 0 2  Down, Depressed, Hopeless 1 0 0 0 2  PHQ - 2  Score 2 0 0 0 4  Altered sleeping 0      Tired, decreased energy 3      Change in appetite 0      Feeling bad or failure about yourself  0      Trouble concentrating 1      Moving slowly or fidgety/restless 0      Suicidal thoughts 0      PHQ-9 Score 6      Difficult doing work/chores Not difficult at all      No severe symptoms, but is tough dealing with chronic disease and several friends with with terminal illness. Does not feel like would benefit from counseling.      10/05/2019    9:30 AM 10/20/2019    9:45 AM 08/23/2020    1:29 PM 12/26/2020    2:34 PM 11/21/2021   10:56 AM  Fall Risk  Falls in the past year? 0 0 0 0 0  Was there an injury with Fall?  0 0 0   Fall Risk Category Calculator  0 0 0   Fall Risk Category  Low Low Low   Patient Fall Risk Level  Low fall risk  Low fall risk Low fall risk  Patient at Risk for Falls Due to     Medication side effect  Fall risk Follow up  Falls evaluation completed  Falls evaluation completed Falls evaluation completed;Education provided;Falls prevention discussed     SUMMARY AND PLAN:  Encounter for Medicare annual wellness exam   Discussed applicable health maintenance/preventive health measures and advised and referred or ordered per patient preferences:  Health Maintenance  Topic Date Due   Hepatitis C Screening  Never done, she does not feel is at risk for this, can request with labs if decides to do   Zoster Vaccines- Shingrix (1 of 2) Never done, discussed, can do at pharmacy or with doctor if decides to do   DEXA SCAN  Never done, declines as does not wish to take treatment if abnormal. Discussed ways to keep bones health - adequate vit D, weight bearing exercise, healthy diet, etc.    FOOT EXAM  10/19/2020, advised can do when sees PCP, has visit scheduled    OPHTHALMOLOGY EXAM   07/07/2021, reports done, found report and updated in epic   HEMOGLOBIN A1C  01/28/2022 plans to do at visit with PCP   Diabetic kidney evaluation - eGFR measurement  08/25/2022 see above   Diabetic kidney evaluation - Urine ACR  08/25/2022 see above   COVID-19 Vaccine (6 - 2023-24 season) utd   Medicare Annual Wellness (AWV)  11/23/2023   DTaP/Tdap/Td (3 - Tdap) 03/05/2024   COLONOSCOPY (Pts 45-34yr Insurance coverage will need to be confirmed)  05/10/2032   Pneumonia Vaccine 77 Years old  Completed   INFLUENZA VACCINE  Completed   HPV VACCINES  Aged Out   Education and counseling on the following was provided based on the above review of health and a plan/checklist for the patient, along with additional information discussed, was provided for the patient in the patient instructions :  -sadly her lifestyle and exercise has been limited by long covid, she will be seeing PT soon and encouraged to take baby steps and start slow with a very slow progression and lots of rest days initially when adding in exercise -discussed healthy diet at length and included guidelines for healthy diet in handout, 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 I am available on Tuesdays and Thursdays for virtual visits if you have any questions or concerns, or if I can be of any further assistance.   CHECKLIST FROM ANNUAL WELLNESS VISIT:  -Follow up (please call to schedule if not scheduled after visit):  -Inperson visit with your Primary Doctor office: as planned - can do diabetes labs, kidney check, foot exam and hepatitis c screen then if you wish -yearly for annual wellness visit with primary care office  Here is a list of your preventive care/health maintenance measures and the plan for each if any are  due:  Health Maintenance  Topic Date Due   Hepatitis C Screening  Never done   Zoster Vaccines- Shingrix (1 of 2) Never done   FOOT EXAM  10/19/2020   HEMOGLOBIN A1C  01/28/2022   Diabetic kidney evaluation - eGFR measurement  08/25/2022   Diabetic kidney evaluation - Urine ACR  08/25/2022   COVID-19 Vaccine (6 - 2023-24 season) 08/13/2023    DEXA SCAN  11/22/2032 declined, please let us know if you ever change your mind about this.    OPHTHALMOLOGY EXAM  08/22/2023   Medicare Annual Wellness (AWV)  11/23/2023   DTaP/Tdap/Td (3 - Tdap) 03/05/2024   COLONOSCOPY (Pts 45-27yr Insurance coverage will need to be confirmed)  05/10/2032   Pneumonia Vaccine 77 Years old  Completed   INFLUENZA VACCINE  Completed   HPV VACCINES  Aged Out    -See a dentist at least yearly  -Get your eyes checked and then per your eye specialist's recommendations  -Other issues addressed today:   -I have included below further information regarding a healthy whole foods based diet, physical activity guidelines for adults, stress management and opportunities for social connections that I provide to everyone. I hope you find this information useful.     NUTRITION: -eat real food: lots of colorful vegetables (half the plate) and fruits -5-7 servings of vegetables and fruits per day (fresh or steamed is best), exp. 2 servings of vegetables with lunch and dinner and 2 servings of fruit per day. Berries and greens such as kale and collards are great choices.  -consume on a regular basis: whole grains (make sure first ingredient on label contains the word "whole"), fresh fruits, fish, nuts, seeds, healthy oils (such as olive oil, avocado oil, grape seed oil) -may eat  small amounts of dairy and lean meat on occasion, but avoid processed meats such as ham, bacon, lunch meat, etc. -drink water -try to avoid fast food and pre-packaged foods, processed meat -most experts advise limiting sodium to < '2300mg'$  per day, should limit further is any chronic conditions such as high blood pressure, heart disease, diabetes, etc. The American Heart Association advised that < '1500mg'$  is is ideal -try to avoid foods that contain any ingredients with names you do not recognize  -try to avoid sugar/sweets (except for the natural sugar that occurs in fresh fruit) -try to avoid sweet drinks -try to avoid white rice, white bread, pasta (unless whole grain), white or yellow potatoes  EXERCISE GUIDELINES FOR ADULTS: -if you wish to increase your physical activity, do so gradually and with the approval of your doctor -STOP and seek medical care immediately if you have any chest pain, chest discomfort or trouble breathing when starting or increasing exercise  -move  and stretch your body, legs, feet and arms when sitting for long periods -Physical activity guidelines for optimal health in adults: -least 150 minutes per week of aerobic exercise (can talk, but not sing) once approved by your doctor, 20-30 minutes of sustained activity or two 10 minute episodes of sustained activity every day.  -resistance training at least 2 days per week if approved by your doctor -balance exercises 3+ days per week:   Stand somewhere where you have something sturdy to hold onto if you lose balance.    1) lift up on toes, start with 5x per day and work up to 20x   2) stand and lift on leg straight out to the side so that foot is a few inches of the floor, start with 5x each side and work up to 20x each side   3) stand on one foot, start with 5 seconds each side and work up to 20 seconds on each side  If you need ideas or help with getting more active:  -Silver  sneakers https://tools.silversneakers.com  -Walk with a Doc: http://stephens-thompson.biz/  -try to include resistance (weight lifting/strength building) and balance exercises twice per week: or the following link for ideas: ChessContest.fr  UpdateClothing.com.cy  STRESS MANAGEMENT: -can try meditating, or just sitting quietly with deep breathing while intentionally relaxing all parts of your body for 5 minutes daily -if you need further help with stress, anxiety or depression please follow up with your primary doctor or contact the wonderful folks at [place]: [place]: -options in [place] if you wish to engage in more social and exercise related activities:  -Silver sneakers https://tools.silversneakers.com  -Walk with a Doc: http://stephens-thompson.biz/  -Check out the [place] 50+ section on the [place] [employer] (hiking clubs, book clubs, cards and games, chess, exercise classes, aquatic classes and much more) - see the website for details: https://www.[place].gov/departments/parks-recreation/active-adults50  -YouTube has lots of exercise videos for different ages and abilities as well  -[place] (a variety of indoor and outdoor inperson activities for adults). [phone]. [address]  -Virtual Online Classes (a variety of topics): see seniorplanet.org or call (314)[phone]  -consider volunteering at a school, hospice center, church, senior center or elsewhere           Valerie Kern, DO

## 2022-11-22 NOTE — Patient Instructions (Addendum)
I really enjoyed getting to talk with you today! I am available on Tuesdays and Thursdays for virtual visits if you have any questions or concerns, or if I can be of any further assistance.   CHECKLIST FROM ANNUAL WELLNESS VISIT:  -Follow up (please call to schedule if not scheduled after visit):  -Inperson visit with your Primary Doctor office: as planned - can do diabetes labs, kidney check, foot exam and hepatitis c screen then if you wish -yearly for annual wellness visit with primary care office  Here is a list of your preventive care/health maintenance measures and the plan for each if any are due:  Health Maintenance  Topic Date Due   Hepatitis C Screening  Never done   Zoster Vaccines- Shingrix (1 of 2) Never done   FOOT EXAM  10/19/2020   HEMOGLOBIN A1C  01/28/2022   Diabetic kidney evaluation - eGFR measurement  08/25/2022   Diabetic kidney evaluation - Urine ACR  08/25/2022   COVID-19 Vaccine (6 - 2023-24 season) 08/13/2023    DEXA SCAN  11/22/2032 declined, please let us know if you ever change your mind about this.    OPHTHALMOLOGY EXAM  08/22/2023   Medicare Annual Wellness (AWV)  11/23/2023   DTaP/Tdap/Td (3 - Tdap) 03/05/2024   COLONOSCOPY (Pts 45-4yr Insurance coverage will need to be confirmed)  05/10/2032   Pneumonia Vaccine 77 Years old  Completed   INFLUENZA VACCINE  Completed   HPV VACCINES  Aged Out    -See a dentist at least yearly  -Get your eyes checked and then per your eye specialist's recommendations  -Other issues addressed today:   -I have included below further information regarding a healthy whole foods based diet, physical activity guidelines for adults, stress management and opportunities for social connections that I provide to everyone. I hope you find this information useful.      NUTRITION: -eat real food: lots of colorful vegetables (half the plate) and fruits -5-7 servings of vegetables and fruits per day (fresh or steamed is best), exp. 2 servings of vegetables with lunch and dinner and 2 servings of fruit per day. Berries and greens such as kale and collards are great choices.  -consume on a regular basis: whole grains (make sure first ingredient on label contains the word "whole"), fresh fruits, fish, nuts, seeds, healthy oils (such as olive oil, avocado oil, grape seed oil) -may eat small amounts of dairy and lean meat on occasion, but avoid processed meats such as ham, bacon, lunch meat, etc. -drink water -try to avoid fast food and pre-packaged foods, processed meat -most experts advise limiting sodium to < '2300mg'$  per day, should limit further is any chronic conditions such as high blood pressure, heart disease, diabetes, etc. The American Heart Association advised that < '1500mg'$  is is ideal -try to avoid foods that contain any ingredients with names you do not recognize  -try to avoid sugar/sweets (except for the natural sugar that occurs in fresh fruit) -try to avoid sweet drinks -try to avoid white rice, white bread, pasta (unless whole grain), white or yellow potatoes  EXERCISE GUIDELINES FOR ADULTS: -if you wish to increase your physical activity, do so gradually and with the approval of your doctor -STOP and seek medical care immediately if you have any chest pain, chest discomfort or trouble breathing when starting or increasing exercise  -move and stretch your body, legs, feet and arms when sitting for long periods -Physical activity guidelines for optimal health in adults: -least  150 minutes per week of  aerobic exercise (can talk, but not sing) once approved by your doctor, 20-30 minutes of sustained activity or two 10 minute episodes of sustained activity every day.  -resistance training at least 2 days per week if approved by your doctor -balance exercises 3+ days per week:   Stand somewhere where you have something sturdy to hold onto if you lose balance.    1) lift up on toes, start with 5x per day and work up to 20x   2) stand and lift on leg straight out to the side so that foot is a few inches of the floor, start with 5x each side and work up to 20x each side   3) stand on one foot, start with 5 seconds each side and work up to 20 seconds on each side  If you need ideas or help with getting more active:  -Silver sneakers https://tools.silversneakers.com  -Walk with a Doc: http://stephens-thompson.biz/  -try to include resistance (weight lifting/strength building) and balance exercises twice per week: or the following link for ideas: ChessContest.fr  UpdateClothing.com.cy  STRESS MANAGEMENT: -can try meditating, or just sitting quietly with deep breathing while intentionally relaxing all parts of your body for 5 minutes daily -if you need further help with stress, anxiety or depression please follow up with your primary doctor or contact the wonderful folks at [place]: [place]: -options in [place] if you wish to engage in more social and exercise related activities:  -Silver sneakers https://tools.silversneakers.com  -Walk with a Doc: http://stephens-thompson.biz/  -Check out the [place] 50+ section on the [place] [employer] (hiking clubs, book clubs, cards and games, chess, exercise classes, aquatic classes and much more) - see the website for  details: https://www.[place].gov/departments/parks-recreation/active-adults50  -YouTube has lots of exercise videos for different ages and abilities as well  -[place] (a variety of indoor and outdoor inperson activities for adults). [phone]. [address]  -Virtual Online Classes (a variety of topics): see seniorplanet.org or call [phone]  -consider volunteering at a school, hospice center, church, senior center or elsewhere

## 2022-11-26 ENCOUNTER — Encounter: Payer: Self-pay | Admitting: Internal Medicine

## 2022-11-27 ENCOUNTER — Other Ambulatory Visit: Payer: Self-pay | Admitting: Internal Medicine

## 2022-11-27 MED ORDER — CARVEDILOL 12.5 MG PO TABS: 12.5000 mg | ORAL_TABLET | Freq: Two times a day (BID) | ORAL | 0 refills | Status: DC

## 2022-12-01 DIAGNOSIS — R350 Frequency of micturition: Secondary | ICD-10-CM | POA: Diagnosis not present

## 2022-12-04 ENCOUNTER — Ambulatory Visit (HOSPITAL_BASED_OUTPATIENT_CLINIC_OR_DEPARTMENT_OTHER): Payer: Medicare Other | Attending: Family Medicine | Admitting: Physical Therapy

## 2022-12-04 ENCOUNTER — Encounter (HOSPITAL_BASED_OUTPATIENT_CLINIC_OR_DEPARTMENT_OTHER): Payer: Self-pay | Admitting: Physical Therapy

## 2022-12-04 ENCOUNTER — Other Ambulatory Visit: Payer: Self-pay

## 2022-12-04 DIAGNOSIS — M25512 Pain in left shoulder: Secondary | ICD-10-CM | POA: Insufficient documentation

## 2022-12-04 DIAGNOSIS — M999 Biomechanical lesion, unspecified: Secondary | ICD-10-CM | POA: Diagnosis not present

## 2022-12-04 DIAGNOSIS — R262 Difficulty in walking, not elsewhere classified: Secondary | ICD-10-CM | POA: Insufficient documentation

## 2022-12-04 DIAGNOSIS — M6281 Muscle weakness (generalized): Secondary | ICD-10-CM | POA: Insufficient documentation

## 2022-12-04 DIAGNOSIS — M546 Pain in thoracic spine: Secondary | ICD-10-CM | POA: Diagnosis not present

## 2022-12-04 NOTE — Therapy (Signed)
OUTPATIENT PHYSICAL THERAPY UPPER EXTREMITY EVALUATION   Patient Name: Valerie Brady MRN: [id number] DOB:Apr 03, 1946, 77 y.o., female Today's Date: 12/04/2022  END OF SESSION:  PT End of Session - 12/04/22 1109     Visit Number 1    Number of Visits 19    Date for PT Re-Evaluation 03/04/23    Authorization Type MCR    PT Start Time 1103    PT Stop Time 1145    PT Time Calculation (min) 42 min    Activity Tolerance Patient tolerated treatment well    Behavior During Therapy [facility] for tasks assessed/performed             Past Medical History:  Diagnosis Date   Carotidynia    Diverticulitis of colon 05/22/2015   Gestational diabetes    Hyperlipidemia    Hypertension    Hypothyroidism    Syncope 2011   reaction to benicar   Past Surgical History:  Procedure Laterality Date   cervical polyp removed     FIBULA FRACTURE SURGERY     OPEN REDUCTION AND PINNING OF SPIRAL [place] LEFT FIBULA   TONSILLECTOMY     TUBAL LIGATION     Patient Active Problem List   Diagnosis Date Noted   Impingement of left shoulder 10/29/2022   SI (sacroiliac) joint dysfunction 08/31/2020   Nonallopathic lesion of sacral region 08/31/2020   Nonallopathic lesion of lumbar region 08/31/2020   Nonallopathic lesion of thoracic region 08/31/2020   Hyperlipidemia associated with type 2 diabetes mellitus ([place]) 03/02/2019   Hyperlipidemia 03/04/2016   IBS (irritable bowel syndrome) 01/16/2015   Essential hypertension 01/16/2015   Unspecified hypothyroidism 03/05/2014   Statin intolerance 03/05/2014   History of recurrent UTIs 06/10/2013   Pre-diabetes 02/10/2013   Hypothyroidism    ADVERSE REACTION TO MEDICATION 05/09/2010   ALLERGIC RHINITIS, SEASONAL, MILD 09/08/2009   CARCINOMA, SKIN, SQUAMOUS CELL 05/03/2009   URINARY INCONTINENCE, URGE, MILD 05/03/2009   HYPERLIPIDEMIA 05/07/2007   HYPERTENSION 05/07/2007   HYPERGLYCEMIA, FASTING 05/07/2007    PCP:  Burnis Medin, MD        REFERRING PROVIDER:   Lyndal Pulley, DO    REFERRING DIAG:  Diagnosis  M99.9 (ICD-10-CM) - Nonallopathic lesion of thoracic region  M25.512 (ICD-10-CM) - Acute pain of left shoulder    THERAPY DIAG:  Pain in thoracic spine - Plan: PT plan of care cert/re-cert  Left shoulder pain, unspecified chronicity - Plan: PT plan of care cert/re-cert  Muscle weakness (generalized) - Plan: PT plan of care cert/re-cert  Difficulty walking - Plan: PT plan of care cert/re-cert  Rationale for Evaluation and Treatment: Rehabilitation  ONSET DATE: 03/2021  SUBJECTIVE:  SUBJECTIVE STATEMENT:  Pt reports her primary concern is the long COVID she has and the neck and shoulder are likely exacerbated by her long term illness. Pt states she has general myalgias and she believes she has a low grade myopathy. She feels like every morning she has aching and throbbing all over.  She has poor sleep and and it hurts to walk/attempt walking exercise. She states that it improves during the day but there is generalized discomfort and pain throughout her pelvis/hips/SIJ region. Pt does states she gets up 1x/every hour to get up and walk around the house- will cause aching. Attempted to exercise but less than 1/4 mile of walking, she not able to stand due to the pain. Pt states the L shoulder feels like the pain is along insertion of deltoid. Does endorse depressive state due to reduction in her function.   Does enjoy being in the water/pool.   Denies cancer red flags. Denies NT.   PERTINENT HISTORY: Long COVID; back pain; brain fog of COVID; DM2  PAIN:  Are you having pain? Yes: NPRS scale: 3/10 Pain location: L shoulder/deltoid, bilat SIJ,  paraspinals, quad/HS,  T/S  Pain description: aching/soreness  Aggravating factors:  exercising too long/ walking, over exertion, IR reaching Relieving factors: Tylenol  PRECAUTIONS: None  WEIGHT BEARING RESTRICTIONS: No  FALLS:  Has patient fallen in last 6 months? No  LIVING ENVIRONMENT: Lives with: lives with their family Lives in: House/apartment Stairs: No  Has following equipment at home: None  OCCUPATION: Retired Corporate investment banker 2x/week week Knits, does puzzles  PLOF: Independent  PATIENT GOALS: return to exercise, reduce pain    OBJECTIVE:   DIAGNOSTIC FINDINGS:  FINDINGS: The sacroiliac joint spaces are maintained and there is no evidence of arthropathy. No other bone abnormalities are seen.   IMPRESSION: Negative exam.  IMPRESSION: Transitional lumbosacral anatomy as described.   Mild degenerative disc disease L1-2 and L2-3. Lower lumbar facet arthropathy noted.   Aortic Atherosclerosis (ICD10-I70.0).  PATIENT SURVEYS :  FOTO 68 66 @ DC   COGNITION: Overall cognitive status: Within functional limits for tasks assessed     SENSATION: WFL  POSTURE: Mild kyphosis and rounded shoulders; no gross pelvic obliquity noted   UPPER EXTREMITY ROM: p! Into L deltoid   Active ROM Right eval Left eval  Shoulder flexion 160 160 p!  Shoulder extension    Shoulder abduction 165 165 p!  Shoulder adduction    Shoulder internal rotation 70 70 p!  Shoulder external rotation 80 80 p!  (Blank rows = not tested)    Thoracic ROM: WFL for all directions except for limitation and stiffness with extension  UPPER EXTREMITY MMT:  MMT Right eval Left eval  Shoulder flexion 4+/5 4/5 p!  Shoulder extension    Shoulder abduction 4+/5 4/5  Shoulder adduction 4+/5 4/5  Shoulder internal rotation 4+/5 4+/5  Shoulder external rotation 4+/5 4/5 p!  Middle trapezius 4+/5 4/5  (Blank rows = not tested)  SHOULDER SPECIAL TESTS: Impingement tests: Neer impingement test: positive , Hawkins/Kennedy impingement test: positive , and  Painful arc test: positive  SLAP lesions: Biceps load test: negative Instability tests: Load and shift test: negative Rotator cuff assessment: Empty can test: positive , Full can test: positive , and External rotation lag sign: negative Biceps assessment: Speed's test: negative  JOINT MOBILITY TESTING:  Stiffness of L compared to R; no excessive laxity or signs of instability  PALPATION:  Globally tender through thoracic and lumbar paraspinals bilat;  TTP of bilat UT, levator, and ant delt/biceps   TODAY'S TREATMENT:                                                                                                                                           DATE: 12/04/22   Exercises - Shoulder External Rotation and Scapular Retraction with Resistance  - 1 x daily - 7 x weekly - 2 sets - 10 reps - Standing Shoulder Horizontal Abduction with Resistance  - 1 x daily - 7 x weekly - 2 sets - 10 reps - Seated Thoracic Lumbar Extension  - 1 x daily - 7 x weekly - 1 sets - 10 reps - 5 hold  PATIENT EDUCATION: Education details: MOI, diagnosis, prognosis, anatomy, exercise progression, DOMS expectations, muscle firing,  envelope of function, HEP, POC   Person educated: Patient Education method: Explanation, Demonstration, Tactile cues, Verbal cues, and Handouts Education comprehension: verbalized understanding, returned demonstration, verbal cues required, and tactile cues required   HOME EXERCISE PROGRAM:    Access Code: [email or web address] URL: [email or web address] Date: 12/04/2022 Prepared by: Daleen Bo     ASSESSMENT:   CLINICAL IMPRESSION: Patient is a 77 year old female being seen and evaluated for physical therapy session due to chief complaint of general deconditioning, thoracic pain, and L shoulder pain.  Patient signs symptoms consistent with general deconditioning in addition to anterior shoulder pain/subacromial pain syndrome.  Shoulder pain likely due to generalized  thoracic stiffness as well as significant left external rotation strength deficit as compared to right.  Patient static positioning throughout her regular day likely contributing to mobility deficits.  Patient's report of myalgias and arthralgias likely due to history of long COVID and will benefit from initial start in aquatic therapy environment before transitioning back onto land based therapy.  Patient's pain is moderately sensitive and irritable at the shoulder all highly irritable with exercise and extended.  Movement of the lower extremity and pelvic girdle.  Pt would benefit from continued skilled therapy in order to reach goals and maximize functional postural strength and global endurance for full return to PLOF.       OBJECTIVE IMPAIRMENTS decreased balance, decreased coordination, decreased endurance, decreased knowledge of use of DME, decreased mobility, difficulty walking, decreased strength, decreased safety awareness, impaired sensation, impaired UE functional use, improper body mechanics, postural dysfunction, obesity, and pain.    ACTIVITY LIMITATIONS carrying, lifting, squatting, stairs, transfers, reach over head, and locomotion level   PARTICIPATION LIMITATIONS: cleaning, laundry, community activity, and yard work   PERSONAL FACTORS Education, Fitness, Past/current experiences, Time since onset of injury/illness/exacerbation, and 1-2 comorbidities are also affecting patient's functional outcome.    REHAB POTENTIAL: good   CLINICAL DECISION MAKING: unstable/complicated   EVALUATION COMPLEXITY: moderate     GOALS:     SHORT TERM GOALS: Target date: 01/15/2023       Pt will become independent with HEP in  order to demonstrate synthesis of PT education.   Goal status: INITIAL  2. Pt will report at least 2 pt reduction on NPRS scale for pain in order to demonstrate functional improvement with household activity, self care, and ADL.   Goal status: INITIAL            LONG TERM GOALS: Target date: 02/26/2023        Pt  will become independent with final HEP in order to demonstrate synthesis of PT education.   Goal status: INITIAL   2.   Pt will be able to reach [facility] and carry/hold >5 lbs in order to demonstrate functional improvement in L UE strength for return to PLOF and exercise.     Goal status: INITIAL   3. Pt will score >/= 66 on FOTO to demonstrate improvement in perceived L shoulder function.    Goal status: INITIAL   4. Pt will be able to demonstrate/report ability to walk >20 mins without pain in order to demonstrate functional improvement and tolerance to exercise and community mobility.  Goal status: INITIAL         PLAN: PT FREQUENCY: 1x/week   PT DURATION: 12 weeks (likely DC in 8-10)   PLANNED INTERVENTIONS: Therapeutic exercises, Therapeutic activity, Neuromuscular re-education, Balance training, Gait training, Patient/Family education, Joint mobilization, Stair training, DME instructions, Aquatic Therapy, Dry Needling, Electrical stimulation, Cryotherapy, Moist heat, Taping, Ultrasound, Ionotophoresis '4mg'$ /ml Dexamethasone, Manual therapy, and Re-evaluation   PLAN FOR NEXT SESSION:  start aquatics: general conditioning and light LE strength/endurance (will need slow progression due to increase in globalized pain with activity); thoracic mobility, L rotator cuff strength    Daleen Bo, PT 12/04/2022, 12:59 PM

## 2022-12-10 NOTE — Progress Notes (Deleted)
Valerie Brady Phone: [phone] Subjective:    I'm seeing this patient by the request  of:  Panosh, Standley Brooking, MD  CC:   [id number]  10/29/2022 Seems to be more management.  Has had long COVID it appears.  Discussed with patient about icing regimen and home exercises.  Discussed which activities to do and which ones to avoid.  Attempted osteopathic manipulation.  Discussed vitamin supplementations.  Total time talking the patient as well as reviewing outside records 33 minutes     Update 12/11/2022 Valerie Brady is a 77 y.o. female coming in with complaint of back and neck pain. OMT 10/29/2022. Also f/u for L shoulder pain. Patient states   Medications patient has been prescribed: None  Taking:         Reviewed prior external information including notes and imaging from previsou exam, outside providers and external EMR if available.   As well as notes that were available from care everywhere and other healthcare systems.  Past medical history, social, surgical and family history all reviewed in electronic medical record.  No pertanent information unless stated regarding to the chief complaint.   Past Medical History:  Diagnosis Date   Carotidynia    Diverticulitis of colon 05/22/2015   Gestational diabetes    Hyperlipidemia    Hypertension    Hypothyroidism    Syncope 2011   reaction to benicar    Allergies  Allergen Reactions   Other Other (See Comments)   Bee Venom Swelling   Eggs Or Egg-Derived Products Nausea And Vomiting   Enalapril Maleate     REACTION: Cough   Enalapril Maleate Other (See Comments)    REACTION: Cough   Lactose Diarrhea   Lactose Intolerance (Gi) Diarrhea   Olmesartan Other (See Comments)    REACTION: Gas and chest pain   Olmesartan Medoxomil     REACTION: Gas and chest pain   Almond (Diagnostic) Rash   Almond Oil Rash   Amlodipine Other (See Comments)     Fatigue lethargy at 5 mg dose   Latex Rash   Zetia [Ezetimibe] Other (See Comments)    GI cramps     Review of Systems:  No headache, visual changes, nausea, vomiting, diarrhea, constipation, dizziness, abdominal pain, skin rash, fevers, chills, night sweats, weight loss, swollen lymph nodes, body aches, joint swelling, chest pain, shortness of breath, mood changes. POSITIVE muscle aches  Objective  There were no vitals taken for this visit.   General: No apparent distress alert and oriented x3 mood and affect normal, dressed appropriately.  HEENT: Pupils equal, extraocular movements intact  Respiratory: Patient's speak in full sentences and does not appear short of breath  Cardiovascular: No lower extremity edema, non tender, no erythema  Gait MSK:  Back   Osteopathic findings  C2 flexed rotated and side bent right C6 flexed rotated and side bent left T3 extended rotated and side bent right inhaled rib T9 extended rotated and side bent left L2 flexed rotated and side bent right Sacrum right on right       Assessment and Plan:  No problem-specific Assessment & Plan notes found for this encounter.    Nonallopathic problems  Decision today to treat with OMT was based on Physical Exam  After verbal consent patient was treated with HVLA, ME, FPR techniques in cervical, rib, thoracic, lumbar, and sacral  areas  Patient tolerated the procedure well with improvement in symptoms  Patient given exercises, stretches and lifestyle modifications  See medications in patient instructions if given  Patient will follow up in 4-8 weeks             Note: This dictation was prepared with Dragon dictation along with smaller phrase technology. Any transcriptional errors that result from this process are unintentional.

## 2022-12-11 ENCOUNTER — Ambulatory Visit: Payer: Medicare Other | Admitting: Family Medicine

## 2022-12-17 ENCOUNTER — Ambulatory Visit: Payer: Medicare Other | Admitting: Internal Medicine

## 2022-12-17 ENCOUNTER — Telehealth: Payer: Self-pay

## 2022-12-17 ENCOUNTER — Encounter (HOSPITAL_BASED_OUTPATIENT_CLINIC_OR_DEPARTMENT_OTHER): Payer: Self-pay | Admitting: Internal Medicine

## 2022-12-17 ENCOUNTER — Other Ambulatory Visit (HOSPITAL_COMMUNITY): Payer: Self-pay

## 2022-12-17 NOTE — Telephone Encounter (Signed)
Pharmacy Patient Advocate Encounter  Prior Authorization for PRALUENT 150 MG/ML  has been approved.    PA# [id number] Effective dates: 12/17/22 through 06/17/23  Karie Soda, [place] Patient Advocate Specialist Direct Number: [phone] Fax: [phone]

## 2022-12-17 NOTE — Telephone Encounter (Signed)
Pharmacy Patient Advocate Encounter   Received notification from [place] that prior authorization for PRALUENT 150 MG/ML is needed.    PA submitted on 12/17/22 Key [id number] Status is pending  Karie Soda, [place] Patient Advocate Specialist Direct Number: [phone] Fax: [phone]

## 2022-12-17 NOTE — Telephone Encounter (Signed)
-----   Message from Fidel Levy, RN sent at 12/17/2022  1:18 PM EST ----- Regarding: praluent PA needed Hello   This patient needs a PA for Praluent Her insurance info is recorded in 12/17/22 "patient message" encounter  Thanks

## 2022-12-18 NOTE — Therapy (Signed)
OUTPATIENT PHYSICAL THERAPY UPPER EXTREMITY EVALUATION   Patient Name: Valerie Brady MRN: [id number] DOB:Jan 23, 1946, 77 y.o., female Today's Date: 12/19/2022  END OF SESSION:  PT End of Session - 12/19/22 1250     Visit Number 2    Number of Visits 19    Date for PT Re-Evaluation 03/04/23    Authorization Type MCR    PT Start Time 1200    PT Stop Time 1238    PT Time Calculation (min) 38 min    Activity Tolerance Patient tolerated treatment well    Behavior During Therapy [facility] for tasks assessed/performed              Past Medical History:  Diagnosis Date   Carotidynia    Diverticulitis of colon 05/22/2015   Gestational diabetes    Hyperlipidemia    Hypertension    Hypothyroidism    Syncope 2011   reaction to benicar   Past Surgical History:  Procedure Laterality Date   cervical polyp removed     FIBULA FRACTURE SURGERY     OPEN REDUCTION AND PINNING OF SPIRAL [place] LEFT FIBULA   TONSILLECTOMY     TUBAL LIGATION     Patient Active Problem List   Diagnosis Date Noted   Impingement of left shoulder 10/29/2022   SI (sacroiliac) joint dysfunction 08/31/2020   Nonallopathic lesion of sacral region 08/31/2020   Nonallopathic lesion of lumbar region 08/31/2020   Nonallopathic lesion of thoracic region 08/31/2020   Hyperlipidemia associated with type 2 diabetes mellitus ([place]) 03/02/2019   Hyperlipidemia 03/04/2016   IBS (irritable bowel syndrome) 01/16/2015   Essential hypertension 01/16/2015   Unspecified hypothyroidism 03/05/2014   Statin intolerance 03/05/2014   History of recurrent UTIs 06/10/2013   Pre-diabetes 02/10/2013   Hypothyroidism    ADVERSE REACTION TO MEDICATION 05/09/2010   ALLERGIC RHINITIS, SEASONAL, MILD 09/08/2009   CARCINOMA, SKIN, SQUAMOUS CELL 05/03/2009   URINARY INCONTINENCE, URGE, MILD 05/03/2009   HYPERLIPIDEMIA 05/07/2007   HYPERTENSION 05/07/2007   HYPERGLYCEMIA, FASTING 05/07/2007    PCP:  Burnis Medin, MD        REFERRING PROVIDER:   Lyndal Pulley, DO    REFERRING DIAG:  Diagnosis  M99.9 (ICD-10-CM) - Nonallopathic lesion of thoracic region  M25.512 (ICD-10-CM) - Acute pain of left shoulder    THERAPY DIAG:  Pain in thoracic spine  Left shoulder pain, unspecified chronicity  Muscle weakness (generalized)  Difficulty walking  Rationale for Evaluation and Treatment: Rehabilitation  ONSET DATE: 03/2021  SUBJECTIVE:  SUBJECTIVE STATEMENT:   "I've been looking forward to this"   Pt reports her primary concern is the long COVID she has and the neck and shoulder are likely exacerbated by her long term illness. Pt states she has general myalgias and she believes she has a low grade myopathy. She feels like every morning she has aching and throbbing all over.  She has poor sleep and and it hurts to walk/attempt walking exercise. She states that it improves during the day but there is generalized discomfort and pain throughout her pelvis/hips/SIJ region. Pt does states she gets up 1x/every hour to get up and walk around the house- will cause aching. Attempted to exercise but less than 1/4 mile of walking, she not able to stand due to the pain. Pt states the L shoulder feels like the pain is along insertion of deltoid. Does endorse depressive state due to reduction in her function.   Does enjoy being in the water/pool.   Denies cancer red flags. Denies NT.   PERTINENT HISTORY: Long COVID; back pain; brain fog of COVID; DM2  PAIN:  Are you having pain? Yes: NPRS scale: 3/10 Pain location: L shoulder/deltoid, bilat SIJ,  paraspinals, quad/HS,  T/S  Pain description: aching/soreness  Aggravating factors: exercising too long/ walking, over exertion, IR reaching Relieving factors: Tylenol  PRECAUTIONS:  None  WEIGHT BEARING RESTRICTIONS: No  FALLS:  Has patient fallen in last 6 months? No  LIVING ENVIRONMENT: Lives with: lives with their family Lives in: House/apartment Stairs: No  Has following equipment at home: None  OCCUPATION: Retired Corporate investment banker 2x/week week Knits, does puzzles  PLOF: Independent  PATIENT GOALS: return to exercise, reduce pain    OBJECTIVE:   DIAGNOSTIC FINDINGS:  FINDINGS: The sacroiliac joint spaces are maintained and there is no evidence of arthropathy. No other bone abnormalities are seen.   IMPRESSION: Negative exam.  IMPRESSION: Transitional lumbosacral anatomy as described.   Mild degenerative disc disease L1-2 and L2-3. Lower lumbar facet arthropathy noted.   Aortic Atherosclerosis (ICD10-I70.0).  PATIENT SURVEYS :  FOTO 68 66 @ DC   COGNITION: Overall cognitive status: Within functional limits for tasks assessed     SENSATION: WFL  POSTURE: Mild kyphosis and rounded shoulders; no gross pelvic obliquity noted   UPPER EXTREMITY ROM: p! Into L deltoid   Active ROM Right eval Left eval  Shoulder flexion 160 160 p!  Shoulder extension    Shoulder abduction 165 165 p!  Shoulder adduction    Shoulder internal rotation 70 70 p!  Shoulder external rotation 80 80 p!  (Blank rows = not tested)    Thoracic ROM: WFL for all directions except for limitation and stiffness with extension  UPPER EXTREMITY MMT:  MMT Right eval Left eval  Shoulder flexion 4+/5 4/5 p!  Shoulder extension    Shoulder abduction 4+/5 4/5  Shoulder adduction 4+/5 4/5  Shoulder internal rotation 4+/5 4+/5  Shoulder external rotation 4+/5 4/5 p!  Middle trapezius 4+/5 4/5  (Blank rows = not tested)  SHOULDER SPECIAL TESTS: Impingement tests: Neer impingement test: positive , Hawkins/Kennedy impingement test: positive , and Painful arc test: positive  SLAP lesions: Biceps load test: negative Instability tests: Load and shift  test: negative Rotator cuff assessment: Empty can test: positive , Full can test: positive , and External rotation lag sign: negative Biceps assessment: Speed's test: negative  JOINT MOBILITY TESTING:  Stiffness of L compared to R; no excessive laxity or signs of instability  PALPATION:  Globally tender through thoracic and lumbar paraspinals bilat; TTP of bilat UT, levator, and ant delt/biceps   TODAY'S TREATMENT:    Pt seen for aquatic therapy today.  Treatment took place in water 3.25-4.5 ft in depth at the [facility]. Temp of water was 91.  Pt entered/exited the pool via stairs using alternating pattern with hand rail.  *Intro to setting *Walking with white barbell forward 6 widths, backwards 2 widths -seated rest period *Side stepping x 6 widths -seated rest period *Seated cycling *Standing ue horizontal add/abd; abd/add 0-90d *seated rest period   Pt requires the buoyancy and hydrostatic pressure of water for support, and to offload joints by unweighting joint load by at least 50 % in navel deep water and by at least 75-80% in chest to neck deep water.  Viscosity of the water is needed for resistance of strengthening. Water current perturbations provides challenge to standing balance requiring increased core activation.                                                                                                                                          DATE: 12/04/22   Exercises - Shoulder External Rotation and Scapular Retraction with Resistance  - 1 x daily - 7 x weekly - 2 sets - 10 reps - Standing Shoulder Horizontal Abduction with Resistance  - 1 x daily - 7 x weekly - 2 sets - 10 reps - Seated Thoracic Lumbar Extension  - 1 x daily - 7 x weekly - 1 sets - 10 reps - 5 hold  PATIENT EDUCATION: Education details: MOI, diagnosis, prognosis, anatomy, exercise progression, DOMS expectations, muscle firing,  envelope of function, HEP, POC   Person  educated: Patient Education method: Explanation, Demonstration, Tactile cues, Verbal cues, and Handouts Education comprehension: verbalized understanding, returned demonstration, verbal cues required, and tactile cues required   HOME EXERCISE PROGRAM:    Access Code: [email or web address] URL: [email or web address] Date: 12/04/2022 Prepared by: Daleen Bo     ASSESSMENT:   CLINICAL IMPRESSION:  Pt demonstrates safety and indep in setting with therapist instructing from deck.  Initiated therapy with walking in all directions. She is cued to slow pace.  She is able to tolerate ~6 widths before needing seated rest periods due to quads beginning to ache. All rest periods are seated.  Session cut short to 38 minutes due to anticipated fatigue from activity.  Discussed progression of water therapy to possible include use of wc transport to and from if fatigue level too high until activity toleration improves.  She is in agreement.  Plan to increase activity as tolerated.     Patient is a 77 year old female being seen and evaluated for physical therapy session due to chief complaint of general deconditioning, thoracic pain, and L shoulder pain.  Patient signs symptoms consistent with general deconditioning in addition to anterior shoulder pain/subacromial pain syndrome.  Shoulder  pain likely due to generalized thoracic stiffness as well as significant left external rotation strength deficit as compared to right.  Patient static positioning throughout her regular day likely contributing to mobility deficits.  Patient's report of myalgias and arthralgias likely due to history of long COVID and will benefit from initial start in aquatic therapy environment before transitioning back onto land based therapy.  Patient's pain is moderately sensitive and irritable at the shoulder all highly irritable with exercise and extended.  Movement of the lower extremity and pelvic girdle.  Pt would benefit from continued  skilled therapy in order to reach goals and maximize functional postural strength and global endurance for full return to PLOF.       OBJECTIVE IMPAIRMENTS decreased balance, decreased coordination, decreased endurance, decreased knowledge of use of DME, decreased mobility, difficulty walking, decreased strength, decreased safety awareness, impaired sensation, impaired UE functional use, improper body mechanics, postural dysfunction, obesity, and pain.    ACTIVITY LIMITATIONS carrying, lifting, squatting, stairs, transfers, reach over head, and locomotion level   PARTICIPATION LIMITATIONS: cleaning, laundry, community activity, and yard work   PERSONAL FACTORS Education, Fitness, Past/current experiences, Time since onset of injury/illness/exacerbation, and 1-2 comorbidities are also affecting patient's functional outcome.    REHAB POTENTIAL: good   CLINICAL DECISION MAKING: unstable/complicated   EVALUATION COMPLEXITY: moderate     GOALS:     SHORT TERM GOALS: Target date: 01/15/2023       Pt will become independent with HEP in order to demonstrate synthesis of PT education.   Goal status: INITIAL  2. Pt will report at least 2 pt reduction on NPRS scale for pain in order to demonstrate functional improvement with household activity, self care, and ADL.   Goal status: INITIAL           LONG TERM GOALS: Target date: 02/26/2023        Pt  will become independent with final HEP in order to demonstrate synthesis of PT education.   Goal status: INITIAL   2.   Pt will be able to reach [facility] and carry/hold >5 lbs in order to demonstrate functional improvement in L UE strength for return to PLOF and exercise.     Goal status: INITIAL   3. Pt will score >/= 66 on FOTO to demonstrate improvement in perceived L shoulder function.    Goal status: INITIAL   4. Pt will be able to demonstrate/report ability to walk >20 mins without pain in order to demonstrate functional improvement  and tolerance to exercise and community mobility.  Goal status: INITIAL         PLAN: PT FREQUENCY: 1x/week   PT DURATION: 12 weeks (likely DC in 8-10)   PLANNED INTERVENTIONS: Therapeutic exercises, Therapeutic activity, Neuromuscular re-education, Balance training, Gait training, Patient/Family education, Joint mobilization, Stair training, DME instructions, Aquatic Therapy, Dry Needling, Electrical stimulation, Cryotherapy, Moist heat, Taping, Ultrasound, Ionotophoresis '4mg'$ /ml Dexamethasone, Manual therapy, and Re-evaluation   PLAN FOR NEXT SESSION:  start aquatics: general conditioning and light LE strength/endurance (will need slow progression due to increase in globalized pain with activity); thoracic mobility, L rotator cuff strength    Denton Meek, PT 12/19/2022, 12:51 PM

## 2022-12-19 ENCOUNTER — Encounter (HOSPITAL_BASED_OUTPATIENT_CLINIC_OR_DEPARTMENT_OTHER): Payer: Self-pay | Admitting: Physical Therapy

## 2022-12-19 ENCOUNTER — Ambulatory Visit (HOSPITAL_BASED_OUTPATIENT_CLINIC_OR_DEPARTMENT_OTHER): Payer: Medicare Other | Attending: Family Medicine | Admitting: Physical Therapy

## 2022-12-19 DIAGNOSIS — M546 Pain in thoracic spine: Secondary | ICD-10-CM | POA: Insufficient documentation

## 2022-12-19 DIAGNOSIS — M25512 Pain in left shoulder: Secondary | ICD-10-CM | POA: Insufficient documentation

## 2022-12-19 DIAGNOSIS — R262 Difficulty in walking, not elsewhere classified: Secondary | ICD-10-CM | POA: Insufficient documentation

## 2022-12-19 DIAGNOSIS — M6281 Muscle weakness (generalized): Secondary | ICD-10-CM | POA: Insufficient documentation

## 2022-12-20 ENCOUNTER — Encounter: Payer: Self-pay | Admitting: Internal Medicine

## 2022-12-20 ENCOUNTER — Ambulatory Visit (INDEPENDENT_AMBULATORY_CARE_PROVIDER_SITE_OTHER): Payer: Medicare Other | Admitting: Internal Medicine

## 2022-12-20 ENCOUNTER — Telehealth: Payer: Self-pay

## 2022-12-20 VITALS — BP 120/76 | HR 76 | Temp 98.0°F | Ht 66.0 in | Wt 162.8 lb

## 2022-12-20 DIAGNOSIS — E118 Type 2 diabetes mellitus with unspecified complications: Secondary | ICD-10-CM | POA: Diagnosis not present

## 2022-12-20 DIAGNOSIS — U099 Post covid-19 condition, unspecified: Secondary | ICD-10-CM | POA: Diagnosis not present

## 2022-12-20 DIAGNOSIS — Z79899 Other long term (current) drug therapy: Secondary | ICD-10-CM | POA: Diagnosis not present

## 2022-12-20 DIAGNOSIS — E785 Hyperlipidemia, unspecified: Secondary | ICD-10-CM

## 2022-12-20 DIAGNOSIS — I10 Essential (primary) hypertension: Secondary | ICD-10-CM | POA: Diagnosis not present

## 2022-12-20 DIAGNOSIS — Z7409 Other reduced mobility: Secondary | ICD-10-CM

## 2022-12-20 LAB — POCT GLYCOSYLATED HEMOGLOBIN (HGB A1C): Hemoglobin A1C: 6.5 % — AB (ref 4.0–5.6)

## 2022-12-20 MED ORDER — METFORMIN HCL 1000 MG PO TABS: 1000.0000 mg | ORAL_TABLET | Freq: Two times a day (BID) | ORAL | 1 refills | Status: DC

## 2022-12-20 MED ORDER — ESTRADIOL 0.1 MG/GM VA CREA: TOPICAL_CREAM | VAGINAL | 6 refills | Status: DC

## 2022-12-20 MED ORDER — CARVEDILOL 12.5 MG PO TABS: 12.5000 mg | ORAL_TABLET | Freq: Two times a day (BID) | ORAL | 3 refills | Status: DC

## 2022-12-20 MED ORDER — ONDANSETRON HCL 4 MG PO TABS: 4.0000 mg | ORAL_TABLET | Freq: Three times a day (TID) | ORAL | 1 refills | Status: AC | PRN

## 2022-12-20 NOTE — Progress Notes (Signed)
Chief Complaint  Patient presents with   Medical Management of Chronic Issues    HPI: Valerie Brady 77 y.o. come in for Chronic disease management    HT  ok  cannot take ace or arb on carvedilol  DM  bouncing  BG .  Max in high 100s  Meds  need refill estradiol carvedilol metformin  ondansetron when gets per vertiginous sx  Brain fog  and  intermittent  vertigo like   and nausea .    And muscle aches .  And fatigue Felt to be  long covid .   Recent PT.  May be helpful  gets myalgias if exercising a lot  No  neuropathy.  HLD on praluent?  Hilty immproved on meds  seen 9 23  metformin  and praluent  Gu recurrent utis  using estradiol cream Tsh normal  range per integrative  person in [place]    Trying cocunut oil supplement  tid not as bad bad days.   And methyl folate. Per integrative medicine  also Align for IBS takine b12 a few times per week and levels were high in past  ROS: See pertinent positives and negatives per HPI.  Past Medical History:  Diagnosis Date   Carotidynia    Diverticulitis of colon 05/22/2015   Gestational diabetes    Hyperlipidemia    Hypertension    Hypothyroidism    Syncope 2011   reaction to benicar    Family History  Problem Relation Age of Onset   Hypertension Mother    Dementia Mother    Heart disease Father    Dementia Father    Diabetes Father    Hypertension Brother     Social History   Socioeconomic History   Marital status: Married    Spouse name: Not on file   Number of children: Not on file   Years of education: Not on file   Highest education level: Professional school degree (e.g., MD, DDS, DVM, JD)  Occupational History   Occupation: retired physician    Comment: internist  Tobacco Use   Smoking status: Never   Smokeless tobacco: Never  Vaping Use   Vaping Use: Never used  Substance and Sexual Activity   Alcohol use: Yes    Alcohol/week: 0.0 standard drinks of alcohol    Comment: occ wine   Drug use: No    Sexual activity: Never    Birth control/protection: Abstinence  Other Topics Concern   Not on file  Social History Narrative   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   Neg tobacco    Married   [place] of 5   Pet cat   Mom  Dementia   Spouse memory decline    Social Determinants of Health   Financial Resource Strain: [place]  (12/17/2022)   Overall Financial Resource Strain (CARDIA)    Difficulty of Paying Living Expenses: Not hard at all  Food Insecurity: No Food Insecurity (12/17/2022)   Hunger Vital Sign    Worried About Running Out of Food in the Last Year: Never true    [place] in the Last Year: Never true  Transportation Needs: No Transportation Needs (12/17/2022)   PRAPARE - Hydrologist (Medical): No    Lack of Transportation (Non-Medical): No  Physical Activity: Unknown (12/17/2022)   Exercise Vital Sign    Days of Exercise per Week: 0 days    Minutes of Exercise per Session:  Not on file  Stress: No Stress Concern Present (12/17/2022)   [place]    Feeling of Stress : Only a little  Social Connections: Moderately Isolated (12/17/2022)   Social Connection and Isolation Panel [NHANES]    Frequency of Communication with Friends and Family: More than three times a week    Frequency of Social Gatherings with Friends and Family: Twice a week    Attends Religious Services: Never    Marine scientist or Organizations: No    Attends Music therapist: Not on file    Marital Status: Married    Outpatient Medications Prior to Visit  Medication Sig Dispense Refill   Alirocumab (PRALUENT) 150 MG/ML SOAJ Inject 1 Dose into the skin every 14 (fourteen) days. 2 mL 11   cholecalciferol (VITAMIN D) 1000 units tablet Take 2,000 Units by mouth daily.      COVID-19 mRNA bivalent vaccine, Pfizer, (PFIZER COVID-19 VAC BIVALENT) injection Inject into the muscle. 0.3 mL 0    Cranberry-Vitamin C-Vitamin E (CRANBERRY PLUS VITAMIN C) 4200-20-3 MG-MG-UNIT CAPS Take by mouth.     Cyanocobalamin (B-12 PO) Take 1 tablet by mouth daily. Once weekly     gabapentin (NEURONTIN) 300 MG capsule TAKE (1) CAPSULE THREE TIMES DAILY,OR AS DIRECTED 90 capsule 0   LANCETS MICRO THIN 33G MISC Use to test blood glucose once daily. 100 each 3   levothyroxine (SYNTHROID) 112 MCG tablet TAKE ONE TABLET BY MOUTH DAILY 90 tablet 3   liothyronine (CYTOMEL) 5 MCG tablet Take 10 mcg by mouth daily.     Melatonin 5 MG TABS Take 5 mg by mouth at bedtime.     NALTREXONE HCL PO 4.5 mg daily.     Probiotic Product (ALIGN PO) Take by mouth.     TRUE METRIX BLOOD GLUCOSE TEST test strip CHECK BLOOD SUGAR ONCE A DAY. 50 each PRN   carvedilol (COREG) 12.5 MG tablet Take 1 tablet (12.5 mg total) by mouth 2 (two) times daily with a meal. KEEP SCHEDULED APPT FOR FUTURE REFILLS 60 tablet 0   estradiol (ESTRACE) 0.1 MG/GM vaginal cream INSERT 1 gram VAGINALLY 3 TIMES WEEKLY as directed. 42.5 g 0   metFORMIN (GLUCOPHAGE) 1000 MG tablet Take 1,000 mg by mouth 2 (two) times daily.     ondansetron (ZOFRAN) 4 MG tablet Take 1 tablet (4 mg total) by mouth every 8 (eight) hours as needed for nausea or vomiting. 20 tablet 1   No facility-administered medications prior to visit.     EXAM:  BP 120/76 (BP Location: Right Arm, Patient Position: Sitting, Cuff Size: Normal)   Pulse 76   Temp 98 F (36.7 C) (Oral)   Ht '5\' 6"'$  (1.676 m)   Wt 162 lb 12.8 oz (73.8 kg)   SpO2 96%   BMI 26.28 kg/m   Body mass index is 26.28 kg/m.  GENERAL: vitals reviewed and listed above, alert, oriented, appears well hydrated and in no acute distress HEENT: atraumatic, conjunctiva  clear, no obvious abnormalities on inspection of external nose and ears  NECK: no obvious masses on inspection palpation  LUNGS: clear to auscultation bilaterally, no wheezes, rales or rhonchi, good air movement CV: HRRR, no clubbing cyanosis or   peripheral edema nl cap refill  MS: moves all extremities without noticeable focal  abnormality PSYCH: pleasant and cooperative, no obvious depression or anxiety Diabetic Foot Exam - Simple   Simple Foot Form Visual Inspection No deformities, no  ulcerations, no other skin breakdown bilaterally: Yes Sensation Testing Intact to touch and monofilament testing bilaterally: Yes Pulse Check Posterior Tibialis and Dorsalis pulse intact bilaterally: Yes Comments     Lab Results  Component Value Date   WBC 8.6 08/25/2021   HGB 13.2 08/25/2021   HCT 40.2 08/25/2021   PLT 392.0 08/25/2021   GLUCOSE 124 (H) 08/25/2021   CHOL 312 (H) 08/25/2021   TRIG 317.0 (H) 08/25/2021   HDL 49.30 08/25/2021   LDLDIRECT 197.0 08/25/2021   LDLCALC 174 (H) 02/27/2016   ALT 12 08/25/2021   AST 12 08/25/2021   NA 136 08/25/2021   K 3.9 08/25/2021   CL 103 08/25/2021   CREATININE 0.78 08/25/2021   BUN 16 08/25/2021   CO2 25 08/25/2021   TSH 2.43 08/25/2021   HGBA1C 6.5 (A) 12/20/2022   MICROALBUR <0.7 08/25/2021   BP Readings from Last 3 Encounters:  12/20/22 120/76  11/22/22 116/70  10/29/22 116/68    ASSESSMENT AND PLAN:  Discussed the following assessment and plan:  Controlled type 2 diabetes mellitus with complication, without long-term current use of insulin (HCC) - a1c 6.5 today - Plan: POC HgB A1c, Comprehensive metabolic panel  Hyperlipidemia, unspecified hyperlipidemia type - Plan: Comprehensive metabolic panel  Medication management - Plan: Comprehensive metabolic panel  Essential hypertension - Plan: Comprehensive metabolic panel  Post-COVID chronic decreased mobility and endurance - post viral syndrome  ms predominant Labs done elsewhere not in system  but nmr lipoprootein a year  fu ( fall 24 ) thyroid in range reported  A1c and  cmp today   Will refill meds as discussed  -Patient advised to return or notify health care team  if  new concerns arise.  Patient Instructions   Good to see you today .  A1c is 6.5   CMP  today  Keep [place] informed  any  labs report  . Standley Brooking. Marshall Roehrich M.D.

## 2022-12-20 NOTE — Telephone Encounter (Signed)
Patient was seen today. She filled out [employer] form.  Form placed in Dr. Velora Mediate red folder.

## 2022-12-20 NOTE — Patient Instructions (Addendum)
Good to see you today .  A1c is 6.5   CMP  today  Keep [place] informed  any  labs report  .

## 2022-12-24 ENCOUNTER — Encounter (HOSPITAL_BASED_OUTPATIENT_CLINIC_OR_DEPARTMENT_OTHER): Payer: Self-pay | Admitting: Physical Therapy

## 2022-12-24 ENCOUNTER — Ambulatory Visit (HOSPITAL_BASED_OUTPATIENT_CLINIC_OR_DEPARTMENT_OTHER): Payer: Medicare Other | Admitting: Physical Therapy

## 2022-12-24 DIAGNOSIS — R262 Difficulty in walking, not elsewhere classified: Secondary | ICD-10-CM

## 2022-12-24 DIAGNOSIS — M6281 Muscle weakness (generalized): Secondary | ICD-10-CM | POA: Diagnosis not present

## 2022-12-24 DIAGNOSIS — M546 Pain in thoracic spine: Secondary | ICD-10-CM

## 2022-12-24 DIAGNOSIS — M25512 Pain in left shoulder: Secondary | ICD-10-CM

## 2022-12-24 NOTE — Therapy (Signed)
OUTPATIENT PHYSICAL THERAPY UPPER EXTREMITY EVALUATION   Patient Name: Valerie Brady MRN: [id number] DOB:February 15, 1946, 77 y.o., female Today's Date: 12/24/2022  END OF SESSION:  PT End of Session - 12/24/22 1118     Visit Number 3    Number of Visits 19    Date for PT Re-Evaluation 03/04/23    Authorization Type MCR    PT Start Time 1116    PT Stop Time 1200    PT Time Calculation (min) 44 min    Activity Tolerance Patient tolerated treatment well    Behavior During Therapy [facility] for tasks assessed/performed              Past Medical History:  Diagnosis Date   Carotidynia    Diverticulitis of colon 05/22/2015   Gestational diabetes    Hyperlipidemia    Hypertension    Hypothyroidism    Syncope 2011   reaction to benicar   Past Surgical History:  Procedure Laterality Date   cervical polyp removed     FIBULA FRACTURE SURGERY     OPEN REDUCTION AND PINNING OF SPIRAL [place] LEFT FIBULA   TONSILLECTOMY     TUBAL LIGATION     Patient Active Problem List   Diagnosis Date Noted   Impingement of left shoulder 10/29/2022   SI (sacroiliac) joint dysfunction 08/31/2020   Nonallopathic lesion of sacral region 08/31/2020   Nonallopathic lesion of lumbar region 08/31/2020   Nonallopathic lesion of thoracic region 08/31/2020   Hyperlipidemia associated with type 2 diabetes mellitus ([place]) 03/02/2019   Hyperlipidemia 03/04/2016   IBS (irritable bowel syndrome) 01/16/2015   Essential hypertension 01/16/2015   Unspecified hypothyroidism 03/05/2014   Statin intolerance 03/05/2014   History of recurrent UTIs 06/10/2013   Pre-diabetes 02/10/2013   Hypothyroidism    ADVERSE REACTION TO MEDICATION 05/09/2010   ALLERGIC RHINITIS, SEASONAL, MILD 09/08/2009   CARCINOMA, SKIN, SQUAMOUS CELL 05/03/2009   URINARY INCONTINENCE, URGE, MILD 05/03/2009   HYPERLIPIDEMIA 05/07/2007   HYPERTENSION 05/07/2007   HYPERGLYCEMIA, FASTING 05/07/2007    PCP:  Burnis Medin, MD        REFERRING PROVIDER:   Lyndal Pulley, DO    REFERRING DIAG:  Diagnosis  M99.9 (ICD-10-CM) - Nonallopathic lesion of thoracic region  M25.512 (ICD-10-CM) - Acute pain of left shoulder    THERAPY DIAG:  Pain in thoracic spine  Left shoulder pain, unspecified chronicity  Muscle weakness (generalized)  Difficulty walking  Rationale for Evaluation and Treatment: Rehabilitation  ONSET DATE: 03/2021  SUBJECTIVE:  SUBJECTIVE STATEMENT:   "Felt pretty good after last session felt "normal" after a workout"   Pt reports her primary concern is the long COVID she has and the neck and shoulder are likely exacerbated by her long term illness. Pt states she has general myalgias and she believes she has a low grade myopathy. She feels like every morning she has aching and throbbing all over.  She has poor sleep and and it hurts to walk/attempt walking exercise. She states that it improves during the day but there is generalized discomfort and pain throughout her pelvis/hips/SIJ region. Pt does states she gets up 1x/every hour to get up and walk around the house- will cause aching. Attempted to exercise but less than 1/4 mile of walking, she not able to stand due to the pain. Pt states the L shoulder feels like the pain is along insertion of deltoid. Does endorse depressive state due to reduction in her function.   Does enjoy being in the water/pool.   Denies cancer red flags. Denies NT.   PERTINENT HISTORY: Long COVID; back pain; brain fog of COVID; DM2  PAIN:  Are you having pain? Yes: NPRS scale: 0/10 Pain location: L shoulder/deltoid, bilat SIJ,  paraspinals, quad/HS,  T/S  Pain description: aching/soreness  Aggravating factors: exercising too long/ walking, over exertion, IR reaching Relieving  factors: Tylenol  PRECAUTIONS: None  WEIGHT BEARING RESTRICTIONS: No  FALLS:  Has patient fallen in last 6 months? No  LIVING ENVIRONMENT: Lives with: lives with their family Lives in: House/apartment Stairs: No  Has following equipment at home: None  OCCUPATION: Retired Corporate investment banker 2x/week week Knits, does puzzles  PLOF: Independent  PATIENT GOALS: return to exercise, reduce pain    OBJECTIVE:   DIAGNOSTIC FINDINGS:  FINDINGS: The sacroiliac joint spaces are maintained and there is no evidence of arthropathy. No other bone abnormalities are seen.   IMPRESSION: Negative exam.  IMPRESSION: Transitional lumbosacral anatomy as described.   Mild degenerative disc disease L1-2 and L2-3. Lower lumbar facet arthropathy noted.   Aortic Atherosclerosis (ICD10-I70.0).  PATIENT SURVEYS :  FOTO 68 66 @ DC   COGNITION: Overall cognitive status: Within functional limits for tasks assessed     SENSATION: WFL  POSTURE: Mild kyphosis and rounded shoulders; no gross pelvic obliquity noted   UPPER EXTREMITY ROM: p! Into L deltoid   Active ROM Right eval Left eval  Shoulder flexion 160 160 p!  Shoulder extension    Shoulder abduction 165 165 p!  Shoulder adduction    Shoulder internal rotation 70 70 p!  Shoulder external rotation 80 80 p!  (Blank rows = not tested)    Thoracic ROM: WFL for all directions except for limitation and stiffness with extension  UPPER EXTREMITY MMT:  MMT Right eval Left eval  Shoulder flexion 4+/5 4/5 p!  Shoulder extension    Shoulder abduction 4+/5 4/5  Shoulder adduction 4+/5 4/5  Shoulder internal rotation 4+/5 4+/5  Shoulder external rotation 4+/5 4/5 p!  Middle trapezius 4+/5 4/5  (Blank rows = not tested)  SHOULDER SPECIAL TESTS: Impingement tests: Neer impingement test: positive , Hawkins/Kennedy impingement test: positive , and Painful arc test: positive  SLAP lesions: Biceps load test:  negative Instability tests: Load and shift test: negative Rotator cuff assessment: Empty can test: positive , Full can test: positive , and External rotation lag sign: negative Biceps assessment: Speed's test: negative  JOINT MOBILITY TESTING:  Stiffness of L compared to R; no excessive laxity or  signs of instability  PALPATION:  Globally tender through thoracic and lumbar paraspinals bilat; TTP of bilat UT, levator, and ant delt/biceps   TODAY'S TREATMENT:    Pt seen for aquatic therapy today.  Treatment took place in water 3.25-4.5 ft in depth at the [facility]. Temp of water was 91.  Pt entered/exited the pool via stairs using alternating pattern with hand rail.  *Walking without UE support forward 6 widths, backwards 2 widths -seated rest period *STS from bench onto step with eccentric control. Cues for TrA bracing, weight through heels x 10 *cycling on noodle 2 widths; breast stroke arms x 2 widths -rest period between and after *Sitting balance on noodle for core engagement. *UE horizontal add/abd; resisted shoulder shrugging with 1 foam hand buoy. -rest period    Pt requires the buoyancy and hydrostatic pressure of water for support, and to offload joints by unweighting joint load by at least 50 % in navel deep water and by at least 75-80% in chest to neck deep water.  Viscosity of the water is needed for resistance of strengthening. Water current perturbations provides challenge to standing balance requiring increased core activation.                                                                                                                                          DATE: 12/04/22   Exercises - Shoulder External Rotation and Scapular Retraction with Resistance  - 1 x daily - 7 x weekly - 2 sets - 10 reps - Standing Shoulder Horizontal Abduction with Resistance  - 1 x daily - 7 x weekly - 2 sets - 10 reps - Seated Thoracic Lumbar Extension  - 1 x daily - 7  x weekly - 1 sets - 10 reps - 5 hold  PATIENT EDUCATION: Education details: MOI, diagnosis, prognosis, anatomy, exercise progression, DOMS expectations, muscle firing,  envelope of function, HEP, POC   Person educated: Patient Education method: Explanation, Demonstration, Tactile cues, Verbal cues, and Handouts Education comprehension: verbalized understanding, returned demonstration, verbal cues required, and tactile cues required   HOME EXERCISE PROGRAM:    Access Code: [email or web address] URL: [email or web address] Date: 12/04/2022 Prepared by: Daleen Bo     ASSESSMENT:   CLINICAL IMPRESSION:  Pt reports good response to first aquatic session without increased pain or added fatigue.  Progressed today with exercised focused on movement in different patterns from first visit with continued monitoring of fatigue level and pain.  She tolerates without difficultly.  VC for modifying intensity as we progress for improved toleration. Multiple rest periods needed.   Initial impression: Patient is a 77 year old female being seen and evaluated for physical therapy session due to chief complaint of general deconditioning, thoracic pain, and L shoulder pain.  Patient signs symptoms consistent with general deconditioning in addition to anterior shoulder pain/subacromial pain syndrome.  Shoulder pain likely due to generalized thoracic  stiffness as well as significant left external rotation strength deficit as compared to right.  Patient static positioning throughout her regular day likely contributing to mobility deficits.  Patient's report of myalgias and arthralgias likely due to history of long COVID and will benefit from initial start in aquatic therapy environment before transitioning back onto land based therapy.  Patient's pain is moderately sensitive and irritable at the shoulder all highly irritable with exercise and extended.  Movement of the lower extremity and pelvic girdle.  Pt would  benefit from continued skilled therapy in order to reach goals and maximize functional postural strength and global endurance for full return to PLOF.       OBJECTIVE IMPAIRMENTS decreased balance, decreased coordination, decreased endurance, decreased knowledge of use of DME, decreased mobility, difficulty walking, decreased strength, decreased safety awareness, impaired sensation, impaired UE functional use, improper body mechanics, postural dysfunction, obesity, and pain.    ACTIVITY LIMITATIONS carrying, lifting, squatting, stairs, transfers, reach over head, and locomotion level   PARTICIPATION LIMITATIONS: cleaning, laundry, community activity, and yard work   PERSONAL FACTORS Education, Fitness, Past/current experiences, Time since onset of injury/illness/exacerbation, and 1-2 comorbidities are also affecting patient's functional outcome.    REHAB POTENTIAL: good   CLINICAL DECISION MAKING: unstable/complicated   EVALUATION COMPLEXITY: moderate     GOALS:     SHORT TERM GOALS: Target date: 01/15/2023       Pt will become independent with HEP in order to demonstrate synthesis of PT education.   Goal status: INITIAL  2. Pt will report at least 2 pt reduction on NPRS scale for pain in order to demonstrate functional improvement with household activity, self care, and ADL.   Goal status: INITIAL           LONG TERM GOALS: Target date: 02/26/2023        Pt  will become independent with final HEP in order to demonstrate synthesis of PT education.   Goal status: INITIAL   2.   Pt will be able to reach [facility] and carry/hold >5 lbs in order to demonstrate functional improvement in L UE strength for return to PLOF and exercise.     Goal status: INITIAL   3. Pt will score >/= 66 on FOTO to demonstrate improvement in perceived L shoulder function.    Goal status: INITIAL   4. Pt will be able to demonstrate/report ability to walk >20 mins without pain in order to demonstrate  functional improvement and tolerance to exercise and community mobility.  Goal status: INITIAL         PLAN: PT FREQUENCY: 1x/week   PT DURATION: 12 weeks (likely DC in 8-10)   PLANNED INTERVENTIONS: Therapeutic exercises, Therapeutic activity, Neuromuscular re-education, Balance training, Gait training, Patient/Family education, Joint mobilization, Stair training, DME instructions, Aquatic Therapy, Dry Needling, Electrical stimulation, Cryotherapy, Moist heat, Taping, Ultrasound, Ionotophoresis 58m/ml Dexamethasone, Manual therapy, and Re-evaluation   PLAN FOR NEXT SESSION:  start aquatics: general conditioning and light LE strength/endurance (will need slow progression due to increase in globalized pain with activity); thoracic mobility, L rotator cuff strength    FDenton Meek PT MPT 12/24/2022, 1:39 PM

## 2022-12-25 NOTE — Telephone Encounter (Signed)
Form complete and back to you Please make a notation in patient record  as this record is patients  mother

## 2022-12-28 ENCOUNTER — Ambulatory Visit (HOSPITAL_BASED_OUTPATIENT_CLINIC_OR_DEPARTMENT_OTHER): Payer: Medicare Other | Admitting: Physical Therapy

## 2022-12-28 ENCOUNTER — Encounter (HOSPITAL_BASED_OUTPATIENT_CLINIC_OR_DEPARTMENT_OTHER): Payer: Self-pay | Admitting: Physical Therapy

## 2022-12-28 DIAGNOSIS — M6281 Muscle weakness (generalized): Secondary | ICD-10-CM

## 2022-12-28 DIAGNOSIS — M25512 Pain in left shoulder: Secondary | ICD-10-CM | POA: Diagnosis not present

## 2022-12-28 DIAGNOSIS — M546 Pain in thoracic spine: Secondary | ICD-10-CM

## 2022-12-28 DIAGNOSIS — R262 Difficulty in walking, not elsewhere classified: Secondary | ICD-10-CM

## 2022-12-28 NOTE — Telephone Encounter (Signed)
Contacted pt. Message was noted on daughter's  chart.

## 2022-12-28 NOTE — Therapy (Signed)
OUTPATIENT PHYSICAL THERAPY UPPER EXTREMITY TREATMENT   Patient Name: Valerie Brady MRN: [id number] DOB:Jan 22, 1946, 77 y.o., female Today's Date: 12/28/2022  END OF SESSION:  PT End of Session - 12/28/22 1346     Visit Number 4    Number of Visits 19    Date for PT Re-Evaluation 03/04/23    Authorization Type MCR    PT Start Time [id number]    PT Stop Time 1425    PT Time Calculation (min) 40 min    Behavior During Therapy [facility] for tasks assessed/performed              Past Medical History:  Diagnosis Date   Carotidynia    Diverticulitis of colon 05/22/2015   Gestational diabetes    Hyperlipidemia    Hypertension    Hypothyroidism    Syncope 2011   reaction to benicar   Past Surgical History:  Procedure Laterality Date   cervical polyp removed     FIBULA FRACTURE SURGERY     OPEN REDUCTION AND PINNING OF SPIRAL [place] LEFT FIBULA   TONSILLECTOMY     TUBAL LIGATION     Patient Active Problem List   Diagnosis Date Noted   Impingement of left shoulder 10/29/2022   SI (sacroiliac) joint dysfunction 08/31/2020   Nonallopathic lesion of sacral region 08/31/2020   Nonallopathic lesion of lumbar region 08/31/2020   Nonallopathic lesion of thoracic region 08/31/2020   Hyperlipidemia associated with type 2 diabetes mellitus ([place]) 03/02/2019   Hyperlipidemia 03/04/2016   IBS (irritable bowel syndrome) 01/16/2015   Essential hypertension 01/16/2015   Unspecified hypothyroidism 03/05/2014   Statin intolerance 03/05/2014   History of recurrent UTIs 06/10/2013   Pre-diabetes 02/10/2013   Hypothyroidism    ADVERSE REACTION TO MEDICATION 05/09/2010   ALLERGIC RHINITIS, SEASONAL, MILD 09/08/2009   CARCINOMA, SKIN, SQUAMOUS CELL 05/03/2009   URINARY INCONTINENCE, URGE, MILD 05/03/2009   HYPERLIPIDEMIA 05/07/2007   HYPERTENSION 05/07/2007   HYPERGLYCEMIA, FASTING 05/07/2007    PCP:  Burnis Medin, MD       REFERRING PROVIDER:   Lyndal Pulley, DO     REFERRING DIAG:  Diagnosis  M99.9 (ICD-10-CM) - Nonallopathic lesion of thoracic region  M25.512 (ICD-10-CM) - Acute pain of left shoulder    THERAPY DIAG:  Pain in thoracic spine  Left shoulder pain, unspecified chronicity  Muscle weakness (generalized)  Difficulty walking  Rationale for Evaluation and Treatment: Rehabilitation  ONSET DATE: 03/2021  SUBJECTIVE:  SUBJECTIVE STATEMENT:   Pt reports no pain, just brain fog". She states that she stopped doing the band exercises from HEP once she started aquatics.   PERTINENT HISTORY: Long COVID; back pain; brain fog of COVID; DM2  PAIN:  Are you having pain? Yes: NPRS scale: 0/10 Pain location: L shoulder/deltoid, bilat SIJ,  paraspinals, quad/HS,  T/S  Pain description: aching/soreness  Aggravating factors: exercising too long/ walking, over exertion, IR reaching Relieving factors: Tylenol  PRECAUTIONS: None  WEIGHT BEARING RESTRICTIONS: No  FALLS:  Has patient fallen in last 6 months? No  LIVING ENVIRONMENT: Lives with: lives with their family Lives in: House/apartment Stairs: No  Has following equipment at home: None  OCCUPATION: Retired Corporate investment banker 2x/week week Knits, does puzzles  PLOF: Independent  PATIENT GOALS: return to exercise, reduce pain    OBJECTIVE:   DIAGNOSTIC FINDINGS:  FINDINGS: The sacroiliac joint spaces are maintained and there is no evidence of arthropathy. No other bone abnormalities are seen.   IMPRESSION: Negative exam.  IMPRESSION: Transitional lumbosacral anatomy as described.   Mild degenerative disc disease L1-2 and L2-3. Lower lumbar facet arthropathy noted.   Aortic Atherosclerosis (ICD10-I70.0).  PATIENT SURVEYS :  FOTO 68 66 @ DC   COGNITION: Overall cognitive  status: Within functional limits for tasks assessed     SENSATION: WFL  POSTURE: Mild kyphosis and rounded shoulders; no gross pelvic obliquity noted   UPPER EXTREMITY ROM: p! Into L deltoid   Active ROM Right eval Left eval  Shoulder flexion 160 160 p!  Shoulder extension    Shoulder abduction 165 165 p!  Shoulder adduction    Shoulder internal rotation 70 70 p!  Shoulder external rotation 80 80 p!  (Blank rows = not tested)    Thoracic ROM: WFL for all directions except for limitation and stiffness with extension  UPPER EXTREMITY MMT:  MMT Right eval Left eval  Shoulder flexion 4+/5 4/5 p!  Shoulder extension    Shoulder abduction 4+/5 4/5  Shoulder adduction 4+/5 4/5  Shoulder internal rotation 4+/5 4+/5  Shoulder external rotation 4+/5 4/5 p!  Middle trapezius 4+/5 4/5  (Blank rows = not tested)  SHOULDER SPECIAL TESTS: Impingement tests: Neer impingement test: positive , Hawkins/Kennedy impingement test: positive , and Painful arc test: positive  SLAP lesions: Biceps load test: negative Instability tests: Load and shift test: negative Rotator cuff assessment: Empty can test: positive , Full can test: positive , and External rotation lag sign: negative Biceps assessment: Speed's test: negative  JOINT MOBILITY TESTING:  Stiffness of L compared to R; no excessive laxity or signs of instability  PALPATION:  Globally tender through thoracic and lumbar paraspinals bilat; TTP of bilat UT, levator, and ant delt/biceps   TODAY'S TREATMENT:   12/28/22 Pt seen for aquatic therapy today.  Treatment took place in water 3.25-4.5 ft in depth at the [facility]. Temp of water was 91.  Pt entered/exited the pool via stairs using alternating pattern with hand rail.  *Walking without UE support forward/backward 3 laps, side stepping without/with arms x 3 laps * short seated rest period; seated cycling  * Standing - TrA set with short noodle pull down to  thighs x 10;  bilat shoulder addct with rainbow hand x 8; bilat horiz addct/abdct x 10 with rainbow hand floats on surface * return to walking without support, forward/backward and marching  * SLS with LE circles (alternating LEs as needed) x 7-8 total on each side  *cycling  on noodle with breast stroke arms * holding corner, letting LE suspend for short rest break; then cc ski legs and hip abdct/addct * restful back float (no assist) x 2 min  Pt requires the buoyancy and hydrostatic pressure of water for support, and to offload joints by unweighting joint load by at least 50 % in navel deep water and by at least 75-80% in chest to neck deep water.  Viscosity of the water is needed for resistance of strengthening. Water current perturbations provides challenge to standing balance requiring increased core activation.  PATIENT EDUCATION: Education details: aquatics progressions and modifications Person educated: Patient Education method: Education officer, environmental, Corporate treasurer cues, Verbal cues, and Handouts Education comprehension: verbalized understanding, returned demonstration, verbal cues required, and tactile cues required   HOME EXERCISE PROGRAM:    Access Code: [place]: [email or web address] Date: 12/04/2022 Prepared by: Daleen Bo  - Shoulder External Rotation and Scapular Retraction with Resistance  - 1 x daily - 7 x weekly - 2 sets - 10 reps - Standing Shoulder Horizontal Abduction with Resistance  - 1 x daily - 7 x weekly - 2 sets - 10 reps - Seated Thoracic Lumbar Extension  - 1 x daily - 7 x weekly - 1 sets - 10 reps - 5 hold   ASSESSMENT:   CLINICAL IMPRESSION:  Pt reports good response to first aquatic session without increased pain or prolonged fatigue. Progressed exercised with use of hand floats for UE resistance. Continued monitoring of fatigue level and pain. 2 short rest periods needed this date. Pt decided to trial back float at end of session on her  own accord; she looked at ease during floatation, but reported dizziness upon returning upright.  Pt would benefit from continued skilled therapy in order to reach goals and maximize functional postural strength and global endurance for full return to PLOF.       OBJECTIVE IMPAIRMENTS decreased balance, decreased coordination, decreased endurance, decreased knowledge of use of DME, decreased mobility, difficulty walking, decreased strength, decreased safety awareness, impaired sensation, impaired UE functional use, improper body mechanics, postural dysfunction, obesity, and pain.    ACTIVITY LIMITATIONS carrying, lifting, squatting, stairs, transfers, reach over head, and locomotion level   PARTICIPATION LIMITATIONS: cleaning, laundry, community activity, and yard work   PERSONAL FACTORS Education, Fitness, Past/current experiences, Time since onset of injury/illness/exacerbation, and 1-2 comorbidities are also affecting patient's functional outcome.    REHAB POTENTIAL: good   CLINICAL DECISION MAKING: unstable/complicated   EVALUATION COMPLEXITY: moderate     GOALS:     SHORT TERM GOALS: Target date: 01/15/2023       Pt will become independent with HEP in order to demonstrate synthesis of PT education.   Goal status: INITIAL  2. Pt will report at least 2 pt reduction on NPRS scale for pain in order to demonstrate functional improvement with household activity, self care, and ADL.   Goal status: INITIAL           LONG TERM GOALS: Target date: 02/26/2023        Pt  will become independent with final HEP in order to demonstrate synthesis of PT education.   Goal status: INITIAL   2.   Pt will be able to reach [facility] and carry/hold >5 lbs in order to demonstrate functional improvement in L UE strength for return to PLOF and exercise.     Goal status: INITIAL   3. Pt will score >/= 66 on FOTO to demonstrate improvement in perceived L shoulder  function.    Goal status:  INITIAL   4. Pt will be able to demonstrate/report ability to walk >20 mins without pain in order to demonstrate functional improvement and tolerance to exercise and community mobility.  Goal status: INITIAL         PLAN: PT FREQUENCY: 1x/week   PT DURATION: 12 weeks (likely DC in 8-10)   PLANNED INTERVENTIONS: Therapeutic exercises, Therapeutic activity, Neuromuscular re-education, Balance training, Gait training, Patient/Family education, Joint mobilization, Stair training, DME instructions, Aquatic Therapy, Dry Needling, Electrical stimulation, Cryotherapy, Moist heat, Taping, Ultrasound, Ionotophoresis 54m/ml Dexamethasone, Manual therapy, and Re-evaluation   PLAN FOR NEXT SESSION:  start aquatics: general conditioning and light LE strength/endurance (will need slow progression due to increase in globalized pain with activity); thoracic mobility, L rotator cuff strength   JKerin Perna PTA 12/28/22 4:46 PM [facility] [address]GMer[address]ville [place] [address]Phone: [phone]  Fax:  [phone]

## 2023-01-01 ENCOUNTER — Ambulatory Visit (HOSPITAL_BASED_OUTPATIENT_CLINIC_OR_DEPARTMENT_OTHER): Payer: Medicare Other | Admitting: Physical Therapy

## 2023-01-01 NOTE — Progress Notes (Unsigned)
Valerie Brady Valerie Brady [address] [place] Phone: [phone] Subjective:   Valerie Brady, am serving as a scribe for Dr. Hulan Saas.  I'm seeing this patient by the request  of:  Panosh, Standley Brooking, MD  CC: Back and neck pain follow-up  [id number]  Valerie Brady is a 77 y.o. female coming in with complaint of back and neck pain. OMT on 10/29/2022. Patient states doing well. PT is working and feeling better. Here for manipulation. No other concerns.  Patient has been able to make significant strides she states with physical therapy.  Has been able to increase activity as well as the endurance which was the most difficult portion that she was having trouble with.  Still not perfect but continues to make improvement.  Medications patient has been prescribed:   Taking:         Reviewed prior external information including notes and imaging from previsou exam, outside providers and external EMR if available.   As well as notes that were available from care everywhere and other healthcare systems.  Past medical history, social, surgical and family history all reviewed in electronic medical record.  No pertanent information unless stated regarding to the chief complaint.   Past Medical History:  Diagnosis Date   Carotidynia    Diverticulitis of colon 05/22/2015   Gestational diabetes    Hyperlipidemia    Hypertension    Hypothyroidism    Syncope 2011   reaction to benicar    Allergies  Allergen Reactions   Other Other (See Comments)   Bee Venom Swelling   Eggs Or Egg-Derived Products Nausea And Vomiting   Enalapril Maleate     REACTION: Cough   Enalapril Maleate Other (See Comments)    REACTION: Cough   Lactose Diarrhea   Lactose Intolerance (Gi) Diarrhea   Olmesartan Other (See Comments)    REACTION: Gas and chest pain   Olmesartan Medoxomil     REACTION: Gas and chest pain   Almond (Diagnostic) Rash   Almond Oil Rash    Amlodipine Other (See Comments)    Fatigue lethargy at 5 mg dose   Latex Rash   Zetia [Ezetimibe] Other (See Comments)    GI cramps     Review of Systems:  No headache, visual changes, nausea, vomiting, diarrhea, constipation, dizziness, abdominal pain, skin rash, fevers, chills, night sweats, weight loss, swollen lymph nodes, body aches, joint swelling, chest pain, shortness of breath, mood changes. POSITIVE muscle aches  Objective  Blood pressure 130/72, pulse 85, height 5' 6"$  (1.676 m), weight 165 lb (74.8 kg), SpO2 96 %.   General: No apparent distress alert and oriented x3 mood and affect normal, dressed appropriately.  HEENT: Pupils equal, extraocular movements intact  Respiratory: Patient's speak in full sentences and does not appear short of breath  Cardiovascular: No lower extremity edema, non tender, no erythema  Low back exam does have some loss lordosis.  Tightness noted mostly in the paraspinal musculature in the scapular area.  More over the sacroiliac joint as well noted.  Still significant improvement in range of motion with no radicular symptoms and significant decrease in the hypoechoic changes.  Osteopathic findings  T3 extended rotated and side bent right inhaled rib T4 extended rotated and side bent left L4 flexed rotated and side bent right Sacrum right on right       Assessment and Plan:  SI (sacroiliac) joint dysfunction Continuing to work for strengthening noted.  Patient has had difficulty but is making progress in physical therapy for strengthening and endurance.  Does feel that some of the increase in discomfort is secondary to long COVID.  We discussed with patient and encouraged her to continue to make the strides that she is making.  Follow-up with me again in 6 to 8 weeks.    Nonallopathic problems  Decision today to treat with OMT was based on Physical Exam  After verbal consent patient was treated with HVLA, ME, FPR techniques in  rib,  thoracic, lumbar, and sacral  areas  Patient tolerated the procedure well with improvement in symptoms  Patient given exercises, stretches and lifestyle modifications  See medications in patient instructions if given  Patient will follow up in 4-8 weeks     The above documentation has been reviewed and is accurate and complete Lyndal Pulley, DO         Note: This dictation was prepared with Dragon dictation along with smaller phrase technology. Any transcriptional errors that result from this process are unintentional.

## 2023-01-02 ENCOUNTER — Encounter: Payer: Self-pay | Admitting: Internal Medicine

## 2023-01-02 ENCOUNTER — Ambulatory Visit (INDEPENDENT_AMBULATORY_CARE_PROVIDER_SITE_OTHER): Payer: Medicare Other | Admitting: Family Medicine

## 2023-01-02 ENCOUNTER — Ambulatory Visit (HOSPITAL_BASED_OUTPATIENT_CLINIC_OR_DEPARTMENT_OTHER): Payer: Medicare Other | Admitting: Physical Therapy

## 2023-01-02 ENCOUNTER — Other Ambulatory Visit: Payer: Self-pay | Admitting: Internal Medicine

## 2023-01-02 ENCOUNTER — Encounter (HOSPITAL_BASED_OUTPATIENT_CLINIC_OR_DEPARTMENT_OTHER): Payer: Self-pay | Admitting: Physical Therapy

## 2023-01-02 VITALS — BP 130/72 | HR 85 | Ht 66.0 in | Wt 165.0 lb

## 2023-01-02 DIAGNOSIS — M546 Pain in thoracic spine: Secondary | ICD-10-CM | POA: Diagnosis not present

## 2023-01-02 DIAGNOSIS — M25512 Pain in left shoulder: Secondary | ICD-10-CM | POA: Diagnosis not present

## 2023-01-02 DIAGNOSIS — M9903 Segmental and somatic dysfunction of lumbar region: Secondary | ICD-10-CM

## 2023-01-02 DIAGNOSIS — M9908 Segmental and somatic dysfunction of rib cage: Secondary | ICD-10-CM | POA: Diagnosis not present

## 2023-01-02 DIAGNOSIS — M9904 Segmental and somatic dysfunction of sacral region: Secondary | ICD-10-CM | POA: Diagnosis not present

## 2023-01-02 DIAGNOSIS — M6281 Muscle weakness (generalized): Secondary | ICD-10-CM | POA: Diagnosis not present

## 2023-01-02 DIAGNOSIS — R262 Difficulty in walking, not elsewhere classified: Secondary | ICD-10-CM | POA: Diagnosis not present

## 2023-01-02 DIAGNOSIS — M9902 Segmental and somatic dysfunction of thoracic region: Secondary | ICD-10-CM

## 2023-01-02 DIAGNOSIS — M533 Sacrococcygeal disorders, not elsewhere classified: Secondary | ICD-10-CM

## 2023-01-02 NOTE — Assessment & Plan Note (Signed)
Continuing to work for strengthening noted.  Patient has had difficulty but is making progress in physical therapy for strengthening and endurance.  Does feel that some of the increase in discomfort is secondary to long COVID.  We discussed with patient and encouraged her to continue to make the strides that she is making.  Follow-up with me again in 6 to 8 weeks.

## 2023-01-02 NOTE — Therapy (Signed)
OUTPATIENT PHYSICAL THERAPY UPPER EXTREMITY TREATMENT   Patient Name: Valerie Brady MRN: [id number] DOB:[birth year], 77 y.o., female Today's Date: 01/02/2023  END OF SESSION:  PT End of Session - 01/02/23 1030     Visit Number 5    Number of Visits 19    Date for PT Re-Evaluation 03/04/23    Authorization Type MCR    PT Start Time 1031    PT Stop Time 1115    PT Time Calculation (min) 44 min    Activity Tolerance Patient tolerated treatment well    Behavior During Therapy [facility] for tasks assessed/performed              Past Medical History:  Diagnosis Date   Carotidynia    Diverticulitis of colon 05/22/2015   Gestational diabetes    Hyperlipidemia    Hypertension    Hypothyroidism    Syncope 2011   reaction to benicar   Past Surgical History:  Procedure Laterality Date   cervical polyp removed     FIBULA FRACTURE SURGERY     OPEN REDUCTION AND PINNING OF SPIRAL [place] LEFT FIBULA   TONSILLECTOMY     TUBAL LIGATION     Patient Active Problem List   Diagnosis Date Noted   Impingement of left shoulder 10/29/2022   SI (sacroiliac) joint dysfunction 08/31/2020   Nonallopathic lesion of sacral region 08/31/2020   Nonallopathic lesion of lumbar region 08/31/2020   Nonallopathic lesion of thoracic region 08/31/2020   Hyperlipidemia associated with type 2 diabetes mellitus ([place]) 03/02/2019   Hyperlipidemia 03/04/2016   IBS (irritable bowel syndrome) 01/16/2015   Essential hypertension 01/16/2015   Unspecified hypothyroidism 03/05/2014   Statin intolerance 03/05/2014   History of recurrent UTIs 06/10/2013   Pre-diabetes 02/10/2013   Hypothyroidism    ADVERSE REACTION TO MEDICATION 05/09/2010   ALLERGIC RHINITIS, SEASONAL, MILD 09/08/2009   CARCINOMA, SKIN, SQUAMOUS CELL 05/03/2009   URINARY INCONTINENCE, URGE, MILD 05/03/2009   HYPERLIPIDEMIA 05/07/2007   HYPERTENSION 05/07/2007   HYPERGLYCEMIA, FASTING 05/07/2007    PCP:  Burnis Medin, MD        REFERRING PROVIDER:   Lyndal Pulley, DO    REFERRING DIAG:  Diagnosis  M99.9 (ICD-10-CM) - Nonallopathic lesion of thoracic region  M25.512 (ICD-10-CM) - Acute pain of left shoulder    THERAPY DIAG:  Pain in thoracic spine  Left shoulder pain, unspecified chronicity  Muscle weakness (generalized)  Difficulty walking  Rationale for Evaluation and Treatment: Rehabilitation  ONSET DATE: 03/2021  SUBJECTIVE:  SUBJECTIVE STATEMENT:   Pt reports improving strength and endurance.  No residual issues after last session   PERTINENT HISTORY: Long COVID; back pain; brain fog of COVID; DM2  PAIN:  Are you having pain? Yes: NPRS scale: 0/10 Pain location: L shoulder/deltoid, bilat SIJ,  paraspinals, quad/HS,  T/S  Pain description: aching/soreness  Aggravating factors: exercising too long/ walking, over exertion, IR reaching Relieving factors: Tylenol  PRECAUTIONS: None  WEIGHT BEARING RESTRICTIONS: No  FALLS:  Has patient fallen in last 6 months? No  LIVING ENVIRONMENT: Lives with: lives with their family Lives in: House/apartment Stairs: No  Has following equipment at home: None  OCCUPATION: Retired Corporate investment banker 2x/week week Knits, does puzzles  PLOF: Independent  PATIENT GOALS: return to exercise, reduce pain    OBJECTIVE:   DIAGNOSTIC FINDINGS:  FINDINGS: The sacroiliac joint spaces are maintained and there is no evidence of arthropathy. No other bone abnormalities are seen.   IMPRESSION: Negative exam.  IMPRESSION: Transitional lumbosacral anatomy as described.   Mild degenerative disc disease L1-2 and L2-3. Lower lumbar facet arthropathy noted.   Aortic Atherosclerosis (ICD10-I70.0).  PATIENT SURVEYS :  FOTO 68 66 @ DC   COGNITION: Overall  cognitive status: Within functional limits for tasks assessed     SENSATION: WFL  POSTURE: Mild kyphosis and rounded shoulders; no gross pelvic obliquity noted   UPPER EXTREMITY ROM: p! Into L deltoid   Active ROM Right eval Left eval  Shoulder flexion 160 160 p!  Shoulder extension    Shoulder abduction 165 165 p!  Shoulder adduction    Shoulder internal rotation 70 70 p!  Shoulder external rotation 80 80 p!  (Blank rows = not tested)    Thoracic ROM: WFL for all directions except for limitation and stiffness with extension  UPPER EXTREMITY MMT:  MMT Right eval Left eval  Shoulder flexion 4+/5 4/5 p!  Shoulder extension    Shoulder abduction 4+/5 4/5  Shoulder adduction 4+/5 4/5  Shoulder internal rotation 4+/5 4+/5  Shoulder external rotation 4+/5 4/5 p!  Middle trapezius 4+/5 4/5  (Blank rows = not tested)  SHOULDER SPECIAL TESTS: Impingement tests: Neer impingement test: positive , Hawkins/Kennedy impingement test: positive , and Painful arc test: positive  SLAP lesions: Biceps load test: negative Instability tests: Load and shift test: negative Rotator cuff assessment: Empty can test: positive , Full can test: positive , and External rotation lag sign: negative Biceps assessment: Speed's test: negative  JOINT MOBILITY TESTING:  Stiffness of L compared to R; no excessive laxity or signs of instability  PALPATION:  Globally tender through thoracic and lumbar paraspinals bilat; TTP of bilat UT, levator, and ant delt/biceps   TODAY'S TREATMENT:   01/02/23 Pt seen for aquatic therapy today.  Treatment took place in water 3.25-4.5 ft in depth at the [facility]. Temp of water was 91.  Pt entered/exited the pool via stairs using alternating pattern with hand rail.  *Walking without UE support forward x 12 widths (4 consecutive minutes) with reports of quad fatigue  -  short seated rest period * Standing - TrA set with short noodle pull down to  thighs 2x 10 wide stance then staggered L/R;  bilat shoulder addct with rainbow hand x 10; bilat horiz addct/abdct x 10 with rainbow hand floats on surface *sitting balance on solid noodle x 5 minutes for core engagement  - standing rest period *side stepping 1 foam hand buoys submerged to side x 2 widths; w/ some  lunging x 2 widths; with ue add/abd x 2 width  - standing rest period *Sup then prone suspension with yellow noodle wrapped around chest: breast stroke arms and legs x 2 lengths; hip add/abd   Pt requires the buoyancy and hydrostatic pressure of water for support, and to offload joints by unweighting joint load by at least 50 % in navel deep water and by at least 75-80% in chest to neck deep water.  Viscosity of the water is needed for resistance of strengthening. Water current perturbations provides challenge to standing balance requiring increased core activation.  PATIENT EDUCATION: Education details: aquatics progressions and modifications Person educated: Patient Education method: Education officer, environmental, Corporate treasurer cues, Verbal cues, and Handouts Education comprehension: verbalized understanding, returned demonstration, verbal cues required, and tactile cues required   HOME EXERCISE PROGRAM:    Access Code: [place]: [email or web address] Date: 12/04/2022 Prepared by: Daleen Bo  - Shoulder External Rotation and Scapular Retraction with Resistance  - 1 x daily - 7 x weekly - 2 sets - 10 reps - Standing Shoulder Horizontal Abduction with Resistance  - 1 x daily - 7 x weekly - 2 sets - 10 reps - Seated Thoracic Lumbar Extension  - 1 x daily - 7 x weekly - 1 sets - 10 reps - 5 hold   ASSESSMENT:   CLINICAL IMPRESSION: Continued good response from sessions.  She reports feeling improvements in strength and less fatigue. She requires only 1 seated rest period today although is given 2-3 very short standing rest periods. Good core engagement with most  exercises in various positions. She is enjoying sessions as well as progressing well towards stated goals.         OBJECTIVE IMPAIRMENTS decreased balance, decreased coordination, decreased endurance, decreased knowledge of use of DME, decreased mobility, difficulty walking, decreased strength, decreased safety awareness, impaired sensation, impaired UE functional use, improper body mechanics, postural dysfunction, obesity, and pain.    ACTIVITY LIMITATIONS carrying, lifting, squatting, stairs, transfers, reach over head, and locomotion level   PARTICIPATION LIMITATIONS: cleaning, laundry, community activity, and yard work   PERSONAL FACTORS Education, Fitness, Past/current experiences, Time since onset of injury/illness/exacerbation, and 1-2 comorbidities are also affecting patient's functional outcome.    REHAB POTENTIAL: good   CLINICAL DECISION MAKING: unstable/complicated   EVALUATION COMPLEXITY: moderate     GOALS:     SHORT TERM GOALS: Target date: 01/15/2023       Pt will become independent with HEP in order to demonstrate synthesis of PT education.   Goal status: INITIAL  2. Pt will report at least 2 pt reduction on NPRS scale for pain in order to demonstrate functional improvement with household activity, self care, and ADL.   Goal status: INITIAL           LONG TERM GOALS: Target date: 02/26/2023        Pt  will become independent with final HEP in order to demonstrate synthesis of PT education.   Goal status: INITIAL   2.   Pt will be able to reach [facility] and carry/hold >5 lbs in order to demonstrate functional improvement in L UE strength for return to PLOF and exercise.     Goal status: INITIAL   3. Pt will score >/= 66 on FOTO to demonstrate improvement in perceived L shoulder function.    Goal status: INITIAL   4. Pt will be able to demonstrate/report ability to walk >20 mins without pain in order to demonstrate functional improvement and tolerance to  exercise and community mobility.  Goal status: INITIAL         PLAN: PT FREQUENCY: 1x/week   PT DURATION: 12 weeks (likely DC in 8-10)   PLANNED INTERVENTIONS: Therapeutic exercises, Therapeutic activity, Neuromuscular re-education, Balance training, Gait training, Patient/Family education, Joint mobilization, Stair training, DME instructions, Aquatic Therapy, Dry Needling, Electrical stimulation, Cryotherapy, Moist heat, Taping, Ultrasound, Ionotophoresis 81m/ml Dexamethasone, Manual therapy, and Re-evaluation   PLAN FOR NEXT SESSION:  start aquatics: general conditioning and light LE strength/endurance (will need slow progression due to increase in globalized pain with activity); thoracic mobility, L rotator cuff strength  MStanton Kidney(Tharon Aquas Latiya Navia MPT 01/02/23 10:32 AM [facility] [address] [address]Phone: [phone]  Fax:  [phone]

## 2023-01-02 NOTE — Patient Instructions (Signed)
Great job Lets push out to 2 months Proud of what you're doing See you again in 2 months

## 2023-01-04 ENCOUNTER — Ambulatory Visit (HOSPITAL_BASED_OUTPATIENT_CLINIC_OR_DEPARTMENT_OTHER): Payer: Medicare Other | Admitting: Physical Therapy

## 2023-01-04 DIAGNOSIS — M6281 Muscle weakness (generalized): Secondary | ICD-10-CM | POA: Diagnosis not present

## 2023-01-04 DIAGNOSIS — M546 Pain in thoracic spine: Secondary | ICD-10-CM | POA: Diagnosis not present

## 2023-01-04 DIAGNOSIS — R262 Difficulty in walking, not elsewhere classified: Secondary | ICD-10-CM | POA: Diagnosis not present

## 2023-01-04 DIAGNOSIS — M25512 Pain in left shoulder: Secondary | ICD-10-CM

## 2023-01-04 NOTE — Therapy (Signed)
OUTPATIENT PHYSICAL THERAPY UPPER EXTREMITY TREATMENT   Patient Name: Valerie Brady MRN: [id number] DOB:07/08/1946, 77 y.o., female Today's Date: 01/04/2023  END OF SESSION:  PT End of Session - 01/04/23 1434     Visit Number 6    Number of Visits 19    Date for PT Re-Evaluation 03/04/23    Authorization Type MCR    PT Start Time [id number]    PT Stop Time 1510    PT Time Calculation (min) 41 min    Behavior During Therapy [facility] for tasks assessed/performed              Past Medical History:  Diagnosis Date   Carotidynia    Diverticulitis of colon 05/22/2015   Gestational diabetes    Hyperlipidemia    Hypertension    Hypothyroidism    Syncope 2011   reaction to benicar   Past Surgical History:  Procedure Laterality Date   cervical polyp removed     FIBULA FRACTURE SURGERY     OPEN REDUCTION AND PINNING OF SPIRAL [place] LEFT FIBULA   TONSILLECTOMY     TUBAL LIGATION     Patient Active Problem List   Diagnosis Date Noted   Impingement of left shoulder 10/29/2022   SI (sacroiliac) joint dysfunction 08/31/2020   Nonallopathic lesion of sacral region 08/31/2020   Nonallopathic lesion of lumbar region 08/31/2020   Nonallopathic lesion of thoracic region 08/31/2020   Hyperlipidemia associated with type 2 diabetes mellitus ([place]) 03/02/2019   Hyperlipidemia 03/04/2016   IBS (irritable bowel syndrome) 01/16/2015   Essential hypertension 01/16/2015   Unspecified hypothyroidism 03/05/2014   Statin intolerance 03/05/2014   History of recurrent UTIs 06/10/2013   Pre-diabetes 02/10/2013   Hypothyroidism    ADVERSE REACTION TO MEDICATION 05/09/2010   ALLERGIC RHINITIS, SEASONAL, MILD 09/08/2009   CARCINOMA, SKIN, SQUAMOUS CELL 05/03/2009   URINARY INCONTINENCE, URGE, MILD 05/03/2009   HYPERLIPIDEMIA 05/07/2007   HYPERTENSION 05/07/2007   HYPERGLYCEMIA, FASTING 05/07/2007    PCP:  Burnis Medin, MD       REFERRING PROVIDER:   Lyndal Pulley, DO     REFERRING DIAG:  Diagnosis  M99.9 (ICD-10-CM) - Nonallopathic lesion of thoracic region  M25.512 (ICD-10-CM) - Acute pain of left shoulder    THERAPY DIAG:  Pain in thoracic spine  Left shoulder pain, unspecified chronicity  Muscle weakness (generalized)  Difficulty walking  Rationale for Evaluation and Treatment: Rehabilitation  ONSET DATE: 03/2021  SUBJECTIVE:  SUBJECTIVE STATEMENT:   "I think I may have overdone it on Wednesday".    PERTINENT HISTORY: Long COVID; back pain; brain fog of COVID; DM2  PAIN:  Are you having pain? Yes: NPRS scale: 4/10 Pain location:thighs, buttocks and lower back   Pain description: aching/soreness  Aggravating factors: exercising too long/ walking, over exertion, IR reaching Relieving factors: Tylenol  PRECAUTIONS: None  WEIGHT BEARING RESTRICTIONS: No  FALLS:  Has patient fallen in last 6 months? No  LIVING ENVIRONMENT: Lives with: lives with their family Lives in: House/apartment Stairs: No  Has following equipment at home: None  OCCUPATION: Retired Corporate investment banker 2x/week week Knits, does puzzles  PLOF: Independent  PATIENT GOALS: return to exercise, reduce pain    OBJECTIVE:   DIAGNOSTIC FINDINGS:  FINDINGS: The sacroiliac joint spaces are maintained and there is no evidence of arthropathy. No other bone abnormalities are seen.   IMPRESSION: Negative exam.  IMPRESSION: Transitional lumbosacral anatomy as described.   Mild degenerative disc disease L1-2 and L2-3. Lower lumbar facet arthropathy noted.   Aortic Atherosclerosis (ICD10-I70.0).  PATIENT SURVEYS :  FOTO 68 66 @ DC   COGNITION: Overall cognitive status: Within functional limits for tasks assessed     SENSATION: WFL  POSTURE: Mild kyphosis and  rounded shoulders; no gross pelvic obliquity noted   UPPER EXTREMITY ROM: p! Into L deltoid   Active ROM Right eval Left eval  Shoulder flexion 160 160 p!  Shoulder extension    Shoulder abduction 165 165 p!  Shoulder adduction    Shoulder internal rotation 70 70 p!  Shoulder external rotation 80 80 p!  (Blank rows = not tested)    Thoracic ROM: WFL for all directions except for limitation and stiffness with extension  UPPER EXTREMITY MMT:  MMT Right eval Left eval  Shoulder flexion 4+/5 4/5 p!  Shoulder extension    Shoulder abduction 4+/5 4/5  Shoulder adduction 4+/5 4/5  Shoulder internal rotation 4+/5 4+/5  Shoulder external rotation 4+/5 4/5 p!  Middle trapezius 4+/5 4/5  (Blank rows = not tested)  SHOULDER SPECIAL TESTS: Impingement tests: Neer impingement test: positive , Hawkins/Kennedy impingement test: positive , and Painful arc test: positive  SLAP lesions: Biceps load test: negative Instability tests: Load and shift test: negative Rotator cuff assessment: Empty can test: positive , Full can test: positive , and External rotation lag sign: negative Biceps assessment: Speed's test: negative  JOINT MOBILITY TESTING:  Stiffness of L compared to R; no excessive laxity or signs of instability  PALPATION:  Globally tender through thoracic and lumbar paraspinals bilat; TTP of bilat UT, levator, and ant delt/biceps   TODAY'S TREATMENT: Pt seen for aquatic therapy today.  Treatment took place in water 3.25-4.5 ft in depth at the [place]. Temp of water was 91.  Pt entered/exited the pool via stairs using alternating pattern with hand rail.  *without UE support forward/backward and side stepping x 8 widths total  - standing rest period * side step squat with shoulder addct with rainbow hand floats x 1 lap * squats pushing single rainbow hand float under water x 12;  braiding L/R x 3 laps * holding wall,  with fins donned on ankles:  hip abdct/  addct 3 x 5 - seated rest period *( fins donned) alternating hamstring curls x 10; hip ext x 5 each leg; forward walking kicks 1 lap * TrA set with short noodle pull down to thighs x 10 wide stance, then staggered  L/R 5 each *sitting balance on solid noodle x 4 minutes for core engagement, and bringing single hand out of water * gentle breast stroke  (without head entering the water) across width of pool -standing rest period * gentle breast stroke, 2 widths   Pt requires the buoyancy and hydrostatic pressure of water for support, and to offload joints by unweighting joint load by at least 50 % in navel deep water and by at least 75-80% in chest to neck deep water.  Viscosity of the water is needed for resistance of strengthening. Water current perturbations provides challenge to standing balance requiring increased core activation.  PATIENT EDUCATION: Education details: aquatics progressions and modifications Person educated: Patient Education method: Education officer, environmental, Corporate treasurer cues, Verbal cues, and Handouts Education comprehension: verbalized understanding, returned demonstration, verbal cues required, and tactile cues required   HOME EXERCISE PROGRAM:    Access Code: [email or web address]: [email or web address] Date: 12/04/2022 Prepared by: Daleen Bo  - Shoulder External Rotation and Scapular Retraction with Resistance  - 1 x daily - 7 x weekly - 2 sets - 10 reps - Standing Shoulder Horizontal Abduction with Resistance  - 1 x daily - 7 x weekly - 2 sets - 10 reps - Seated Thoracic Lumbar Extension  - 1 x daily - 7 x weekly - 1 sets - 10 reps - 5 hold   ASSESSMENT:   CLINICAL IMPRESSION: Modified intensity of exercises and session to pt's tolerance. She reported some increase in soreness in glutes after LE exercises with fins donned on ankles, but otherwise tolerated session well.  She is progressing well towards stated goals.  Will trial fins on wrist for shoulder  strengthening next visit.     OBJECTIVE IMPAIRMENTS decreased balance, decreased coordination, decreased endurance, decreased knowledge of use of DME, decreased mobility, difficulty walking, decreased strength, decreased safety awareness, impaired sensation, impaired UE functional use, improper body mechanics, postural dysfunction, obesity, and pain.    ACTIVITY LIMITATIONS carrying, lifting, squatting, stairs, transfers, reach over head, and locomotion level   PARTICIPATION LIMITATIONS: cleaning, laundry, community activity, and yard work   PERSONAL FACTORS Education, Fitness, Past/current experiences, Time since onset of injury/illness/exacerbation, and 1-2 comorbidities are also affecting patient's functional outcome.    REHAB POTENTIAL: good   CLINICAL DECISION MAKING: unstable/complicated   EVALUATION COMPLEXITY: moderate     GOALS:     SHORT TERM GOALS: Target date: 01/15/2023       Pt will become independent with HEP in order to demonstrate synthesis of PT education.   Goal status: INITIAL  2. Pt will report at least 2 pt reduction on NPRS scale for pain in order to demonstrate functional improvement with household activity, self care, and ADL.   Goal status: INITIAL           LONG TERM GOALS: Target date: 02/26/2023        Pt  will become independent with final HEP in order to demonstrate synthesis of PT education.   Goal status: INITIAL   2.   Pt will be able to reach [facility] and carry/hold >5 lbs in order to demonstrate functional improvement in L UE strength for return to PLOF and exercise.     Goal status: INITIAL   3. Pt will score >/= 66 on FOTO to demonstrate improvement in perceived L shoulder function.    Goal status: INITIAL   4. Pt will be able to demonstrate/report ability to walk >20 mins without pain in order to demonstrate functional improvement and tolerance  to exercise and community mobility.  Goal status: INITIAL         PLAN: PT  FREQUENCY: 1x/week   PT DURATION: 12 weeks (likely DC in 8-10)   PLANNED INTERVENTIONS: Therapeutic exercises, Therapeutic activity, Neuromuscular re-education, Balance training, Gait training, Patient/Family education, Joint mobilization, Stair training, DME instructions, Aquatic Therapy, Dry Needling, Electrical stimulation, Cryotherapy, Moist heat, Taping, Ultrasound, Ionotophoresis '4mg'$ /ml Dexamethasone, Manual therapy, and Re-evaluation   PLAN FOR NEXT SESSION:  start aquatics: general conditioning and light LE strength/endurance (will need slow progression due to increase in globalized pain with activity); thoracic mobility, L rotator cuff strength  Kerin Perna, PTA 01/04/23 3:14 PM [facility] [address] [place], [place], [address] Phone: [phone]   Fax:  [phone]

## 2023-01-07 NOTE — Therapy (Signed)
OUTPATIENT PHYSICAL THERAPY UPPER EXTREMITY TREATMENT   Patient Name: Valerie Brady MRN: [id number] DOB:Mar 15, 1946, 77 y.o., female Today's Date: 01/08/2023  END OF SESSION:  PT End of Session - 01/08/23 1452     Visit Number 7    Number of Visits 19    Date for PT Re-Evaluation 03/04/23    Authorization Type MCR    PT Start Time 1449    PT Stop Time 1530    PT Time Calculation (min) 41 min    Activity Tolerance Patient tolerated treatment well;Patient limited by fatigue    Behavior During Therapy [facility] for tasks assessed/performed              Past Medical History:  Diagnosis Date   Carotidynia    Diverticulitis of colon 05/22/2015   Gestational diabetes    Hyperlipidemia    Hypertension    Hypothyroidism    Syncope 2011   reaction to benicar   Past Surgical History:  Procedure Laterality Date   cervical polyp removed     FIBULA FRACTURE SURGERY     OPEN REDUCTION AND PINNING OF SPIRAL [place] LEFT FIBULA   TONSILLECTOMY     TUBAL LIGATION     Patient Active Problem List   Diagnosis Date Noted   Impingement of left shoulder 10/29/2022   SI (sacroiliac) joint dysfunction 08/31/2020   Nonallopathic lesion of sacral region 08/31/2020   Nonallopathic lesion of lumbar region 08/31/2020   Nonallopathic lesion of thoracic region 08/31/2020   Hyperlipidemia associated with type 2 diabetes mellitus ([facility]) 03/02/2019   Hyperlipidemia 03/04/2016   IBS (irritable bowel syndrome) 01/16/2015   Essential hypertension 01/16/2015   Unspecified hypothyroidism 03/05/2014   Statin intolerance 03/05/2014   History of recurrent UTIs 06/10/2013   Pre-diabetes 02/10/2013   Hypothyroidism    ADVERSE REACTION TO MEDICATION 05/09/2010   ALLERGIC RHINITIS, SEASONAL, MILD 09/08/2009   CARCINOMA, SKIN, SQUAMOUS CELL 05/03/2009   URINARY INCONTINENCE, URGE, MILD 05/03/2009   HYPERLIPIDEMIA 05/07/2007   HYPERTENSION 05/07/2007   HYPERGLYCEMIA, FASTING 05/07/2007    PCP:  Madelin Headings, MD       REFERRING PROVIDER:   Judi Saa, DO    REFERRING DIAG:  Diagnosis  M99.9 (ICD-10-CM) - Nonallopathic lesion of thoracic region  M25.512 (ICD-10-CM) - Acute pain of left shoulder    THERAPY DIAG:  Pain in thoracic spine  Left shoulder pain, unspecified chronicity  Muscle weakness (generalized)  Difficulty walking  Rationale for Evaluation and Treatment: Rehabilitation  ONSET DATE: 03/2021  SUBJECTIVE:  SUBJECTIVE STATEMENT:   "Did go at it with a little less vigor last session and I wasn't as tired.  Had a really bad morning".    PERTINENT HISTORY: Long COVID; back pain; brain fog of COVID; DM2  PAIN:  Are you having pain? Yes: NPRS scale: 4/10 Pain location:thighs, buttocks and lower back   Pain description: aching/soreness  Aggravating factors: exercising too long/ walking, over exertion, IR reaching Relieving factors: Tylenol  PRECAUTIONS: None  WEIGHT BEARING RESTRICTIONS: No  FALLS:  Has patient fallen in last 6 months? No  LIVING ENVIRONMENT: Lives with: lives with their family Lives in: House/apartment Stairs: No  Has following equipment at home: None  OCCUPATION: Retired Insurance claims handler 2x/week week Knits, does puzzles  PLOF: Independent  PATIENT GOALS: return to exercise, reduce pain    OBJECTIVE:   DIAGNOSTIC FINDINGS:  FINDINGS: The sacroiliac joint spaces are maintained and there is no evidence of arthropathy. No other bone abnormalities are seen.   IMPRESSION: Negative exam.  IMPRESSION: Transitional lumbosacral anatomy as described.   Mild degenerative disc disease L1-2 and L2-3. Lower lumbar facet arthropathy noted.   Aortic Atherosclerosis (ICD10-I70.0).  PATIENT SURVEYS :  FOTO 68 66 @  DC   COGNITION: Overall cognitive status: Within functional limits for tasks assessed     SENSATION: WFL  POSTURE: Mild kyphosis and rounded shoulders; no gross pelvic obliquity noted   UPPER EXTREMITY ROM: p! Into L deltoid   Active ROM Right eval Left eval  Shoulder flexion 160 160 p!  Shoulder extension    Shoulder abduction 165 165 p!  Shoulder adduction    Shoulder internal rotation 70 70 p!  Shoulder external rotation 80 80 p!  (Blank rows = not tested)    Thoracic ROM: WFL for all directions except for limitation and stiffness with extension  UPPER EXTREMITY MMT:  MMT Right eval Left eval  Shoulder flexion 4+/5 4/5 p!  Shoulder extension    Shoulder abduction 4+/5 4/5  Shoulder adduction 4+/5 4/5  Shoulder internal rotation 4+/5 4+/5  Shoulder external rotation 4+/5 4/5 p!  Middle trapezius 4+/5 4/5  (Blank rows = not tested)  SHOULDER SPECIAL TESTS: Impingement tests: Neer impingement test: positive , Hawkins/Kennedy impingement test: positive , and Painful arc test: positive  SLAP lesions: Biceps load test: negative Instability tests: Load and shift test: negative Rotator cuff assessment: Empty can test: positive , Full can test: positive , and External rotation lag sign: negative Biceps assessment: Speed's test: negative  JOINT MOBILITY TESTING:  Stiffness of L compared to R; no excessive laxity or signs of instability  PALPATION:  Globally tender through thoracic and lumbar paraspinals bilat; TTP of bilat UT, levator, and ant delt/biceps   TODAY'S TREATMENT: Pt seen for aquatic therapy today.  Treatment took place in water 3.25-4.5 ft in depth at the [place] [facility] pool. Temp of water was 91.  Pt entered/exited the pool via stairs using alternating pattern with hand rail.  *without UE support forward/backward and side stepping x 10 widths total * holding wall: df; pf; marching; hip abdct/ addct; hip extension x10-12 - seated rest  period *STS from bench onto blue step x10 *Seated: add/abd x 10 *sitting balance on solid noodle x 4 minutes for core engagement, and bringing single hand out of water;  * TrA set with short noodle pull down to thighs wide stance, then staggered L/R x 5 ea  * gentle breast stroke  (without head entering the water) across width of  pool -standing rest period * gentle breast stroke, 2 widths   Pt requires the buoyancy and hydrostatic pressure of water for support, and to offload joints by unweighting joint load by at least 50 % in navel deep water and by at least 75-80% in chest to neck deep water.  Viscosity of the water is needed for resistance of strengthening. Water current perturbations provides challenge to standing balance requiring increased core activation.  PATIENT EDUCATION: Education details: aquatics progressions and modifications Person educated: Patient Education method: Solicitor, Actor cues, Verbal cues, and Handouts Education comprehension: verbalized understanding, returned demonstration, verbal cues required, and tactile cues required   HOME EXERCISE PROGRAM:    Access Code: [email or web address] URL: [email or web address] Date: 12/04/2022 Prepared by: Zebedee Iba  - Shoulder External Rotation and Scapular Retraction with Resistance  - 1 x daily - 7 x weekly - 2 sets - 10 reps - Standing Shoulder Horizontal Abduction with Resistance  - 1 x daily - 7 x weekly - 2 sets - 10 reps - Seated Thoracic Lumbar Extension  - 1 x daily - 7 x weekly - 1 sets - 10 reps - 5 hold   ASSESSMENT:   CLINICAL IMPRESSION: Modified intensity of exercises slightly again as pt reports bad morning.  Was able to advance sitting balance challenge on noodle working core, tolerated well. She is able to gage well her toleration at all sessions and assists in directing pace and intensity of session. Pain reduction to 0/10. Goals ongoing.    OBJECTIVE IMPAIRMENTS decreased  balance, decreased coordination, decreased endurance, decreased knowledge of use of DME, decreased mobility, difficulty walking, decreased strength, decreased safety awareness, impaired sensation, impaired UE functional use, improper body mechanics, postural dysfunction, obesity, and pain.    ACTIVITY LIMITATIONS carrying, lifting, squatting, stairs, transfers, reach over head, and locomotion level   PARTICIPATION LIMITATIONS: cleaning, laundry, community activity, and yard work   PERSONAL FACTORS Education, Fitness, Past/current experiences, Time since onset of injury/illness/exacerbation, and 1-2 comorbidities are also affecting patient's functional outcome.    REHAB POTENTIAL: good   CLINICAL DECISION MAKING: unstable/complicated   EVALUATION COMPLEXITY: moderate     GOALS:     SHORT TERM GOALS: Target date: 01/15/2023       Pt will become independent with HEP in order to demonstrate synthesis of PT education.   Goal status: INITIAL  2. Pt will report at least 2 pt reduction on NPRS scale for pain in order to demonstrate functional improvement with household activity, self care, and ADL.   Goal status: INITIAL           LONG TERM GOALS: Target date: 02/26/2023        Pt  will become independent with final HEP in order to demonstrate synthesis of PT education.   Goal status: INITIAL   2.   Pt will be able to reach [facility] and carry/hold >5 lbs in order to demonstrate functional improvement in L UE strength for return to PLOF and exercise.     Goal status: INITIAL   3. Pt will score >/= 66 on FOTO to demonstrate improvement in perceived L shoulder function.    Goal status: INITIAL   4. Pt will be able to demonstrate/report ability to walk >20 mins without pain in order to demonstrate functional improvement and tolerance to exercise and community mobility.  Goal status: INITIAL         PLAN: PT FREQUENCY: 1x/week   PT DURATION: 12 weeks (likely DC in 8-10)  PLANNED INTERVENTIONS: Therapeutic exercises, Therapeutic activity, Neuromuscular re-education, Balance training, Gait training, Patient/Family education, Joint mobilization, Stair training, DME instructions, Aquatic Therapy, Dry Needling, Electrical stimulation, Cryotherapy, Moist heat, Taping, Ultrasound, Ionotophoresis 4mg /ml Dexamethasone, Manual therapy, and Re-evaluation   PLAN FOR NEXT SESSION:  start aquatics: general conditioning and light LE strength/endurance (will need slow progression due to increase in globalized pain with activity); thoracic mobility, L rotator cuff strength  Corrie Dandy Tomma Lightning) Nadiah Corbit MPT 01/08/23 3:02 PM [facility] [address] [place], [place], [address] Phone: [phone]   Fax:  [phone]

## 2023-01-08 ENCOUNTER — Encounter (HOSPITAL_BASED_OUTPATIENT_CLINIC_OR_DEPARTMENT_OTHER): Payer: Self-pay | Admitting: Physical Therapy

## 2023-01-08 ENCOUNTER — Ambulatory Visit (HOSPITAL_BASED_OUTPATIENT_CLINIC_OR_DEPARTMENT_OTHER): Payer: Medicare Other | Admitting: Physical Therapy

## 2023-01-08 DIAGNOSIS — R262 Difficulty in walking, not elsewhere classified: Secondary | ICD-10-CM | POA: Diagnosis not present

## 2023-01-08 DIAGNOSIS — M25512 Pain in left shoulder: Secondary | ICD-10-CM

## 2023-01-08 DIAGNOSIS — M6281 Muscle weakness (generalized): Secondary | ICD-10-CM

## 2023-01-08 DIAGNOSIS — M546 Pain in thoracic spine: Secondary | ICD-10-CM | POA: Diagnosis not present

## 2023-01-11 ENCOUNTER — Ambulatory Visit (HOSPITAL_BASED_OUTPATIENT_CLINIC_OR_DEPARTMENT_OTHER): Payer: Medicare Other | Attending: Family Medicine | Admitting: Physical Therapy

## 2023-01-11 ENCOUNTER — Encounter (HOSPITAL_BASED_OUTPATIENT_CLINIC_OR_DEPARTMENT_OTHER): Payer: Self-pay | Admitting: Physical Therapy

## 2023-01-11 DIAGNOSIS — M25512 Pain in left shoulder: Secondary | ICD-10-CM | POA: Insufficient documentation

## 2023-01-11 DIAGNOSIS — R262 Difficulty in walking, not elsewhere classified: Secondary | ICD-10-CM | POA: Insufficient documentation

## 2023-01-11 DIAGNOSIS — M6281 Muscle weakness (generalized): Secondary | ICD-10-CM | POA: Insufficient documentation

## 2023-01-11 DIAGNOSIS — M546 Pain in thoracic spine: Secondary | ICD-10-CM | POA: Diagnosis not present

## 2023-01-11 NOTE — Therapy (Signed)
OUTPATIENT PHYSICAL THERAPY UPPER EXTREMITY TREATMENT   Patient Name: Valerie Brady MRN: [id number] DOB:[birth year], 77 y.o., female Today's Date: 01/11/2023  END OF SESSION:  PT End of Session - 01/11/23 1440     Visit Number 8    Number of Visits 19    Date for PT Re-Evaluation 03/04/23    Authorization Type MCR    PT Start Time [id number]    PT Stop Time 1510    PT Time Calculation (min) 41 min    Activity Tolerance Patient tolerated treatment well;Patient limited by fatigue              Past Medical History:  Diagnosis Date   Carotidynia    Diverticulitis of colon 05/22/2015   Gestational diabetes    Hyperlipidemia    Hypertension    Hypothyroidism    Syncope 2011   reaction to benicar   Past Surgical History:  Procedure Laterality Date   cervical polyp removed     FIBULA FRACTURE SURGERY     OPEN REDUCTION AND PINNING OF SPIRAL [place] LEFT FIBULA   TONSILLECTOMY     TUBAL LIGATION     Patient Active Problem List   Diagnosis Date Noted   Impingement of left shoulder 10/29/2022   SI (sacroiliac) joint dysfunction 08/31/2020   Nonallopathic lesion of sacral region 08/31/2020   Nonallopathic lesion of lumbar region 08/31/2020   Nonallopathic lesion of thoracic region 08/31/2020   Hyperlipidemia associated with type 2 diabetes mellitus ([place]) 03/02/2019   Hyperlipidemia 03/04/2016   IBS (irritable bowel syndrome) 01/16/2015   Essential hypertension 01/16/2015   Unspecified hypothyroidism 03/05/2014   Statin intolerance 03/05/2014   History of recurrent UTIs 06/10/2013   Pre-diabetes 02/10/2013   Hypothyroidism    ADVERSE REACTION TO MEDICATION 05/09/2010   ALLERGIC RHINITIS, SEASONAL, MILD 09/08/2009   CARCINOMA, SKIN, SQUAMOUS CELL 05/03/2009   URINARY INCONTINENCE, URGE, MILD 05/03/2009   HYPERLIPIDEMIA 05/07/2007   HYPERTENSION 05/07/2007   HYPERGLYCEMIA, FASTING 05/07/2007    PCP:  Burnis Medin, MD       REFERRING PROVIDER:   Lyndal Pulley, DO    REFERRING DIAG:  Diagnosis  M99.9 (ICD-10-CM) - Nonallopathic lesion of thoracic region  M25.512 (ICD-10-CM) - Acute pain of left shoulder    THERAPY DIAG:  Pain in thoracic spine  Left shoulder pain, unspecified chronicity  Muscle weakness (generalized)  Rationale for Evaluation and Treatment: Rehabilitation  ONSET DATE: 03/2021  SUBJECTIVE:  SUBJECTIVE STATEMENT:   " I think my stamina has improved".   Pt reports that her brain fog has been bad today.  Her LE felt tired with the walk from parking garage to pool area.   PERTINENT HISTORY: Long COVID; back pain; brain fog of COVID; DM2  PAIN:  Are you having pain? no: NPRS scale:0/10 Pain location:   Pain description: Aggravating factors: exercising too long/ walking, over exertion, IR reaching Relieving factors: Tylenol  PRECAUTIONS: None  WEIGHT BEARING RESTRICTIONS: No  FALLS:  Has patient fallen in last 6 months? No  LIVING ENVIRONMENT: Lives with: lives with their family Lives in: House/apartment Stairs: No  Has following equipment at home: None  OCCUPATION: Retired Corporate investment banker 2x/week week Knits, does puzzles  PLOF: Independent  PATIENT GOALS: return to exercise, reduce pain    OBJECTIVE:   DIAGNOSTIC FINDINGS:  FINDINGS: The sacroiliac joint spaces are maintained and there is no evidence of arthropathy. No other bone abnormalities are seen.   IMPRESSION: Negative exam.  IMPRESSION: Transitional lumbosacral anatomy as described.   Mild degenerative disc disease L1-2 and L2-3. Lower lumbar facet arthropathy noted.   Aortic Atherosclerosis (ICD10-I70.0).  PATIENT SURVEYS :  FOTO 68 66 @ DC   COGNITION: Overall cognitive status: Within functional limits for tasks  assessed     SENSATION: WFL  POSTURE: Mild kyphosis and rounded shoulders; no gross pelvic obliquity noted   UPPER EXTREMITY ROM: p! Into L deltoid   Active ROM Right eval Left eval  Shoulder flexion 160 160 p!  Shoulder extension    Shoulder abduction 165 165 p!  Shoulder adduction    Shoulder internal rotation 70 70 p!  Shoulder external rotation 80 80 p!  (Blank rows = not tested)    Thoracic ROM: WFL for all directions except for limitation and stiffness with extension  UPPER EXTREMITY MMT:  MMT Right eval Left eval  Shoulder flexion 4+/5 4/5 p!  Shoulder extension    Shoulder abduction 4+/5 4/5  Shoulder adduction 4+/5 4/5  Shoulder internal rotation 4+/5 4+/5  Shoulder external rotation 4+/5 4/5 p!  Middle trapezius 4+/5 4/5  (Blank rows = not tested)  SHOULDER SPECIAL TESTS: Impingement tests: Neer impingement test: positive , Hawkins/Kennedy impingement test: positive , and Painful arc test: positive  SLAP lesions: Biceps load test: negative Instability tests: Load and shift test: negative Rotator cuff assessment: Empty can test: positive , Full can test: positive , and External rotation lag sign: negative Biceps assessment: Speed's test: negative  JOINT MOBILITY TESTING:  Stiffness of L compared to R; no excessive laxity or signs of instability  PALPATION:  Globally tender through thoracic and lumbar paraspinals bilat; TTP of bilat UT, levator, and ant delt/biceps   TODAY'S TREATMENT: Pt seen for aquatic therapy today.  Treatment took place in water 3.25-4.5 ft in depth at the [facility]. Temp of water was 91.  Pt entered/exited the pool via stairs using alternating pattern with hand rail.  *without UE support forward/backward * side stepping with shoulder addct with rainbow hand floats x 2 laps * seated on yellow noodle:  seated balance with hands on lap and bringing hands just out of water, then pelvic tilts * holding wall:  hip  abdct/ addct (short and quick) x 20 each; hip ext x 10; heel/toe raises x 10 * TrA set with short noodle pull down to thighs wide stance x 12, then staggered L/R x 5 ea * with fins on wrists:  NBOS with reciprocal arm swing; SLS with opp arm circles;  standard stance with bilat shoulder ER/IR, WBOS with bilat shoulder horiz abdct/ addct * walking forward / backward with reciprocal arm swing with fins - 1 lap  * gentle breast stroke (UE/LE) Pt requires the buoyancy and hydrostatic pressure of water for support, and to offload joints by unweighting joint load by at least 50 % in navel deep water and by at least 75-80% in chest to neck deep water.  Viscosity of the water is needed for resistance of strengthening. Water current perturbations provides challenge to standing balance requiring increased core activation.  PATIENT EDUCATION: Education details: aquatics progressions and modifications Person educated: Patient Education method: Education officer, environmental, Corporate treasurer cues, Verbal cues, and Handouts Education comprehension: verbalized understanding, returned demonstration, verbal cues required, and tactile cues required   HOME EXERCISE PROGRAM:    Access Code: [place]: [email or web address] Date: 12/04/2022 Prepared by: Daleen Bo  - Shoulder External Rotation and Scapular Retraction with Resistance  - 1 x daily - 7 x weekly - 2 sets - 10 reps - Standing Shoulder Horizontal Abduction with Resistance  - 1 x daily - 7 x weekly - 2 sets - 10 reps - Seated Thoracic Lumbar Extension  - 1 x daily - 7 x weekly - 1 sets - 10 reps - 5 hold   ASSESSMENT:   CLINICAL IMPRESSION: Pt did not need rest break today, as we alternated body parts and modified intensity of exercises. She is able to gage well her toleration during sessions and assists in directing pace and intensity of session. Brain fog improved durng session.  Goals ongoing.    OBJECTIVE IMPAIRMENTS decreased balance,  decreased coordination, decreased endurance, decreased knowledge of use of DME, decreased mobility, difficulty walking, decreased strength, decreased safety awareness, impaired sensation, impaired UE functional use, improper body mechanics, postural dysfunction, obesity, and pain.    ACTIVITY LIMITATIONS carrying, lifting, squatting, stairs, transfers, reach over head, and locomotion level   PARTICIPATION LIMITATIONS: cleaning, laundry, community activity, and yard work   PERSONAL FACTORS Education, Fitness, Past/current experiences, Time since onset of injury/illness/exacerbation, and 1-2 comorbidities are also affecting patient's functional outcome.    REHAB POTENTIAL: good   CLINICAL DECISION MAKING: unstable/complicated   EVALUATION COMPLEXITY: moderate     GOALS:     SHORT TERM GOALS: Target date: 01/15/2023       Pt will become independent with HEP in order to demonstrate synthesis of PT education.   Goal status: INITIAL  2. Pt will report at least 2 pt reduction on NPRS scale for pain in order to demonstrate functional improvement with household activity, self care, and ADL.   Goal status: INITIAL           LONG TERM GOALS: Target date: 02/26/2023        Pt  will become independent with final HEP in order to demonstrate synthesis of PT education.   Goal status: INITIAL   2.   Pt will be able to reach [facility] and carry/hold >5 lbs in order to demonstrate functional improvement in L UE strength for return to PLOF and exercise.     Goal status: INITIAL   3. Pt will score >/= 66 on FOTO to demonstrate improvement in perceived L shoulder function.    Goal status: INITIAL   4. Pt will be able to demonstrate/report ability to walk >20 mins without pain in order to demonstrate functional improvement and tolerance to exercise and community mobility.  Goal status:  INITIAL         PLAN: PT FREQUENCY: 1x/week   PT DURATION: 12 weeks (likely DC in 8-10)   PLANNED  INTERVENTIONS: Therapeutic exercises, Therapeutic activity, Neuromuscular re-education, Balance training, Gait training, Patient/Family education, Joint mobilization, Stair training, DME instructions, Aquatic Therapy, Dry Needling, Electrical stimulation, Cryotherapy, Moist heat, Taping, Ultrasound, Ionotophoresis '4mg'$ /ml Dexamethasone, Manual therapy, and Re-evaluation   PLAN FOR NEXT SESSION:  start aquatics: general conditioning and light LE strength/endurance (will need slow progression due to increase in globalized pain with activity); thoracic mobility, L rotator cuff strength  Kerin Perna, PTA 01/11/23 4:01 PM [facility] [address] [place], [place], [address] Phone: [phone]   Fax:  [phone]

## 2023-01-15 ENCOUNTER — Ambulatory Visit (HOSPITAL_BASED_OUTPATIENT_CLINIC_OR_DEPARTMENT_OTHER): Payer: Medicare Other | Admitting: Physical Therapy

## 2023-01-15 ENCOUNTER — Encounter (HOSPITAL_BASED_OUTPATIENT_CLINIC_OR_DEPARTMENT_OTHER): Payer: Self-pay | Admitting: Physical Therapy

## 2023-01-15 DIAGNOSIS — M25512 Pain in left shoulder: Secondary | ICD-10-CM

## 2023-01-15 DIAGNOSIS — M6281 Muscle weakness (generalized): Secondary | ICD-10-CM

## 2023-01-15 DIAGNOSIS — R262 Difficulty in walking, not elsewhere classified: Secondary | ICD-10-CM

## 2023-01-15 DIAGNOSIS — M546 Pain in thoracic spine: Secondary | ICD-10-CM

## 2023-01-15 NOTE — Therapy (Signed)
OUTPATIENT PHYSICAL THERAPY UPPER EXTREMITY TREATMENT   Patient Name: Valerie Brady MRN: [id number] DOB:Aug 25, 1946, 77 y.o., female Today's Date: 01/15/2023  END OF SESSION:  PT End of Session - 01/15/23 1821     Visit Number 9    Number of Visits 19    Date for PT Re-Evaluation 03/04/23    Authorization Type MCR    PT Start Time 1446    PT Stop Time [id number]    PT Time Calculation (min) 44 min    Activity Tolerance Patient tolerated treatment well;Patient limited by fatigue              Past Medical History:  Diagnosis Date   Carotidynia    Diverticulitis of colon 05/22/2015   Gestational diabetes    Hyperlipidemia    Hypertension    Hypothyroidism    Syncope 2011   reaction to benicar   Past Surgical History:  Procedure Laterality Date   cervical polyp removed     FIBULA FRACTURE SURGERY     OPEN REDUCTION AND PINNING OF SPIRAL [place] LEFT FIBULA   TONSILLECTOMY     TUBAL LIGATION     Patient Active Problem List   Diagnosis Date Noted   Impingement of left shoulder 10/29/2022   SI (sacroiliac) joint dysfunction 08/31/2020   Nonallopathic lesion of sacral region 08/31/2020   Nonallopathic lesion of lumbar region 08/31/2020   Nonallopathic lesion of thoracic region 08/31/2020   Hyperlipidemia associated with type 2 diabetes mellitus ([place]) 03/02/2019   Hyperlipidemia 03/04/2016   IBS (irritable bowel syndrome) 01/16/2015   Essential hypertension 01/16/2015   Unspecified hypothyroidism 03/05/2014   Statin intolerance 03/05/2014   History of recurrent UTIs 06/10/2013   Pre-diabetes 02/10/2013   Hypothyroidism    ADVERSE REACTION TO MEDICATION 05/09/2010   ALLERGIC RHINITIS, SEASONAL, MILD 09/08/2009   CARCINOMA, SKIN, SQUAMOUS CELL 05/03/2009   URINARY INCONTINENCE, URGE, MILD 05/03/2009   HYPERLIPIDEMIA 05/07/2007   HYPERTENSION 05/07/2007   HYPERGLYCEMIA, FASTING 05/07/2007    PCP:  Burnis Medin, MD       REFERRING PROVIDER:   Lyndal Pulley, DO    REFERRING DIAG:  Diagnosis  M99.9 (ICD-10-CM) - Nonallopathic lesion of thoracic region  M25.512 (ICD-10-CM) - Acute pain of left shoulder    THERAPY DIAG:  Pain in thoracic spine  Left shoulder pain, unspecified chronicity  Muscle weakness (generalized)  Difficulty walking  Rationale for Evaluation and Treatment: Rehabilitation  ONSET DATE: 03/2021  SUBJECTIVE:  SUBJECTIVE STATEMENT:   " I have been in a brain fog for past few days"  PERTINENT HISTORY: Long COVID; back pain; brain fog of COVID; DM2  PAIN:  Are you having pain? no: NPRS scale:0/10 Pain location:   Pain description: Aggravating factors: exercising too long/ walking, over exertion, IR reaching Relieving factors: Tylenol  PRECAUTIONS: None  WEIGHT BEARING RESTRICTIONS: No  FALLS:  Has patient fallen in last 6 months? No  LIVING ENVIRONMENT: Lives with: lives with their family Lives in: House/apartment Stairs: No  Has following equipment at home: None  OCCUPATION: Retired Corporate investment banker 2x/week week Knits, does puzzles  PLOF: Independent  PATIENT GOALS: return to exercise, reduce pain    OBJECTIVE:   DIAGNOSTIC FINDINGS:  FINDINGS: The sacroiliac joint spaces are maintained and there is no evidence of arthropathy. No other bone abnormalities are seen.   IMPRESSION: Negative exam.  IMPRESSION: Transitional lumbosacral anatomy as described.   Mild degenerative disc disease L1-2 and L2-3. Lower lumbar facet arthropathy noted.   Aortic Atherosclerosis (ICD10-I70.0).  PATIENT SURVEYS :  FOTO 68 66 @ DC   COGNITION: Overall cognitive status: Within functional limits for tasks assessed     SENSATION: WFL  POSTURE: Mild kyphosis and rounded shoulders; no gross pelvic obliquity  noted   UPPER EXTREMITY ROM: p! Into L deltoid   Active ROM Right eval Left eval  Shoulder flexion 160 160 p!  Shoulder extension    Shoulder abduction 165 165 p!  Shoulder adduction    Shoulder internal rotation 70 70 p!  Shoulder external rotation 80 80 p!  (Blank rows = not tested)    Thoracic ROM: WFL for all directions except for limitation and stiffness with extension  UPPER EXTREMITY MMT:  MMT Right eval Left eval  Shoulder flexion 4+/5 4/5 p!  Shoulder extension    Shoulder abduction 4+/5 4/5  Shoulder adduction 4+/5 4/5  Shoulder internal rotation 4+/5 4+/5  Shoulder external rotation 4+/5 4/5 p!  Middle trapezius 4+/5 4/5  (Blank rows = not tested)  SHOULDER SPECIAL TESTS: Impingement tests: Neer impingement test: positive , Hawkins/Kennedy impingement test: positive , and Painful arc test: positive  SLAP lesions: Biceps load test: negative Instability tests: Load and shift test: negative Rotator cuff assessment: Empty can test: positive , Full can test: positive , and External rotation lag sign: negative Biceps assessment: Speed's test: negative  JOINT MOBILITY TESTING:  Stiffness of L compared to R; no excessive laxity or signs of instability  PALPATION:  Globally tender through thoracic and lumbar paraspinals bilat; TTP of bilat UT, levator, and ant delt/biceps   TODAY'S TREATMENT: Pt seen for aquatic therapy today.  Treatment took place in water 3.25-4.5 ft in depth at the [facility]. Temp of water was 91.  Pt entered/exited the pool via stairs using alternating pattern with hand rail.  *without UE support forward/backward * side stepping with shoulder addct with rainbow hand floats x 2 laps * TrA set with solid noodle pull down to thighs wide stance x 12, then staggered L/R x 5 ea * seated on yellow noodle:  seated balance with hands on lap and bringing hands just out of water * with fins on wrists:  NBOS with reciprocal arm swing;  SLS with opp arm circles;  standard stance with bilat shoulder ER/IR, WBOS with bilat shoulder horiz abdct/ addct *SLS R/L after several tries >15s.   Pt requires the buoyancy and hydrostatic pressure of water for support, and to offload  joints by unweighting joint load by at least 50 % in navel deep water and by at least 75-80% in chest to neck deep water.  Viscosity of the water is needed for resistance of strengthening. Water current perturbations provides challenge to standing balance requiring increased core activation.  PATIENT EDUCATION: Education details: aquatics progressions and modifications Person educated: Patient Education method: Education officer, environmental, Corporate treasurer cues, Verbal cues, and Handouts Education comprehension: verbalized understanding, returned demonstration, verbal cues required, and tactile cues required   HOME EXERCISE PROGRAM:    Access Code: [place]: [email or web address] Date: 12/04/2022 Prepared by: Daleen Bo  - Shoulder External Rotation and Scapular Retraction with Resistance  - 1 x daily - 7 x weekly - 2 sets - 10 reps - Standing Shoulder Horizontal Abduction with Resistance  - 1 x daily - 7 x weekly - 2 sets - 10 reps - Seated Thoracic Lumbar Extension  - 1 x daily - 7 x weekly - 1 sets - 10 reps - 5 hold   ASSESSMENT:   CLINICAL IMPRESSION: Pt continues with improving endurance, without need for rest period.  Tolerates progressive intervention well, without complaints of pain or fatigue.  SLS improvement with holding >15s R/L. Reports daily improving toleration to activity since beginning of aquatic intervention. Next visit 10th PN and re-assess     OBJECTIVE IMPAIRMENTS decreased balance, decreased coordination, decreased endurance, decreased knowledge of use of DME, decreased mobility, difficulty walking, decreased strength, decreased safety awareness, impaired sensation, impaired UE functional use, improper body mechanics,  postural dysfunction, obesity, and pain.    ACTIVITY LIMITATIONS carrying, lifting, squatting, stairs, transfers, reach over head, and locomotion level   PARTICIPATION LIMITATIONS: cleaning, laundry, community activity, and yard work   PERSONAL FACTORS Education, Fitness, Past/current experiences, Time since onset of injury/illness/exacerbation, and 1-2 comorbidities are also affecting patient's functional outcome.    REHAB POTENTIAL: good   CLINICAL DECISION MAKING: unstable/complicated   EVALUATION COMPLEXITY: moderate     GOALS:     SHORT TERM GOALS: Target date: 01/15/2023       Pt will become independent with HEP in order to demonstrate synthesis of PT education.   Goal status: INITIAL  2. Pt will report at least 2 pt reduction on NPRS scale for pain in order to demonstrate functional improvement with household activity, self care, and ADL.   Goal status: INITIAL           LONG TERM GOALS: Target date: 02/26/2023        Pt  will become independent with final HEP in order to demonstrate synthesis of PT education.   Goal status: INITIAL   2.   Pt will be able to reach [facility] and carry/hold >5 lbs in order to demonstrate functional improvement in L UE strength for return to PLOF and exercise.     Goal status: INITIAL   3. Pt will score >/= 66 on FOTO to demonstrate improvement in perceived L shoulder function.    Goal status: INITIAL   4. Pt will be able to demonstrate/report ability to walk >20 mins without pain in order to demonstrate functional improvement and tolerance to exercise and community mobility.  Goal status: INITIAL         PLAN: PT FREQUENCY: 1x/week   PT DURATION: 12 weeks (likely DC in 8-10)   PLANNED INTERVENTIONS: Therapeutic exercises, Therapeutic activity, Neuromuscular re-education, Balance training, Gait training, Patient/Family education, Joint mobilization, Stair training, DME instructions, Aquatic Therapy, Dry Needling, Electrical  stimulation, Cryotherapy, Moist heat, Taping,  Ultrasound, Ionotophoresis '4mg'$ /ml Dexamethasone, Manual therapy, and Re-evaluation   PLAN FOR NEXT SESSION:  start aquatics: general conditioning and light LE strength/endurance (will need slow progression due to increase in globalized pain with activity); thoracic mobility, L rotator cuff strength  Stanton Kidney Tharon Aquas) Suad Autrey MPT 01/15/23 6:22 PM [facility] [address] [place], [place], [address] Phone: [phone]   Fax:  [phone]

## 2023-01-16 DIAGNOSIS — Z1231 Encounter for screening mammogram for malignant neoplasm of breast: Secondary | ICD-10-CM | POA: Diagnosis not present

## 2023-01-16 LAB — HM MAMMOGRAPHY

## 2023-01-18 ENCOUNTER — Ambulatory Visit (HOSPITAL_BASED_OUTPATIENT_CLINIC_OR_DEPARTMENT_OTHER): Payer: Medicare Other | Admitting: Physical Therapy

## 2023-01-18 ENCOUNTER — Encounter (HOSPITAL_BASED_OUTPATIENT_CLINIC_OR_DEPARTMENT_OTHER): Payer: Self-pay | Admitting: Physical Therapy

## 2023-01-18 DIAGNOSIS — M6281 Muscle weakness (generalized): Secondary | ICD-10-CM | POA: Diagnosis not present

## 2023-01-18 DIAGNOSIS — M546 Pain in thoracic spine: Secondary | ICD-10-CM

## 2023-01-18 DIAGNOSIS — M25512 Pain in left shoulder: Secondary | ICD-10-CM

## 2023-01-18 DIAGNOSIS — R262 Difficulty in walking, not elsewhere classified: Secondary | ICD-10-CM | POA: Diagnosis not present

## 2023-01-18 NOTE — Therapy (Addendum)
OUTPATIENT PHYSICAL THERAPY UPPER EXTREMITY TREATMENT Progress Note Reporting Period 12/04/22 to 01/18/23  See note below for Objective Data and Assessment of Progress/Goals.      Patient Name: Valerie Brady MRN: [id number] DOB:1946/03/09, 77 y.o., female Today's Date: 01/21/2023  END OF SESSION:  PT End of Session -     Visit Number 10   Number of Visits 19   Date for PT Re-Evaluation 03/04/23   Authorization Type Mcr   PT Start Time  1436   PT Stop Time 1515   PT Time Calculation (min) 39  in   Activity Tolerance Tolerated well   Behavior During Therapy  39 min      Past Medical History:  Diagnosis Date   Carotidynia    Diverticulitis of colon 05/22/2015   Gestational diabetes    Hyperlipidemia    Hypertension    Hypothyroidism    Syncope 2011   reaction to benicar   Past Surgical History:  Procedure Laterality Date   cervical polyp removed     FIBULA FRACTURE SURGERY     OPEN REDUCTION AND PINNING OF SPIRAL FX LEFT FIBULA   TONSILLECTOMY     TUBAL LIGATION     Patient Active Problem List   Diagnosis Date Noted   Impingement of left shoulder 10/29/2022   SI (sacroiliac) joint dysfunction 08/31/2020   Nonallopathic lesion of sacral region 08/31/2020   Nonallopathic lesion of lumbar region 08/31/2020   Nonallopathic lesion of thoracic region 08/31/2020   Hyperlipidemia associated with type 2 diabetes mellitus (HCC) 03/02/2019   Hyperlipidemia 03/04/2016   IBS (irritable bowel syndrome) 01/16/2015   Essential hypertension 01/16/2015   Unspecified hypothyroidism 03/05/2014   Statin intolerance 03/05/2014   History of recurrent UTIs 06/10/2013   Pre-diabetes 02/10/2013   Hypothyroidism    ADVERSE REACTION TO MEDICATION 05/09/2010   ALLERGIC RHINITIS, SEASONAL, MILD 09/08/2009   CARCINOMA, SKIN, SQUAMOUS CELL 05/03/2009   URINARY INCONTINENCE, URGE, MILD 05/03/2009   HYPERLIPIDEMIA 05/07/2007   HYPERTENSION 05/07/2007   HYPERGLYCEMIA, FASTING  05/07/2007    PCP:  Madelin Headings, MD       REFERRING PROVIDER:   Judi Saa, DO    REFERRING DIAG:  Diagnosis  M99.9 (ICD-10-CM) - Nonallopathic lesion of thoracic region  M25.512 (ICD-10-CM) - Acute pain of left shoulder    THERAPY DIAG:  Pain in thoracic spine  Left shoulder pain, unspecified chronicity  Muscle weakness (generalized)  Difficulty walking  Rationale for Evaluation and Treatment: Rehabilitation  ONSET DATE: 03/2021  SUBJECTIVE:  SUBJECTIVE STATEMENT:   "My energy and stamina has increased.  I can now do 2 things in a day and not be utterly exhausted."    Pt reports less brain fog today, but more pain in back   PERTINENT HISTORY: Long COVID; back pain; brain fog of COVID; DM2  PAIN:  Are you having pain? yes: NPRS scale:3/10 Pain location:  back, thighs, generalized joints Pain description:achy Aggravating factors: exercising too long/ walking, over exertion, IR reaching Relieving factors: Tylenol  PRECAUTIONS: None  WEIGHT BEARING RESTRICTIONS: No  FALLS:  Has patient fallen in last 6 months? No  LIVING ENVIRONMENT: Lives with: lives with their family Lives in: House/apartment Stairs: No  Has following equipment at home: None  OCCUPATION: Retired Insurance claims handler 2x/week week Knits, does puzzles  PLOF: Independent  PATIENT GOALS: return to exercise, reduce pain    OBJECTIVE:   DIAGNOSTIC FINDINGS:  FINDINGS: The sacroiliac joint spaces are maintained and there is no evidence of arthropathy. No other bone abnormalities are seen.   IMPRESSION: Negative exam.  IMPRESSION: Transitional lumbosacral anatomy as described.   Mild degenerative disc disease L1-2 and L2-3. Lower lumbar facet arthropathy noted.   Aortic  Atherosclerosis (ICD10-I70.0).  PATIENT SURVEYS :  FOTO 78 66 @ DC  01/18/23:71%   COGNITION: Overall cognitive status: Within functional limits for tasks assessed     SENSATION: WFL  POSTURE: Mild kyphosis and rounded shoulders; no gross pelvic obliquity noted   UPPER EXTREMITY ROM: p! Into L deltoid   Active ROM Right eval Left eval  Shoulder flexion 160 160 p!  Shoulder extension    Shoulder abduction 165 165 p!  Shoulder adduction    Shoulder internal rotation 70 70 p!  Shoulder external rotation 80 80 p!  (Blank rows = not tested)    Thoracic ROM: WFL for all directions except for limitation and stiffness with extension  UPPER EXTREMITY MMT:  MMT Right eval Left eval  Shoulder flexion 4+/5 4/5 p!  Shoulder extension    Shoulder abduction 4+/5 4/5  Shoulder adduction 4+/5 4/5  Shoulder internal rotation 4+/5 4+/5  Shoulder external rotation 4+/5 4/5 p!  Middle trapezius 4+/5 4/5  (Blank rows = not tested)  SHOULDER SPECIAL TESTS: Impingement tests: Neer impingement test: positive , Hawkins/Kennedy impingement test: positive , and Painful arc test: positive  SLAP lesions: Biceps load test: negative Instability tests: Load and shift test: negative Rotator cuff assessment: Empty can test: positive , Full can test: positive , and External rotation lag sign: negative Biceps assessment: Speed's test: negative  JOINT MOBILITY TESTING:  Stiffness of L compared to R; no excessive laxity or signs of instability  PALPATION:  Globally tender through thoracic and lumbar paraspinals bilat; TTP of bilat UT, levator, and ant delt/biceps   TODAY'S TREATMENT: Pt seen for aquatic therapy today.  Treatment took place in water 3.25-4.5 ft in depth at the [facility] pool. Temp of water was 91.  Pt entered/exited the pool via stairs using alternating pattern with hand rail.  *without UE support forward/backward 2 laps, side stepping 2 laps, braiding 2 laps *  seated rest on bench * seated flutter 15s x 2, hip abdct/addct 15s x 2 * long sitting hamstring stretch  * opp arm and leg lift from plank at bench * standing quad stretch 20s x 2 each * curtsy lunges for ITB stretch * single leg clam holding wall  *SLS R 11s, L 21s   Pt requires the buoyancy and hydrostatic  pressure of water for support, and to offload joints by unweighting joint load by at least 50 % in navel deep water and by at least 75-80% in chest to neck deep water.  Viscosity of the water is needed for resistance of strengthening. Water current perturbations provides challenge to standing balance requiring increased core activation.  PATIENT EDUCATION: Education details: aquatics progressions and modifications Person educated: Patient Education method: Solicitor, Actor cues, Verbal cues, and Handouts Education comprehension: verbalized understanding, returned demonstration, verbal cues required, and tactile cues required   HOME EXERCISE PROGRAM:    Access Code: [email or web address] URL: [email or web address] Date: 12/04/2022 Prepared by: Valerie Brady  - Shoulder External Rotation and Scapular Retraction with Resistance  - 1 x daily - 7 x weekly - 2 sets - 10 reps - Standing Shoulder Horizontal Abduction with Resistance  - 1 x daily - 7 x weekly - 2 sets - 10 reps - Seated Thoracic Lumbar Extension  - 1 x daily - 7 x weekly - 1 sets - 10 reps - 5 hold   ASSESSMENT:   CLINICAL IMPRESSION: Pt reports daily improving toleration to activity since beginning of aquatic intervention. Her FOTO score went down, but it is also asking questions on shoulder and back; her main focus has been on reconditioning with long covid.  She required increased rest breaks today and exercises with less intensity.  No increase in pain, only fatigue.  Pt is making gradual progress towards goals and will benefit from continued PT intervention to maximize mobility and safety.    PN: Pt  has made significant gains in overall stamina and endurance with skilled physical therapy aquatic intervention. Overall pain has remained consistent in shoulder and back but she reports improved toleration to day to day activities with improved quality of life. She reports compliance with land based HEP and will be assigned and aquatic HEP when approp. Will plan to continue to focus on stamina with added focus on L rotator cuff strength and thoracic mobility  as she tolerates.   OBJECTIVE IMPAIRMENTS decreased balance, decreased coordination, decreased endurance, decreased knowledge of use of DME, decreased mobility, difficulty walking, decreased strength, decreased safety awareness, impaired sensation, impaired UE functional use, improper body mechanics, postural dysfunction, obesity, and pain.    ACTIVITY LIMITATIONS carrying, lifting, squatting, stairs, transfers, reach over head, and locomotion level   PARTICIPATION LIMITATIONS: cleaning, laundry, community activity, and yard work   PERSONAL FACTORS Education, Fitness, Past/current experiences, Time since onset of injury/illness/exacerbation, and 1-2 comorbidities are also affecting patient's functional outcome.    REHAB POTENTIAL: good   CLINICAL DECISION MAKING: unstable/complicated   EVALUATION COMPLEXITY: moderate     GOALS:     SHORT TERM GOALS: Target date: 01/15/2023       Pt will become independent with HEP in order to demonstrate synthesis of PT education.   Goal status: Met 01/18/23  2. Pt will report at least 2 pt reduction on NPRS scale for pain in order to demonstrate functional improvement with household activity, self care, and ADL.   Goal status: MET - 01/18/23           LONG TERM GOALS: Target date: 02/26/2023        Pt  will become independent with final HEP in order to demonstrate synthesis of PT education.   Goal status: INITIAL   2.   Pt will be able to reach [facility] and carry/hold >5 lbs in order to  demonstrate functional improvement in L UE strength for  return to PLOF and exercise.     Goal status: MET 01/18/23   3. Pt will score >/= 66 on FOTO to demonstrate improvement in perceived L shoulder function.    Goal status: Ongoing 01/18/23  4. Pt will be able to demonstrate/report ability to walk >20 mins without pain in order to demonstrate functional improvement and tolerance to exercise and community mobility.  Goal status:Ongoing 01/18/23         PLAN: PT FREQUENCY: 1x/week   PT DURATION: 12 weeks (likely DC in 8-10)   PLANNED INTERVENTIONS: Therapeutic exercises, Therapeutic activity, Neuromuscular re-education, Balance training, Gait training, Patient/Family education, Joint mobilization, Stair training, DME instructions, Aquatic Therapy, Dry Needling, Electrical stimulation, Cryotherapy, Moist heat, Taping, Ultrasound, Ionotophoresis 4mg /ml Dexamethasone, Manual therapy, and Re-evaluation   PLAN FOR NEXT SESSION:  start aquatics: general conditioning and light LE strength/endurance (will need slow progression due to increase in globalized pain with activity); thoracic mobility, L rotator cuff strength  Valerie Brady, PTA 01/21/23 9:27 AM [facility] [address] [place], [place], [address] Phone: [phone]   Fax:  [phone]  Addended 01/21/23 Valerie Brady) [place] MPT

## 2023-01-22 ENCOUNTER — Encounter (HOSPITAL_BASED_OUTPATIENT_CLINIC_OR_DEPARTMENT_OTHER): Payer: Self-pay

## 2023-01-22 ENCOUNTER — Ambulatory Visit (HOSPITAL_BASED_OUTPATIENT_CLINIC_OR_DEPARTMENT_OTHER): Payer: Medicare Other | Admitting: Physical Therapy

## 2023-01-25 ENCOUNTER — Encounter (HOSPITAL_BASED_OUTPATIENT_CLINIC_OR_DEPARTMENT_OTHER): Payer: Self-pay

## 2023-01-25 ENCOUNTER — Ambulatory Visit (HOSPITAL_BASED_OUTPATIENT_CLINIC_OR_DEPARTMENT_OTHER): Payer: Medicare Other | Admitting: Physical Therapy

## 2023-01-31 ENCOUNTER — Encounter (HOSPITAL_BASED_OUTPATIENT_CLINIC_OR_DEPARTMENT_OTHER): Payer: Self-pay | Admitting: Physical Therapy

## 2023-01-31 ENCOUNTER — Ambulatory Visit (HOSPITAL_BASED_OUTPATIENT_CLINIC_OR_DEPARTMENT_OTHER): Payer: Medicare Other | Admitting: Physical Therapy

## 2023-01-31 DIAGNOSIS — M25512 Pain in left shoulder: Secondary | ICD-10-CM

## 2023-01-31 DIAGNOSIS — R262 Difficulty in walking, not elsewhere classified: Secondary | ICD-10-CM

## 2023-01-31 DIAGNOSIS — M6281 Muscle weakness (generalized): Secondary | ICD-10-CM

## 2023-01-31 DIAGNOSIS — M546 Pain in thoracic spine: Secondary | ICD-10-CM | POA: Diagnosis not present

## 2023-01-31 NOTE — Therapy (Signed)
OUTPATIENT PHYSICAL THERAPY UPPER EXTREMITY TREATMENT  Patient Name: Valerie Brady MRN: [id number] DOB:May 16, 1946, 77 y.o., female Today's Date: 01/31/2023  END OF SESSION:  PT End of Session - 01/31/23 1448     Visit Number 11    Number of Visits 19    Date for PT Re-Evaluation 03/04/23    Authorization Type MCR    PT Start Time 1445    PT Stop Time 1525    PT Time Calculation (min) 40 min    Activity Tolerance Patient tolerated treatment well    Behavior During Therapy [facility] for tasks assessed/performed            Past Medical History:  Diagnosis Date   Carotidynia    Diverticulitis of colon 05/22/2015   Gestational diabetes    Hyperlipidemia    Hypertension    Hypothyroidism    Syncope 2011   reaction to benicar   Past Surgical History:  Procedure Laterality Date   cervical polyp removed     FIBULA FRACTURE SURGERY     OPEN REDUCTION AND PINNING OF SPIRAL [place] LEFT FIBULA   TONSILLECTOMY     TUBAL LIGATION     Patient Active Problem List   Diagnosis Date Noted   Impingement of left shoulder 10/29/2022   SI (sacroiliac) joint dysfunction 08/31/2020   Nonallopathic lesion of sacral region 08/31/2020   Nonallopathic lesion of lumbar region 08/31/2020   Nonallopathic lesion of thoracic region 08/31/2020   Hyperlipidemia associated with type 2 diabetes mellitus ([place]) 03/02/2019   Hyperlipidemia 03/04/2016   IBS (irritable bowel syndrome) 01/16/2015   Essential hypertension 01/16/2015   Unspecified hypothyroidism 03/05/2014   Statin intolerance 03/05/2014   History of recurrent UTIs 06/10/2013   Pre-diabetes 02/10/2013   Hypothyroidism    ADVERSE REACTION TO MEDICATION 05/09/2010   ALLERGIC RHINITIS, SEASONAL, MILD 09/08/2009   CARCINOMA, SKIN, SQUAMOUS CELL 05/03/2009   URINARY INCONTINENCE, URGE, MILD 05/03/2009   HYPERLIPIDEMIA 05/07/2007   HYPERTENSION 05/07/2007   HYPERGLYCEMIA, FASTING 05/07/2007    PCP:  Burnis Medin, MD        REFERRING PROVIDER:   Lyndal Pulley, DO    REFERRING DIAG:  Diagnosis  M99.9 (ICD-10-CM) - Nonallopathic lesion of thoracic region  M25.512 (ICD-10-CM) - Acute pain of left shoulder    THERAPY DIAG:  Pain in thoracic spine  Left shoulder pain, unspecified chronicity  Muscle weakness (generalized)  Difficulty walking  Rationale for Evaluation and Treatment: Rehabilitation  ONSET DATE: 03/2021  SUBJECTIVE:  SUBJECTIVE STATEMENT:   Pt reports she missed last weeks appointments due to illness.  Today her legs became "achy" when arriving to locker room after walking from parking lot, but it has resolved since resting prior to session. She has 3 trips planned this year and plans to have a month of rest in between each.   PERTINENT HISTORY: Long COVID; back pain; brain fog of COVID; DM2  PAIN:  Are you having pain? no: NPRS scale:0/10 Pain location:  Pain description: Aggravating factors: exercising too long/ walking, over exertion, IR reaching Relieving factors: Tylenol  PRECAUTIONS: None  WEIGHT BEARING RESTRICTIONS: No  FALLS:  Has patient fallen in last 6 months? No  LIVING ENVIRONMENT: Lives with: lives with their family Lives in: House/apartment Stairs: No  Has following equipment at home: None  OCCUPATION: Retired Corporate investment banker 2x/week week Knits, does puzzles  PLOF: Independent  PATIENT GOALS: return to exercise, reduce pain    OBJECTIVE:   DIAGNOSTIC FINDINGS:  FINDINGS: The sacroiliac joint spaces are maintained and there is no evidence of arthropathy. No other bone abnormalities are seen.   IMPRESSION: Negative exam.  IMPRESSION: Transitional lumbosacral anatomy as described.   Mild degenerative disc disease L1-2 and L2-3. Lower lumbar  facet arthropathy noted.   Aortic Atherosclerosis (ICD10-I70.0).  PATIENT SURVEYS :  FOTO 78 66 @ DC  01/18/23:71%   COGNITION: Overall cognitive status: Within functional limits for tasks assessed     SENSATION: WFL  POSTURE: Mild kyphosis and rounded shoulders; no gross pelvic obliquity noted   UPPER EXTREMITY ROM: p! Into L deltoid   Active ROM Right eval Left eval  Shoulder flexion 160 160 p!  Shoulder extension    Shoulder abduction 165 165 p!  Shoulder adduction    Shoulder internal rotation 70 70 p!  Shoulder external rotation 80 80 p!  (Blank rows = not tested)    Thoracic ROM: WFL for all directions except for limitation and stiffness with extension  UPPER EXTREMITY MMT:  MMT Right eval Left eval  Shoulder flexion 4+/5 4/5 p!  Shoulder extension    Shoulder abduction 4+/5 4/5  Shoulder adduction 4+/5 4/5  Shoulder internal rotation 4+/5 4+/5  Shoulder external rotation 4+/5 4/5 p!  Middle trapezius 4+/5 4/5  (Blank rows = not tested)  SHOULDER SPECIAL TESTS: Impingement tests: Neer impingement test: positive , Hawkins/Kennedy impingement test: positive , and Painful arc test: positive  SLAP lesions: Biceps load test: negative Instability tests: Load and shift test: negative Rotator cuff assessment: Empty can test: positive , Full can test: positive , and External rotation lag sign: negative Biceps assessment: Speed's test: negative  JOINT MOBILITY TESTING:  Stiffness of L compared to R; no excessive laxity or signs of instability  PALPATION:  Globally tender through thoracic and lumbar paraspinals bilat; TTP of bilat UT, levator, and ant delt/biceps   TODAY'S TREATMENT: Pt seen for aquatic therapy today.  Treatment took place in water 3.25-4.5 ft in depth at the [facility]. Temp of water was 91.  Pt entered/exited the pool via stairs using alternating pattern with hand rail.  *without UE support forward/backward 2 laps,  *  with rainbow hand floats- side step into squat with shoulder addct/abdct x 2 laps * marching with arm swing x 2 laps;  braiding 2 laps * rest break with knees to chest at wall * tricep press downs x 12; wide stance and bilat shoulder horiz abdct/addct  * holding rainbow hand floats:  single leg  clams x 10; SLS with 3 way SL kick x 10,  * rest break with knees to chest at wall  *curtsy lunges for ITB stretch x 10 each LE * seated rest on noodle with LE suspended * straddling yellow noodle: cycling gently x 30 sec; rest; cycling at fast pace (5/10 borg); rest * back near wall with light UE support - SLS with noodle press (slow controlled pace) x 10  Pt requires the buoyancy and hydrostatic pressure of water for support, and to offload joints by unweighting joint load by at least 50 % in navel deep water and by at least 75-80% in chest to neck deep water.  Viscosity of the water is needed for resistance of strengthening. Water current perturbations provides challenge to standing balance requiring increased core activation.  PATIENT EDUCATION: Education details: aquatics progressions and modifications Person educated: Patient Education method: Education officer, environmental, Corporate treasurer cues, Verbal cues, and Handouts Education comprehension: verbalized understanding, returned demonstration, verbal cues required, and tactile cues required   HOME EXERCISE PROGRAM:    Access Code: [place]: [email or web address] Date: 12/04/2022 Prepared by: Daleen Bo  - Shoulder External Rotation and Scapular Retraction with Resistance  - 1 x daily - 7 x weekly - 2 sets - 10 reps - Standing Shoulder Horizontal Abduction with Resistance  - 1 x daily - 7 x weekly - 2 sets - 10 reps - Seated Thoracic Lumbar Extension  - 1 x daily - 7 x weekly - 1 sets - 10 reps - 5 hold   ASSESSMENT:   CLINICAL IMPRESSION: Pt was able to complete greater volume of exercises before requiring rest breaks today.  She  gives feedback when she is fatiguing and in need of rest break. She reported slight knee discomfort upon completion of session, but no overall increase in pain. She begins land PT next session.    Pt is making gradual progress towards goals and will benefit from continued PT intervention to maximize mobility and safety.  Pt has partially met her goals.      OBJECTIVE IMPAIRMENTS decreased balance, decreased coordination, decreased endurance, decreased knowledge of use of DME, decreased mobility, difficulty walking, decreased strength, decreased safety awareness, impaired sensation, impaired UE functional use, improper body mechanics, postural dysfunction, obesity, and pain.    ACTIVITY LIMITATIONS carrying, lifting, squatting, stairs, transfers, reach over head, and locomotion level   PARTICIPATION LIMITATIONS: cleaning, laundry, community activity, and yard work   PERSONAL FACTORS Education, Fitness, Past/current experiences, Time since onset of injury/illness/exacerbation, and 1-2 comorbidities are also affecting patient's functional outcome.    REHAB POTENTIAL: good   CLINICAL DECISION MAKING: unstable/complicated   EVALUATION COMPLEXITY: moderate     GOALS:     SHORT TERM GOALS: Target date: 01/15/2023       Pt will become independent with HEP in order to demonstrate synthesis of PT education.   Goal status: Met 01/18/23  2. Pt will report at least 2 pt reduction on NPRS scale for pain in order to demonstrate functional improvement with household activity, self care, and ADL.   Goal status: MET - 01/18/23           LONG TERM GOALS: Target date: 02/26/2023        Pt  will become independent with final HEP in order to demonstrate synthesis of PT education.   Goal status: INITIAL   2.   Pt will be able to reach [facility] and carry/hold >5 lbs in order to demonstrate functional improvement in L UE  strength for return to PLOF and exercise.     Goal status: MET 01/18/23   3. Pt will  score >/= 66 on FOTO to demonstrate improvement in perceived L shoulder function.    Goal status: Ongoing 01/18/23  4. Pt will be able to demonstrate/report ability to walk >20 mins without pain in order to demonstrate functional improvement and tolerance to exercise and community mobility.  Goal status:Ongoing 01/18/23         PLAN: PT FREQUENCY: 1x/week   PT DURATION: 12 weeks (likely DC in 8-10)   PLANNED INTERVENTIONS: Therapeutic exercises, Therapeutic activity, Neuromuscular re-education, Balance training, Gait training, Patient/Family education, Joint mobilization, Stair training, DME instructions, Aquatic Therapy, Dry Needling, Electrical stimulation, Cryotherapy, Moist heat, Taping, Ultrasound, Ionotophoresis 4mg /ml Dexamethasone, Manual therapy, and Re-evaluation   PLAN FOR NEXT SESSION:  general conditioning and light LE strength/endurance (will need slow progression due to increase in globalized pain with activity)   Kerin Perna, PTA 01/31/23 6:04 PM [place] [address] [place], [place], [address] Phone: [phone]   Fax:  [phone]

## 2023-02-04 ENCOUNTER — Encounter (HOSPITAL_BASED_OUTPATIENT_CLINIC_OR_DEPARTMENT_OTHER): Payer: Self-pay

## 2023-02-04 ENCOUNTER — Ambulatory Visit (HOSPITAL_BASED_OUTPATIENT_CLINIC_OR_DEPARTMENT_OTHER): Payer: Medicare Other | Admitting: Physical Therapy

## 2023-02-14 ENCOUNTER — Encounter: Payer: Self-pay | Admitting: Internal Medicine

## 2023-02-14 DIAGNOSIS — E039 Hypothyroidism, unspecified: Secondary | ICD-10-CM

## 2023-02-14 DIAGNOSIS — Z79899 Other long term (current) drug therapy: Secondary | ICD-10-CM

## 2023-02-15 ENCOUNTER — Ambulatory Visit (HOSPITAL_BASED_OUTPATIENT_CLINIC_OR_DEPARTMENT_OTHER): Payer: Medicare Other | Attending: Family Medicine | Admitting: Physical Therapy

## 2023-02-15 ENCOUNTER — Encounter (HOSPITAL_BASED_OUTPATIENT_CLINIC_OR_DEPARTMENT_OTHER): Payer: Self-pay | Admitting: Physical Therapy

## 2023-02-15 DIAGNOSIS — M546 Pain in thoracic spine: Secondary | ICD-10-CM

## 2023-02-15 DIAGNOSIS — M25512 Pain in left shoulder: Secondary | ICD-10-CM | POA: Diagnosis not present

## 2023-02-15 DIAGNOSIS — R262 Difficulty in walking, not elsewhere classified: Secondary | ICD-10-CM | POA: Diagnosis not present

## 2023-02-15 DIAGNOSIS — M6281 Muscle weakness (generalized): Secondary | ICD-10-CM

## 2023-02-15 NOTE — Therapy (Signed)
OUTPATIENT PHYSICAL THERAPY UPPER EXTREMITY TREATMENT  Patient Name: Valerie Brady MRN: [id number] DOB:09-29-1946, 77 y.o., female Today's Date: 02/15/2023  END OF SESSION:  PT End of Session - 02/15/23 1129     Visit Number 12    Number of Visits 19    Date for PT Re-Evaluation 03/04/23    Authorization Type MCR    PT Start Time 1115    PT Stop Time 1155    PT Time Calculation (min) 40 min    Activity Tolerance Patient tolerated treatment well    [facility] for tasks assessed/performed            Past Medical History:  Diagnosis Date   Carotidynia    Diverticulitis of colon 05/22/2015   Gestational diabetes    Hyperlipidemia    Hypertension    Hypothyroidism    Syncope 2011   reaction to benicar   Past Surgical History:  Procedure Laterality Date   cervical polyp removed     FIBULA FRACTURE SURGERY     OPEN REDUCTION AND PINNING OF SPIRAL FX LEFT FIBULA   TONSILLECTOMY     TUBAL LIGATION     Patient Active Problem List   Diagnosis Date Noted   Impingement of left shoulder 10/29/2022   SI (sacroiliac) joint dysfunction 08/31/2020   Nonallopathic lesion of sacral region 08/31/2020   Nonallopathic lesion of lumbar region 08/31/2020   Nonallopathic lesion of thoracic region 08/31/2020   Hyperlipidemia associated with type 2 diabetes mellitus 03/02/2019   Hyperlipidemia 03/04/2016   IBS (irritable bowel syndrome) 01/16/2015   Essential hypertension 01/16/2015   Unspecified hypothyroidism 03/05/2014   Statin intolerance 03/05/2014   History of recurrent UTIs 06/10/2013   Pre-diabetes 02/10/2013   Hypothyroidism    ADVERSE REACTION TO MEDICATION 05/09/2010   ALLERGIC RHINITIS, SEASONAL, MILD 09/08/2009   CARCINOMA, SKIN, SQUAMOUS CELL 05/03/2009   URINARY INCONTINENCE, URGE, MILD 05/03/2009   HYPERLIPIDEMIA 05/07/2007   HYPERTENSION 05/07/2007   HYPERGLYCEMIA, FASTING 05/07/2007    PCP:  Madelin HeadingsPanosh, Wanda K, MD       REFERRING  PROVIDER:   Judi SaaSmith, Zachary M, DO    REFERRING DIAG:  Diagnosis  M99.9 (ICD-10-CM) - Nonallopathic lesion of thoracic region  M25.512 (ICD-10-CM) - Acute pain of left shoulder    THERAPY DIAG:  Pain in thoracic spine  Left shoulder pain, unspecified chronicity  Muscle weakness (generalized)  Difficulty walking  Rationale for Evaluation and Treatment: Rehabilitation  ONSET DATE: 03/2021  SUBJECTIVE:  SUBJECTIVE STATEMENT:   Pt reports she has had 3 consecutive days without pain.  She joined [employer] 2 wks ago and has come to the pool 1x on own during member time.   PERTINENT HISTORY: Long COVID; back pain; brain fog of COVID; DM2  PAIN:  Are you having pain? no: NPRS scale:0/10 Pain location:  Pain description: Aggravating factors: exercising too long/ walking, over exertion, IR reaching Relieving factors: Tylenol  PRECAUTIONS: None  WEIGHT BEARING RESTRICTIONS: No  FALLS:  Has patient fallen in last 6 months? No  LIVING ENVIRONMENT: Lives with: lives with their family Lives in: House/apartment Stairs: No  Has following equipment at home: None  OCCUPATION: Retired Insurance claims handler 2x/week week Knits, does puzzles  PLOF: Independent  PATIENT GOALS: return to exercise, reduce pain    OBJECTIVE:   DIAGNOSTIC FINDINGS:  FINDINGS: The sacroiliac joint spaces are maintained and there is no evidence of arthropathy. No other bone abnormalities are seen.   IMPRESSION: Negative exam.  IMPRESSION: Transitional lumbosacral anatomy as described.   Mild degenerative disc disease L1-2 and L2-3. Lower lumbar facet arthropathy noted.   Aortic Atherosclerosis (ICD10-I70.0).  PATIENT SURVEYS :  FOTO 78 66 @ DC  01/18/23:71%   COGNITION: Overall cognitive status:  Within functional limits for tasks assessed     SENSATION: WFL  POSTURE: Mild kyphosis and rounded shoulders; no gross pelvic obliquity noted   UPPER EXTREMITY ROM: p! Into L deltoid   Active ROM Right eval Left eval  Shoulder flexion 160 160 p!  Shoulder extension    Shoulder abduction 165 165 p!  Shoulder adduction    Shoulder internal rotation 70 70 p!  Shoulder external rotation 80 80 p!  (Blank rows = not tested)    Thoracic ROM: WFL for all directions except for limitation and stiffness with extension  UPPER EXTREMITY MMT:  MMT Right eval Left eval  Shoulder flexion 4+/5 4/5 p!  Shoulder extension    Shoulder abduction 4+/5 4/5  Shoulder adduction 4+/5 4/5  Shoulder internal rotation 4+/5 4+/5  Shoulder external rotation 4+/5 4/5 p!  Middle trapezius 4+/5 4/5  (Blank rows = not tested)  SHOULDER SPECIAL TESTS: Impingement tests: Neer impingement test: positive , Hawkins/Kennedy impingement test: positive , and Painful arc test: positive  SLAP lesions: Biceps load test: negative Instability tests: Load and shift test: negative Rotator cuff assessment: Empty can test: positive , Full can test: positive , and External rotation lag sign: negative Biceps assessment: Speed's test: negative  JOINT MOBILITY TESTING:  Stiffness of L compared to R; no excessive laxity or signs of instability  PALPATION:  Globally tender through thoracic and lumbar paraspinals bilat; TTP of bilat UT, levator, and ant delt/biceps   TODAY'S TREATMENT: Pt seen for aquatic therapy today.  Treatment took place in water 3.25-4.5 ft in depth at the [facility] pool. Temp of water was 91.  Pt entered/exited the pool via stairs using alternating pattern with hand rail.  *without UE support forward/backward 3 laps; side stepping x 2 laps  * seated on noodle, with UE on rainbow: bilat horiz abdct/ addct x 10; bilat addct with squat x 10; bilat tricep push downs x 12 (standing) *  straddling yellow noodle: cycling gently x 30 sec; rest; cycling at fast pace ( no greater than 4/10 borg); rest - total of 4 min  * straddling yellow noodle: reverse jumping jacks and cc ski  - then rest * braiding 3 laps - rest * marching with  arm swing x 2 laps * rest break with knees to chest at wall   Pt requires the buoyancy and hydrostatic pressure of water for support, and to offload joints by unweighting joint load by at least 50 % in navel deep water and by at least 75-80% in chest to neck deep water.  Viscosity of the water is needed for resistance of strengthening. Water current perturbations provides challenge to standing balance requiring increased core activation.  PATIENT EDUCATION: Education details: aquatics progressions and modifications Person educated: Patient Education method: Solicitorxplanation, Demonstration, ActorTactile cues, Verbal cues, and Handouts Education comprehension: verbalized understanding, returned demonstration, verbal cues required, and tactile cues required   HOME EXERCISE PROGRAM:    Access Code: [email or web address] URL: [email or web address] Date: 12/04/2022 Prepared by: Zebedee IbaAlan Zhou  - Shoulder External Rotation and Scapular Retraction with Resistance  - 1 x daily - 7 x weekly - 2 sets - 10 reps - Standing Shoulder Horizontal Abduction with Resistance  - 1 x daily - 7 x weekly - 2 sets - 10 reps - Seated Thoracic Lumbar Extension  - 1 x daily - 7 x weekly - 1 sets - 10 reps - 5 hold   ASSESSMENT:   CLINICAL IMPRESSION: Pt was able to complete greater volume of exercises before requiring rest breaks today.  She gives feedback when she is fatiguing and in need of rest break. She reported fatigue in LEs upon completion of session, but no production of pain. She has land PT next session; will assess good exercises to complete (HEP) to maintain gains with consideration of not-over exerting. Pt has assessment with [employer] trainer coming up.    Pt will benefit  from continued PT intervention to maximize mobility and safety.  Pt has partially met her goals.      OBJECTIVE IMPAIRMENTS decreased balance, decreased coordination, decreased endurance, decreased knowledge of use of DME, decreased mobility, difficulty walking, decreased strength, decreased safety awareness, impaired sensation, impaired UE functional use, improper body mechanics, postural dysfunction, obesity, and pain.    ACTIVITY LIMITATIONS carrying, lifting, squatting, stairs, transfers, reach over head, and locomotion level   PARTICIPATION LIMITATIONS: cleaning, laundry, community activity, and yard work   PERSONAL FACTORS Education, Fitness, Past/current experiences, Time since onset of injury/illness/exacerbation, and 1-2 comorbidities are also affecting patient's functional outcome.    REHAB POTENTIAL: good   CLINICAL DECISION MAKING: unstable/complicated   EVALUATION COMPLEXITY: moderate     GOALS:     SHORT TERM GOALS: Target date: 01/15/2023       Pt will become independent with HEP in order to demonstrate synthesis of PT education.   Goal status: Met 01/18/23  2. Pt will report at least 2 pt reduction on NPRS scale for pain in order to demonstrate functional improvement with household activity, self care, and ADL.   Goal status: MET - 01/18/23           LONG TERM GOALS: Target date: 02/26/2023        Pt  will become independent with final HEP in order to demonstrate synthesis of PT education.   Goal status: INITIAL   2.   Pt will be able to reach [facility] and carry/hold >5 lbs in order to demonstrate functional improvement in L UE strength for return to PLOF and exercise.     Goal status: MET 01/18/23   3. Pt will score >/= 66 on FOTO to demonstrate improvement in perceived L shoulder function.    Goal status: Ongoing 01/18/23  4. Pt will  be able to demonstrate/report ability to walk >20 mins without pain in order to demonstrate functional improvement and tolerance  to exercise and community mobility.  Goal status:Ongoing 01/18/23         PLAN: PT FREQUENCY: 1x/week   PT DURATION: 12 weeks (likely DC in 8-10)   PLANNED INTERVENTIONS: Therapeutic exercises, Therapeutic activity, Neuromuscular re-education, Balance training, Gait training, Patient/Family education, Joint mobilization, Stair training, DME instructions, Aquatic Therapy, Dry Needling, Electrical stimulation, Cryotherapy, Moist heat, Taping, Ultrasound, Ionotophoresis 4mg /ml Dexamethasone, Manual therapy, and Re-evaluation   PLAN FOR NEXT SESSION:  general conditioning and light LE strength/endurance (will need slow progression due to increase in globalized pain with activity)  Mayer Camel, PTA 02/15/23 12:26 PM [facility] [address] [place], [place], [address] Phone: [phone]   Fax:  [phone]

## 2023-02-18 NOTE — Telephone Encounter (Signed)
Please add tsh to  cmp labs

## 2023-02-22 ENCOUNTER — Ambulatory Visit (HOSPITAL_BASED_OUTPATIENT_CLINIC_OR_DEPARTMENT_OTHER): Payer: Medicare Other | Admitting: Physical Therapy

## 2023-02-22 ENCOUNTER — Encounter (HOSPITAL_BASED_OUTPATIENT_CLINIC_OR_DEPARTMENT_OTHER): Payer: Self-pay | Admitting: Physical Therapy

## 2023-02-22 DIAGNOSIS — M546 Pain in thoracic spine: Secondary | ICD-10-CM

## 2023-02-22 DIAGNOSIS — M25512 Pain in left shoulder: Secondary | ICD-10-CM

## 2023-02-22 DIAGNOSIS — R262 Difficulty in walking, not elsewhere classified: Secondary | ICD-10-CM

## 2023-02-22 DIAGNOSIS — M6281 Muscle weakness (generalized): Secondary | ICD-10-CM

## 2023-02-22 NOTE — Therapy (Signed)
OUTPATIENT PHYSICAL THERAPY UPPER EXTREMITY TREATMENT  Patient Name: Valerie Brady MRN: [id number] DOB:11/18/1945, 77 y.o., female Today's Date: 02/22/2023  END OF SESSION:  PT End of Session - 02/22/23 1103     Visit Number 13    Number of Visits 19    Date for PT Re-Evaluation 03/04/23    Authorization Type MCR    PT Start Time 1102    PT Stop Time 1145    PT Time Calculation (min) 43 min    Activity Tolerance Patient tolerated treatment well    Behavior During Therapy [facility] for tasks assessed/performed            Past Medical History:  Diagnosis Date   Carotidynia    Diverticulitis of colon 05/22/2015   Gestational diabetes    Hyperlipidemia    Hypertension    Hypothyroidism    Syncope 2011   reaction to benicar   Past Surgical History:  Procedure Laterality Date   cervical polyp removed     FIBULA FRACTURE SURGERY     OPEN REDUCTION AND PINNING OF SPIRAL FX LEFT FIBULA   TONSILLECTOMY     TUBAL LIGATION     Patient Active Problem List   Diagnosis Date Noted   Impingement of left shoulder 10/29/2022   SI (sacroiliac) joint dysfunction 08/31/2020   Nonallopathic lesion of sacral region 08/31/2020   Nonallopathic lesion of lumbar region 08/31/2020   Nonallopathic lesion of thoracic region 08/31/2020   Hyperlipidemia associated with type 2 diabetes mellitus 03/02/2019   Hyperlipidemia 03/04/2016   IBS (irritable bowel syndrome) 01/16/2015   Essential hypertension 01/16/2015   Unspecified hypothyroidism 03/05/2014   Statin intolerance 03/05/2014   History of recurrent UTIs 06/10/2013   Pre-diabetes 02/10/2013   Hypothyroidism    ADVERSE REACTION TO MEDICATION 05/09/2010   ALLERGIC RHINITIS, SEASONAL, MILD 09/08/2009   CARCINOMA, SKIN, SQUAMOUS CELL 05/03/2009   URINARY INCONTINENCE, URGE, MILD 05/03/2009   HYPERLIPIDEMIA 05/07/2007   HYPERTENSION 05/07/2007   HYPERGLYCEMIA, FASTING 05/07/2007    PCP:  Madelin Headings, MD       REFERRING  PROVIDER:   Judi Saa, DO    REFERRING DIAG:  Diagnosis  M99.9 (ICD-10-CM) - Nonallopathic lesion of thoracic region  M25.512 (ICD-10-CM) - Acute pain of left shoulder    THERAPY DIAG:  Pain in thoracic spine  Left shoulder pain, unspecified chronicity  Muscle weakness (generalized)  Difficulty walking  Rationale for Evaluation and Treatment: Rehabilitation  ONSET DATE: 03/2021  SUBJECTIVE:  SUBJECTIVE STATEMENT:  Pt states things are going well. Working in the water has improved her strength. Pt has joined the gym and is working out in the pool and has an assessment with the personal trainer soon as well. Pt would like to return to swimming laps. Able to workout for 30 min with only 1 break. Pt states the shoulder is better but it will still hurt with sleeping on the R side. Pain occurs 1x/week at this point.   PERTINENT HISTORY: Long COVID; back pain; brain fog of COVID; DM2  PAIN:  Are you having pain? no: NPRS scale:0/10 Pain location:  Pain description: Aggravating factors: exercising too long/ walking, over exertion, IR reaching Relieving factors: Tylenol  PRECAUTIONS: None  WEIGHT BEARING RESTRICTIONS: No  FALLS:  Has patient fallen in last 6 months? No  LIVING ENVIRONMENT: Lives with: lives with their family Lives in: House/apartment Stairs: No  Has following equipment at home: None  OCCUPATION: Retired Insurance claims handler 2x/week week Knits, does puzzles  PLOF: Independent  PATIENT GOALS: return to exercise, reduce pain    OBJECTIVE:   DIAGNOSTIC FINDINGS:  FINDINGS: The sacroiliac joint spaces are maintained and there is no evidence of arthropathy. No other bone abnormalities are seen.   IMPRESSION: Negative exam.  IMPRESSION: Transitional  lumbosacral anatomy as described.   Mild degenerative disc disease L1-2 and L2-3. Lower lumbar facet arthropathy noted.   Aortic Atherosclerosis (ICD10-I70.0).  PATIENT SURVEYS :  FOTO 78 66 @ DC  01/18/23:71%   COGNITION: Overall cognitive status: Within functional limits for tasks assessed     SENSATION: WFL  POSTURE: Mild kyphosis and rounded shoulders; no gross pelvic obliquity noted   UPPER EXTREMITY ROM: p! Into L deltoid   Active ROM Right eval Left eval  Shoulder flexion 160 160 p!  Shoulder extension    Shoulder abduction 165 165 p!  Shoulder adduction    Shoulder internal rotation 70 70 p!  Shoulder external rotation 80 80 p!  (Blank rows = not tested)    Thoracic ROM: WFL for all directions except for limitation and stiffness with extension  UPPER EXTREMITY MMT:  MMT Right eval Left eval  Shoulder flexion 4+/5 4/5 p!  Shoulder extension    Shoulder abduction 4+/5 4/5  Shoulder adduction 4+/5 4/5  Shoulder internal rotation 4+/5 4+/5  Shoulder external rotation 4+/5 4/5 p!  Middle trapezius 4+/5 4/5  (Blank rows = not tested)  SHOULDER SPECIAL TESTS: Impingement tests: Neer impingement test: positive , Hawkins/Kennedy impingement test: positive , and Painful arc test: positive  SLAP lesions: Biceps load test: negative Instability tests: Load and shift test: negative Rotator cuff assessment: Empty can test: positive , Full can test: positive , and External rotation lag sign: negative Biceps assessment: Speed's test: negative  JOINT MOBILITY TESTING:  Stiffness of L compared to R; no excessive laxity or signs of instability  PALPATION:  Globally tender through thoracic and lumbar paraspinals bilat; TTP of bilat UT, levator, and ant delt/biceps   TODAY'S TREATMENT:  4/12  Nu-step L2 2.5 min x2   Seated RTB horizontal ABD 2x10 Seated bilt ER 2x10 RTB Standing blue TB row for home demo Seated unilateral cable row 3x8 12.5lbs  STS with  YTB at knees 3x8 Wall push up 2x10     Previous: Pt seen for aquatic therapy today.  Treatment took place in water 3.25-4.5 ft in depth at the [facility] pool. Temp of water was 91.  Pt entered/exited the pool via  stairs using alternating pattern with hand rail.  *without UE support forward/backward 3 laps; side stepping x 2 laps  * seated on noodle, with UE on rainbow: bilat horiz abdct/ addct x 10; bilat addct with squat x 10; bilat tricep push downs x 12 (standing) * straddling yellow noodle: cycling gently x 30 sec; rest; cycling at fast pace ( no greater than 4/10 borg); rest - total of 4 min  * straddling yellow noodle: reverse jumping jacks and cc ski  - then rest * braiding 3 laps - rest * marching with arm swing x 2 laps * rest break with knees to chest at wall   Pt requires the buoyancy and hydrostatic pressure of water for support, and to offload joints by unweighting joint load by at least 50 % in navel deep water and by at least 75-80% in chest to neck deep water.  Viscosity of the water is needed for resistance of strengthening. Water current perturbations provides challenge to standing balance requiring increased core activation.  PATIENT EDUCATION: Education details: exercise progression, DOMS expectations, muscle firing,  envelope of function, HEP, POC  Person educated: Patient Education method: Explanation, Demonstration, Tactile cues, Verbal cues, and Handouts Education comprehension: verbalized understanding, returned demonstration, verbal cues required, and tactile cues required   HOME EXERCISE PROGRAM:    Access Code: [email or web address] URL: [email or web address] Date: 12/04/2022 Prepared by: Zebedee Iba  - Shoulder External Rotation and Scapular Retraction with Resistance  - 1 x daily - 7 x weekly - 2 sets - 10 reps - Standing Shoulder Horizontal Abduction with Resistance  - 1 x daily - 7 x weekly - 2 sets - 10 reps - Seated Thoracic Lumbar  Extension  - 1 x daily - 7 x weekly - 1 sets - 10 reps - 5 hold   ASSESSMENT:   CLINICAL IMPRESSION: Pt returns to land visit today for progression of gym based program. Pt's L UE pain appears improved enough for her to start compound movements. Exercise program today started with basic pull, push, and legs type circuit for her to work on endurance and strength as she concurrently uses her aquatic exercise to continue with her functional capacity gains. No pain noted during session. Plan for pt to finish out aquatic setting as she feels independent with pool exercise and 1x/every other week for land in order to build land based strengthening and cardiovascular exercise concurrently with personal training. Begin transition and taper from therapy to personal training/independent gym exercise as tolerated. Plan for RPE based training.     Pt will benefit from continued PT intervention to maximize mobility and safety.  Pt has partially met her goals.      OBJECTIVE IMPAIRMENTS decreased balance, decreased coordination, decreased endurance, decreased knowledge of use of DME, decreased mobility, difficulty walking, decreased strength, decreased safety awareness, impaired sensation, impaired UE functional use, improper body mechanics, postural dysfunction, obesity, and pain.    ACTIVITY LIMITATIONS carrying, lifting, squatting, stairs, transfers, reach over head, and locomotion level   PARTICIPATION LIMITATIONS: cleaning, laundry, community activity, and yard work   PERSONAL FACTORS Education, Fitness, Past/current experiences, Time since onset of injury/illness/exacerbation, and 1-2 comorbidities are also affecting patient's functional outcome.    REHAB POTENTIAL: good   CLINICAL DECISION MAKING: unstable/complicated   EVALUATION COMPLEXITY: moderate     GOALS:     SHORT TERM GOALS: Target date: 01/15/2023       Pt will become independent with HEP in order to demonstrate synthesis of PT  education.  Goal status: Met 01/18/23  2. Pt will report at least 2 pt reduction on NPRS scale for pain in order to demonstrate functional improvement with household activity, self care, and ADL.   Goal status: MET - 01/18/23           LONG TERM GOALS: Target date: 02/26/2023        Pt  will become independent with final HEP in order to demonstrate synthesis of PT education.   Goal status: INITIAL   2.   Pt will be able to reach [facility] and carry/hold >5 lbs in order to demonstrate functional improvement in L UE strength for return to PLOF and exercise.     Goal status: MET 01/18/23   3. Pt will score >/= 66 on FOTO to demonstrate improvement in perceived L shoulder function.    Goal status: Ongoing 01/18/23  4. Pt will be able to demonstrate/report ability to walk >20 mins without pain in order to demonstrate functional improvement and tolerance to exercise and community mobility.  Goal status:Ongoing 01/18/23         PLAN: PT FREQUENCY: 1x/week   PT DURATION: 12 weeks (likely DC in 8-10)   PLANNED INTERVENTIONS: Therapeutic exercises, Therapeutic activity, Neuromuscular re-education, Balance training, Gait training, Patient/Family education, Joint mobilization, Stair training, DME instructions, Aquatic Therapy, Dry Needling, Electrical stimulation, Cryotherapy, Moist heat, Taping, Ultrasound, Ionotophoresis /ml Dexamethasone, Manual therapy, and Re-evaluation   PLAN FOR NEXT SESSION:  general conditioning and light LE strength/endurance (will need slow progression due to increase in globalized pain with activity)  Zebedee Iba PT, DPT 02/22/23 12:00 PM

## 2023-02-26 ENCOUNTER — Other Ambulatory Visit: Payer: Medicare Other

## 2023-02-27 ENCOUNTER — Other Ambulatory Visit (INDEPENDENT_AMBULATORY_CARE_PROVIDER_SITE_OTHER): Payer: Medicare Other

## 2023-02-27 DIAGNOSIS — Z79899 Other long term (current) drug therapy: Secondary | ICD-10-CM

## 2023-02-27 DIAGNOSIS — E039 Hypothyroidism, unspecified: Secondary | ICD-10-CM

## 2023-02-27 LAB — COMPREHENSIVE METABOLIC PANEL
ALT: 13 U/L (ref 0–35)
AST: 14 U/L (ref 0–37)
Albumin: 4.5 g/dL (ref 3.5–5.2)
Alkaline Phosphatase: 33 U/L — ABNORMAL LOW (ref 39–117)
BUN: 18 mg/dL (ref 6–23)
CO2: 25 mEq/L (ref 19–32)
Calcium: 9.8 mg/dL (ref 8.4–10.5)
Chloride: 102 mEq/L (ref 96–112)
Creatinine, Ser: 0.82 mg/dL (ref 0.40–1.20)
GFR: 70.01 mL/min (ref >60.00)
Glucose, Bld: 190 mg/dL — ABNORMAL HIGH (ref 70–99)
Potassium: 3.8 mEq/L (ref 3.5–5.1)
Sodium: 139 mEq/L (ref 135–145)
Total Bilirubin: 0.4 mg/dL (ref 0.2–1.2)
Total Protein: 7.6 g/dL (ref 6.0–8.3)

## 2023-02-27 LAB — TSH: TSH: 0.25 u[IU]/mL — ABNORMAL LOW (ref 0.35–5.50)

## 2023-02-28 NOTE — Therapy (Addendum)
OUTPATIENT PHYSICAL THERAPY UPPER EXTREMITY TREATMENT PHYSICAL THERAPY DISCHARGE SUMMARY  Visits from Start of Care: 14  Current functional level related to goals / functional outcomes: Pt is safe and indep with all ADL's and functional mobility   Remaining deficits: Residual fatigue   Education / Equipment: Management of condition/HEP   Patient agrees to discharge. Patient goals were partially met. Patient is being discharged due to being pleased with the current functional level.  Patient Name: Valerie Brady MRN: [id number] DOB:June 19, 1946, 77 y.o., female Today's Date: 03/01/2023  END OF SESSION:  PT End of Session - 03/01/23 1137     Visit Number 14    Number of Visits 19    Date for PT Re-Evaluation 03/04/23    Authorization Type MCR    PT Start Time 1116    PT Stop Time 1200    PT Time Calculation (min) 44 min    Activity Tolerance Patient tolerated treatment well    Behavior During Therapy [facility] for tasks assessed/performed            Past Medical History:  Diagnosis Date   Carotidynia    Diverticulitis of colon 05/22/2015   Gestational diabetes    Hyperlipidemia    Hypertension    Hypothyroidism    Syncope 2011   reaction to benicar   Past Surgical History:  Procedure Laterality Date   cervical polyp removed     FIBULA FRACTURE SURGERY     OPEN REDUCTION AND PINNING OF SPIRAL FX LEFT FIBULA   TONSILLECTOMY     TUBAL LIGATION     Patient Active Problem List   Diagnosis Date Noted   Impingement of left shoulder 10/29/2022   SI (sacroiliac) joint dysfunction 08/31/2020   Nonallopathic lesion of sacral region 08/31/2020   Nonallopathic lesion of lumbar region 08/31/2020   Nonallopathic lesion of thoracic region 08/31/2020   Hyperlipidemia associated with type 2 diabetes mellitus 03/02/2019   Hyperlipidemia 03/04/2016   IBS (irritable bowel syndrome) 01/16/2015   Essential hypertension 01/16/2015   Unspecified hypothyroidism 03/05/2014    Statin intolerance 03/05/2014   History of recurrent UTIs 06/10/2013   Pre-diabetes 02/10/2013   Hypothyroidism    ADVERSE REACTION TO MEDICATION 05/09/2010   ALLERGIC RHINITIS, SEASONAL, MILD 09/08/2009   CARCINOMA, SKIN, SQUAMOUS CELL 05/03/2009   URINARY INCONTINENCE, URGE, MILD 05/03/2009   HYPERLIPIDEMIA 05/07/2007   HYPERTENSION 05/07/2007   HYPERGLYCEMIA, FASTING 05/07/2007    PCP:  Madelin Headings, MD       REFERRING PROVIDER:   Judi Saa, DO    REFERRING DIAG:  Diagnosis  M99.9 (ICD-10-CM) - Nonallopathic lesion of thoracic region  M25.512 (ICD-10-CM) - Acute pain of left shoulder    THERAPY DIAG:  Pain in thoracic spine  Left shoulder pain, unspecified chronicity  Muscle weakness (generalized)  Difficulty walking  Rationale for Evaluation and Treatment: Rehabilitation  ONSET DATE: 03/2021  SUBJECTIVE:  SUBJECTIVE STATEMENT: I have had a really good couple of weeks  PERTINENT HISTORY: Long COVID; back pain; brain fog of COVID; DM2  PAIN:  Are you having pain? no: NPRS scale:0/10 Pain location:  Pain description: Aggravating factors: exercising too long/ walking, over exertion, IR reaching Relieving factors: Tylenol  PRECAUTIONS: None  WEIGHT BEARING RESTRICTIONS: No  FALLS:  Has patient fallen in last 6 months? No  LIVING ENVIRONMENT: Lives with: lives with their family Lives in: House/apartment Stairs: No  Has following equipment at home: None  OCCUPATION: Retired Insurance claims handler 2x/week week Knits, does puzzles  PLOF: Independent  PATIENT GOALS: return to exercise, reduce pain    OBJECTIVE:   DIAGNOSTIC FINDINGS:  FINDINGS: The sacroiliac joint spaces are maintained and there is no evidence of arthropathy. No other bone  abnormalities are seen.   IMPRESSION: Negative exam.  IMPRESSION: Transitional lumbosacral anatomy as described.   Mild degenerative disc disease L1-2 and L2-3. Lower lumbar facet arthropathy noted.   Aortic Atherosclerosis (ICD10-I70.0).  PATIENT SURVEYS :  FOTO 78 66 @ DC  01/18/23:71%   COGNITION: Overall cognitive status: Within functional limits for tasks assessed     SENSATION: WFL  POSTURE: Mild kyphosis and rounded shoulders; no gross pelvic obliquity noted   UPPER EXTREMITY ROM: p! Into L deltoid   Active ROM Right eval Left eval  Shoulder flexion 160 160 p!  Shoulder extension    Shoulder abduction 165 165 p!  Shoulder adduction    Shoulder internal rotation 70 70 p!  Shoulder external rotation 80 80 p!  (Blank rows = not tested)    Thoracic ROM: WFL for all directions except for limitation and stiffness with extension  UPPER EXTREMITY MMT:  MMT Right eval Left eval  Shoulder flexion 4+/5 4/5 p!  Shoulder extension    Shoulder abduction 4+/5 4/5  Shoulder adduction 4+/5 4/5  Shoulder internal rotation 4+/5 4+/5  Shoulder external rotation 4+/5 4/5 p!  Middle trapezius 4+/5 4/5  (Blank rows = not tested)  SHOULDER SPECIAL TESTS: Impingement tests: Neer impingement test: positive , Hawkins/Kennedy impingement test: positive , and Painful arc test: positive  SLAP lesions: Biceps load test: negative Instability tests: Load and shift test: negative Rotator cuff assessment: Empty can test: positive , Full can test: positive , and External rotation lag sign: negative Biceps assessment: Speed's test: negative  JOINT MOBILITY TESTING:  Stiffness of L compared to R; no excessive laxity or signs of instability  PALPATION:  Globally tender through thoracic and lumbar paraspinals bilat; TTP of bilat UT, levator, and ant delt/biceps   TODAY'S TREATMENT:  4/19  Pt seen for aquatic therapy today.  Treatment took place in water 3.25-4.5 ft in depth  at the [facility] pool. Temp of water was 91.  Pt entered/exited the pool via stairs using alternating pattern with hand rail.  - Side Stepping with Hand Floats  - 1 x daily - 7 x weekly - Braided Sidestepping - varing depths and speed - Walking March/skip - Standing Shoulder Horizontal Abduction with Resistance   - Staggered Stance Row with Kick Board   - Shoulder Extension with Pool Noodle Pull Down   - Sitting Balance with Arm Raise on Pool Noodle   - Seated Straddle on noodle Bicycle ; reverse jumping jacks; ski  - alternate speed with cycling   Pt requires the buoyancy and hydrostatic pressure of water for support, and to offload joints by unweighting joint load by at least 50 % in  navel deep water and by at least 75-80% in chest to neck deep water.  Viscosity of the water is needed for resistance of strengthening. Water current perturbations provides challenge to standing balance requiring increased core activation.  4/12  Nu-step L2 2.5 min x2   Seated RTB horizontal ABD 2x10 Seated bilt ER 2x10 RTB Standing blue TB row for home demo Seated unilateral cable row 3x8 12.5lbs  STS with YTB at knees 3x8 Wall push up 2x10    PATIENT EDUCATION: Education details: exercise progression, DOMS expectations, muscle firing,  envelope of function, HEP, POC  Person educated: Patient Education method: Explanation, Demonstration, Tactile cues, Verbal cues, and Handouts Education comprehension: verbalized understanding, returned demonstration, verbal cues required, and tactile cues required   HOME EXERCISE PROGRAM:    Access Code: [email or web address] URL: [email or web address] Date: 12/04/2022 Prepared by: Zebedee Iba  - Shoulder External Rotation and Scapular Retraction with Resistance  - 1 x daily - 7 x weekly - 2 sets - 10 reps - Standing Shoulder Horizontal Abduction with Resistance  - 1 x daily - 7 x weekly - 2 sets - 10 reps - Seated Thoracic Lumbar Extension  - 1 x  daily - 7 x weekly - 1 sets - 10 reps - 5 hold  Access Code: [email or web address] URL: [email or web address] Date: 03/01/2023 Prepared by: Geni Bers  Exercises - Side Stepping with Hand Floats  - 1 x daily - 7 x weekly - Braided Sidestepping  - 1 x daily - 7 x weekly - 3 sets - Walking March  - 1 x daily - 7 x weekly - Standing Shoulder Horizontal Abduction with Resistance  - 1 x daily - 7 x weekly - 1-2 sets - 10 reps - Staggered Stance Row with Kick Board  - 1 x daily - 7 x weekly - 1-2 sets - 10 reps - Shoulder Extension with Pool Noodle Pull Down  - 1 x daily - 7 x weekly - 1-2 sets - 10 reps - Sitting Balance with Arm Raise on Pool Noodle  - 1 x daily - 7 x weekly - 10 reps - Seated Straddle on noodle Bicycle ; reverse jumping jacks; ski  - alternate speed with cycling    This aquatic home exercise program from Med[employer] utilizes pictures from land based exercises, but has been adapted prior to lamination and issuance.   ASSESSMENT:   CLINICAL IMPRESSION: Pt presents for last aquatic session.  Final aquatic HEP is created, laminated and instructed.  She is directed through requires some cuing and demonstration for execution and clarification.  She tolerates entire session with 1-2  very short rest periods (30s) and without reports of fatigue upon completion which is a significant improvement from initiation of therapy where pt required 3-4 seated rest periods a few minutes ea.  She is compliant with a HEP land and water as instructed thus far.  She has reached her max potential in aquatics but will continue to benefit from land based intervention to progress towards her overall maximal potential and reach all goals stated.      OBJECTIVE IMPAIRMENTS decreased balance, decreased coordination, decreased endurance, decreased knowledge of use of DME, decreased mobility, difficulty walking, decreased strength, decreased safety awareness, impaired sensation, impaired UE  functional use, improper body mechanics, postural dysfunction, obesity, and pain.    ACTIVITY LIMITATIONS carrying, lifting, squatting, stairs, transfers, reach over head, and locomotion level   PARTICIPATION LIMITATIONS: cleaning, laundry, community activity, and yard work   PERSONAL Insurance underwriter, Fitness,  Past/current experiences, Time since onset of injury/illness/exacerbation, and 1-2 comorbidities are also affecting patient's functional outcome.    REHAB POTENTIAL: good   CLINICAL DECISION MAKING: unstable/complicated   EVALUATION COMPLEXITY: moderate     GOALS:     SHORT TERM GOALS: Target date: 01/15/2023       Pt will become independent with HEP in order to demonstrate synthesis of PT education.   Goal status: Met 01/18/23  2. Pt will report at least 2 pt reduction on NPRS scale for pain in order to demonstrate functional improvement with household activity, self care, and ADL.   Goal status: MET - 01/18/23           LONG TERM GOALS: Target date: 02/26/2023        Pt  will become independent with final HEP in order to demonstrate synthesis of PT education.   Goal status: INITIAL   2.   Pt will be able to reach [facility] and carry/hold >5 lbs in order to demonstrate functional improvement in L UE strength for return to PLOF and exercise.     Goal status: MET 01/18/23   3. Pt will score >/= 66 on FOTO to demonstrate improvement in perceived L shoulder function.    Goal status: Ongoing 01/18/23  4. Pt will be able to demonstrate/report ability to walk >20 mins without pain in order to demonstrate functional improvement and tolerance to exercise and community mobility.  Goal status:Ongoing 01/18/23         PLAN: PT FREQUENCY: 1x/week   PT DURATION: 12 weeks (likely DC in 8-10)   PLANNED INTERVENTIONS: Therapeutic exercises, Therapeutic activity, Neuromuscular re-education, Balance training, Gait training, Patient/Family education, Joint mobilization, Stair training,  DME instructions, Aquatic Therapy, Dry Needling, Electrical stimulation, Cryotherapy, Moist heat, Taping, Ultrasound, Ionotophoresis 4mg /ml Dexamethasone, Manual therapy, and Re-evaluation   PLAN FOR NEXT SESSION:  general conditioning and light LE strength/endurance (will need slow progression due to increase in globalized pain with activity)  Corrie Dandy Tomma Lightning) Khadim Lundberg MPT 03/01/23 11:45 AM  Addend Corrie Dandy Tomma Lightning) Sheriden Archibeque MPT 04/17/23 352p

## 2023-02-28 NOTE — Progress Notes (Signed)
Thyroid over suppressed . Please  clarify dosage of medication . May need to be adjusted

## 2023-03-01 ENCOUNTER — Encounter: Payer: Self-pay | Admitting: Internal Medicine

## 2023-03-01 ENCOUNTER — Ambulatory Visit (HOSPITAL_BASED_OUTPATIENT_CLINIC_OR_DEPARTMENT_OTHER): Payer: Medicare Other | Admitting: Physical Therapy

## 2023-03-01 ENCOUNTER — Encounter (HOSPITAL_BASED_OUTPATIENT_CLINIC_OR_DEPARTMENT_OTHER): Payer: Self-pay | Admitting: Physical Therapy

## 2023-03-01 DIAGNOSIS — R262 Difficulty in walking, not elsewhere classified: Secondary | ICD-10-CM | POA: Diagnosis not present

## 2023-03-01 DIAGNOSIS — M546 Pain in thoracic spine: Secondary | ICD-10-CM | POA: Diagnosis not present

## 2023-03-01 DIAGNOSIS — M6281 Muscle weakness (generalized): Secondary | ICD-10-CM

## 2023-03-01 DIAGNOSIS — M25512 Pain in left shoulder: Secondary | ICD-10-CM | POA: Diagnosis not present

## 2023-03-05 NOTE — Telephone Encounter (Signed)
K please update the med list since she is no longer taking cytomel.  Also tsh goal is supposed to be optimal around 2 level and not lower .   Glad the diabetes is doing ok .   I suggest we try taking the levothyroxine  6 days a week  (define day off on med list)  then recheck tsh in 3-4 months

## 2023-03-05 NOTE — Progress Notes (Unsigned)
[facility] [address] [place] [address] Phone: [phone] Subjective:   INadine Counts, am serving as a scribe for Dr. Antoine Primas.  I'm seeing this patient by the request  of:  Panosh, Neta Mends, MD  CC: Back pain and neck pain follow up   [id number]  Valerie Brady is a 77 y.o. female coming in with complaint of back and neck pain. OMT on 01/02/2023. Patient states doing significantly better since last visit. No new concerns.  Medications patient has been prescribed:   Taking:       Reviewed prior external information including notes and imaging from previsou exam, outside providers and external EMR if available.   As well as notes that were available from care everywhere and other healthcare systems.  Past medical history, social, surgical and family history all reviewed in electronic medical record.  No pertanent information unless stated regarding to the chief complaint.   Past Medical History:  Diagnosis Date   Carotidynia    Diverticulitis of colon 05/22/2015   Gestational diabetes    Hyperlipidemia    Hypertension    Hypothyroidism    Syncope 2011   reaction to benicar    Allergies  Allergen Reactions   Other Other (See Comments)   Bee Venom Swelling   Egg-Derived Products Nausea And Vomiting   Enalapril Maleate     REACTION: Cough   Enalapril Maleate Other (See Comments)    REACTION: Cough   Lactose Diarrhea   Lactose Intolerance (Gi) Diarrhea   Olmesartan Other (See Comments)    REACTION: Gas and chest pain   Olmesartan Medoxomil     REACTION: Gas and chest pain   Almond (Diagnostic) Rash   Almond Oil Rash   Amlodipine Other (See Comments)    Fatigue lethargy at 5 mg dose   Latex Rash   Zetia [Ezetimibe] Other (See Comments)    GI cramps     Objective  Blood pressure 130/76, pulse 89, height  (1.676 m), weight 166 lb (75.3 kg), SpO2 95 %.   General: No apparent distress alert and  oriented x3 mood and affect normal, dressed appropriately.  HEENT: Pupils equal, extraocular movements intact  Respiratory: Patient's speak in full sentences and does not appear short of breath  Cardiovascular: No lower extremity edema, non tender, no erythema  Low back has some very mild loss lordosis.  Some mild tightness around the sacroiliac joint.  Tightness more in the thoracolumbar juncture than anywhere else today.  Osteopathic findings  T9 extended rotated and side bent left L2 flexed rotated and side bent right Sacrum right on right     Assessment and Plan:  SI (sacroiliac) joint dysfunction Patient has made remarkable improvement over the course of time at this time.  And feeling like herself again and is starting to become more active.  Feels like the aquatic therapy did help her and become more movable.  Discussed with patient about icing regimen and home exercises.  We discussed which activities to do and which ones to avoid.  Follow-up with me again in 2 to 3 months.    Nonallopathic problems  Decision today to treat with OMT was based on Physical Exam  After verbal consent patient was treated with HVLA, ME, FPR techniques in cervical, rib, thoracic, lumbar, and sacral  areas  Patient tolerated the procedure well with improvement in symptoms  Patient given exercises, stretches and lifestyle modifications  See medications in patient instructions  if given  Patient will follow up in 4-8 weeks    The above documentation has been reviewed and is accurate and complete Lyndal Pulley, DO          Note: This dictation was prepared with Dragon dictation along with smaller phrase technology. Any transcriptional errors that result from this process are unintentional.

## 2023-03-06 ENCOUNTER — Encounter: Payer: Self-pay | Admitting: Family Medicine

## 2023-03-06 ENCOUNTER — Ambulatory Visit (INDEPENDENT_AMBULATORY_CARE_PROVIDER_SITE_OTHER): Payer: Medicare Other | Admitting: Family Medicine

## 2023-03-06 ENCOUNTER — Other Ambulatory Visit: Payer: Self-pay

## 2023-03-06 VITALS — BP 130/76 | HR 89 | Ht 66.0 in | Wt 166.0 lb

## 2023-03-06 DIAGNOSIS — M9902 Segmental and somatic dysfunction of thoracic region: Secondary | ICD-10-CM

## 2023-03-06 DIAGNOSIS — M9903 Segmental and somatic dysfunction of lumbar region: Secondary | ICD-10-CM | POA: Diagnosis not present

## 2023-03-06 DIAGNOSIS — M533 Sacrococcygeal disorders, not elsewhere classified: Secondary | ICD-10-CM

## 2023-03-06 DIAGNOSIS — M9904 Segmental and somatic dysfunction of sacral region: Secondary | ICD-10-CM

## 2023-03-06 NOTE — Telephone Encounter (Signed)
Med list updated

## 2023-03-06 NOTE — Assessment & Plan Note (Signed)
Patient has made remarkable improvement over the course of time at this time.  And feeling like herself again and is starting to become more active.  Feels like the aquatic therapy did help her and become more movable.  Discussed with patient about icing regimen and home exercises.  We discussed which activities to do and which ones to avoid.  Follow-up with me again in 2 to 3 months.

## 2023-03-13 ENCOUNTER — Encounter (HOSPITAL_BASED_OUTPATIENT_CLINIC_OR_DEPARTMENT_OTHER): Payer: Medicare Other | Admitting: Physical Therapy

## 2023-04-03 ENCOUNTER — Encounter (HOSPITAL_BASED_OUTPATIENT_CLINIC_OR_DEPARTMENT_OTHER): Payer: Medicare Other | Admitting: Physical Therapy

## 2023-04-16 ENCOUNTER — Telehealth: Payer: Self-pay

## 2023-04-16 NOTE — Telephone Encounter (Signed)
-----   Message from Angela H Herring, RPH sent at 04/16/2023 12:05 PM EDT ----- Regarding: 2300 Pharmacy Referral Hello,  Patient is currently flagged as high risk for hospitalization and is eligible to meet with me through the Care Coordination program.  Could a 2300-Pharmacy referral for medication management be placed for the patient so that I can get them scheduled?  Thank you! Angela H Herring Clinical Pharmacist [phone]  

## 2023-04-17 ENCOUNTER — Encounter (HOSPITAL_BASED_OUTPATIENT_CLINIC_OR_DEPARTMENT_OTHER): Payer: Medicare Other | Admitting: Physical Therapy

## 2023-04-17 NOTE — Telephone Encounter (Signed)
Ask patient if she is interested in referral  She is a retired Sport and exercise psychologist . If she is ok I approve

## 2023-04-23 ENCOUNTER — Encounter: Payer: Self-pay | Admitting: Internal Medicine

## 2023-04-24 ENCOUNTER — Telehealth (HOSPITAL_BASED_OUTPATIENT_CLINIC_OR_DEPARTMENT_OTHER): Payer: Self-pay | Admitting: *Deleted

## 2023-04-24 NOTE — Telephone Encounter (Signed)
Patient called and stated she has onset of vaginal bleeding and needs be seen soon as possible .

## 2023-05-01 ENCOUNTER — Encounter (HOSPITAL_BASED_OUTPATIENT_CLINIC_OR_DEPARTMENT_OTHER): Payer: Self-pay | Admitting: Obstetrics & Gynecology

## 2023-05-01 ENCOUNTER — Other Ambulatory Visit (HOSPITAL_COMMUNITY)
Admission: RE | Admit: 2023-05-01 | Discharge: 2023-05-01 | Disposition: A | Discharge status: Home / Self Care | Payer: Medicare Other | Source: Ambulatory Visit | Attending: Obstetrics & Gynecology | Admitting: Obstetrics & Gynecology

## 2023-05-01 ENCOUNTER — Ambulatory Visit (INDEPENDENT_AMBULATORY_CARE_PROVIDER_SITE_OTHER): Payer: Medicare Other | Admitting: Obstetrics & Gynecology

## 2023-05-01 ENCOUNTER — Ambulatory Visit (INDEPENDENT_AMBULATORY_CARE_PROVIDER_SITE_OTHER): Payer: Medicare Other

## 2023-05-01 VITALS — BP 134/61 | HR 72 | Ht 66.0 in | Wt 167.6 lb

## 2023-05-01 DIAGNOSIS — N950 Postmenopausal bleeding: Secondary | ICD-10-CM | POA: Insufficient documentation

## 2023-05-01 DIAGNOSIS — Z124 Encounter for screening for malignant neoplasm of cervix: Secondary | ICD-10-CM | POA: Insufficient documentation

## 2023-05-01 DIAGNOSIS — Z01419 Encounter for gynecological examination (general) (routine) without abnormal findings: Secondary | ICD-10-CM | POA: Insufficient documentation

## 2023-05-01 DIAGNOSIS — N858 Other specified noninflammatory disorders of uterus: Secondary | ICD-10-CM | POA: Diagnosis not present

## 2023-05-01 DIAGNOSIS — Z1151 Encounter for screening for human papillomavirus (HPV): Secondary | ICD-10-CM | POA: Diagnosis not present

## 2023-05-01 NOTE — Progress Notes (Signed)
GYNECOLOGY  VISIT  CC:   PMP bleeding  HPI: 77 y.o.  Married White or Caucasian female here for complaint of PMP bleeding that started about a week ago.  She had a lot flank pain that started a few days before the bleeding started.  She is using vaginal estrogen cream and uses this three times weekly.  She has been using this for recurrent UTIs.  This has really helped as well as cranberry tablets.    She took HRT for three years but stopped after [facility] came out and she stopped it then.     Past Medical History:  Diagnosis Date   Carotidynia    Diverticulitis of colon 05/22/2015   Gestational diabetes    Hyperlipidemia    Hypertension    Hypothyroidism    Syncope 2011   reaction to benicar    MEDS:   Current Outpatient Medications on File Prior to Visit  Medication Sig Dispense Refill   Alirocumab (PRALUENT) 150 MG/ML SOAJ INJECT 1 ML(150 MG) UNDER THE SKIN EVERY 14 DAYS 2 mL 5   carvedilol (COREG) 12.5 MG tablet Take 1 tablet (12.5 mg total) by mouth 2 (two) times daily with a meal. 180 tablet 3   cholecalciferol (VITAMIN D) 1000 units tablet Take 2,000 Units by mouth daily.      co-enzyme Q-10 30 MG capsule Take 30 mg by mouth once.     COVID-19 mRNA bivalent vaccine, Pfizer, (PFIZER COVID-19 VAC BIVALENT) injection Inject into the muscle. 0.3 mL 0   Cranberry-Vitamin C-Vitamin E (CRANBERRY PLUS VITAMIN C) 4200-20-3 MG-MG-UNIT CAPS Take by mouth.     Cyanocobalamin (B-12 PO) Take 1 tablet by mouth daily. Once weekly     estradiol (ESTRACE) 0.1 MG/GM vaginal cream INSERT 1 gram VAGINALLY 3 TIMES WEEKLY as directed. 42.5 g 6   gabapentin (NEURONTIN) 300 MG capsule TAKE (1) CAPSULE THREE TIMES DAILY,OR AS DIRECTED 90 capsule 0   LANCETS MICRO THIN 33G MISC Use to test blood glucose once daily. 100 each 3   levothyroxine (SYNTHROID) 112 MCG tablet TAKE ONE TABLET BY MOUTH DAILY 90 tablet 3   Melatonin 5 MG TABS Take 5 mg by mouth at bedtime.     metFORMIN (GLUCOPHAGE) 1000 MG tablet  Take 1 tablet (1,000 mg total) by mouth 2 (two) times daily. 180 tablet 1   ondansetron (ZOFRAN) 4 MG tablet Take 1 tablet (4 mg total) by mouth every 8 (eight) hours as needed for nausea or vomiting. 20 tablet 1   Probiotic Product (ALIGN PO) Take by mouth.     TRUE METRIX BLOOD GLUCOSE TEST test strip CHECK BLOOD SUGAR ONCE A DAY. 50 each PRN   No current facility-administered medications on file prior to visit.    ALLERGIES: Other, Bee venom, Egg-derived products, Enalapril maleate, Enalapril maleate, Lactose, Lactose intolerance (gi), Olmesartan, Olmesartan medoxomil, Almond (diagnostic), Almond oil, Amlodipine, Latex, and Zetia [ezetimibe]  SH:  married, non smoker  Review of Systems  Constitutional: Negative.   Genitourinary:        PMP bleeding    PHYSICAL EXAMINATION:    BP 134/61 (BP Location: Left Arm, Patient Position: Sitting, Cuff Size: Large)   Pulse 72   Ht 5\' 6"  (1.676 m) Comment: Reported  Wt 167 lb 9.6 oz (76 kg)   BMI 27.05 kg/m     General appearance: alert, cooperative and appears stated age Lymph:  no inguinal LAD noted  Pelvic: External genitalia:  no lesions  Urethra:  normal appearing urethra with no masses, tenderness or lesions              Bartholins and Skenes: normal                 Vagina: normal appearing vagina with normal color and discharge, no lesions              Cervix: no lesions              Bimanual Exam:  Uterus:  normal size, contour, position, consistency, mobility, non-tender              Adnexa: no mass, fullness, tenderness          Endometrial biopsy recommended.  Discussed with patient.  Verbal and written consent obtained.   Procedure:  Speculum placed.  Cervix visualized and cleansed with betadine prep.  A single toothed tenaculum was applied to the anterior lip of the cervix.  Endometrial pipelle was advanced through the cervix into the endometrial cavity without difficulty.  Pipelle passed to 7cm.  Suction applied  and pipelle removed with good tissue sample obtained.  Tenculum removed.  No bleeding noted.  Patient tolerated procedure well.  Chaperone, Ina Homes, CMA, was present for exam.  Due to availability with ultrasound, pt returned for afternoon ultrasound.  This showed thickened endometrium.  Assessment/Plan: 1. Postmenopausal bleeding - pap smear, endometrial biopsy obtained today.  Ultrasound showed thickened endometrium.  If biopsy is normal/negative, hysteroscopy will be recommended as well. - Cytology - PAP( [place]) - Surgical pathology( [place]/ [facility]) - US PELVIS TRANSVAGINAL NON-OB (TV ONLY); Future  2. Cervical cancer screening - Cytology - PAP( [place])

## 2023-05-02 IMAGING — CT CT CARDIAC CORONARY ARTERY CALCIUM SCORE
3 series · 13 of 20 positions shown, 15 images · non-contrast
Comparison: None.

Addendum:
CLINICAL DATA: Cardiovascular Disease Risk stratification

EXAM:
Coronary Calcium Score
TECHNIQUE: A gated, non-contrast computed tomography scan of the heart was
performed using 3mm slice thickness. Axial images were analyzed on a
dedicated workstation. Calcium scoring of the coronary arteries was
performed using the Agatston method.

[Series 2: cascseq 2.0 sa36 70% (id) · axial · 0.39mm/px · z∈[-249,-195]mm · 3 of 68 slices shown]
[im 14/68  vessel]
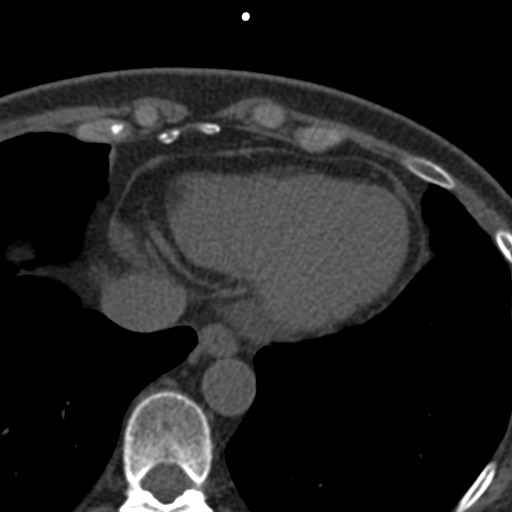
[im 27/68  vessel]
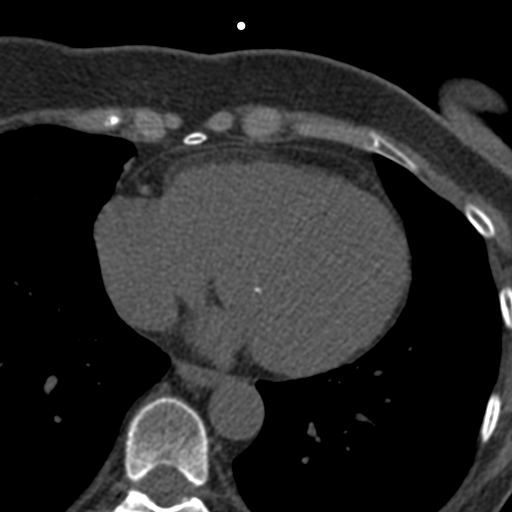
[im 41/68  vessel]
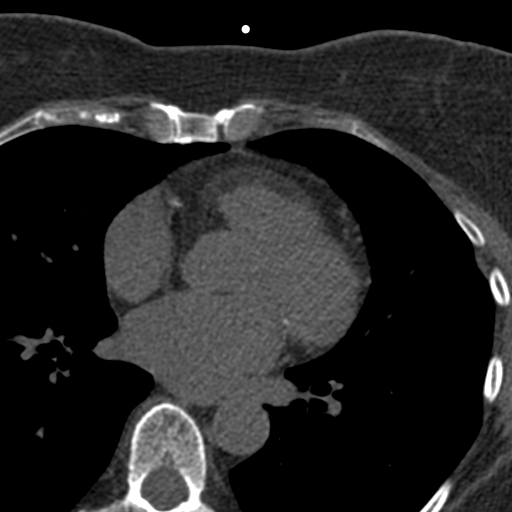

[Series 3: cascseq 2.0 bf37 st · axial · 0.67mm/px · z∈[-253,-165]mm · 5 of 68 slices shown, 7 images]
[im 12/68  vessel]
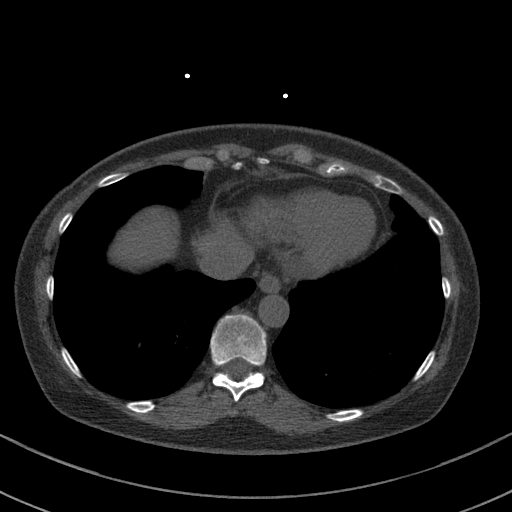
[im 12/68  lung]
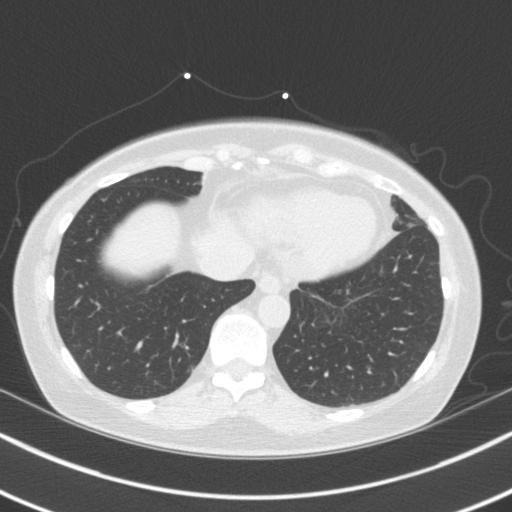
[im 23/68  vessel]
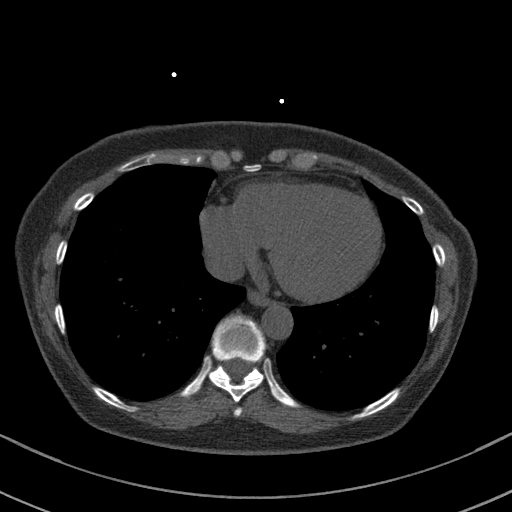
[im 34/68  vessel]
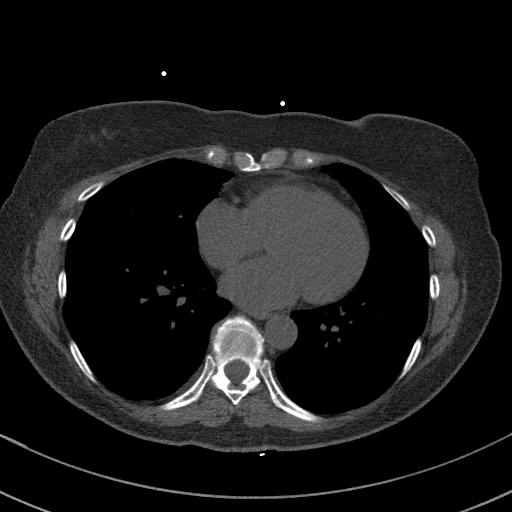
[im 45/68  vessel]
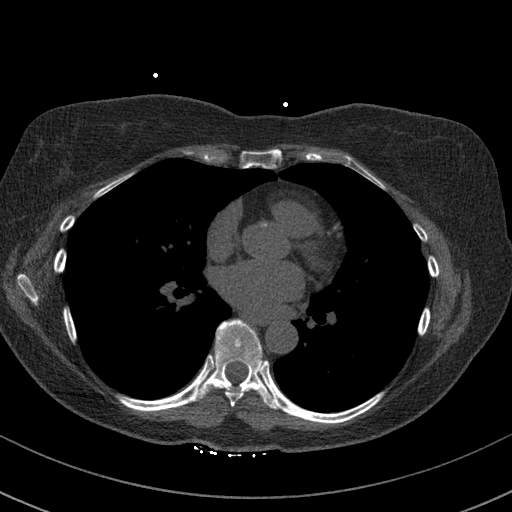
[im 56/68  vessel]
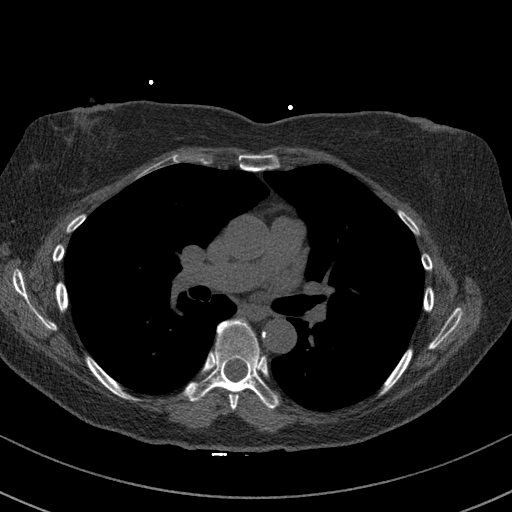
[im 56/68  lung]
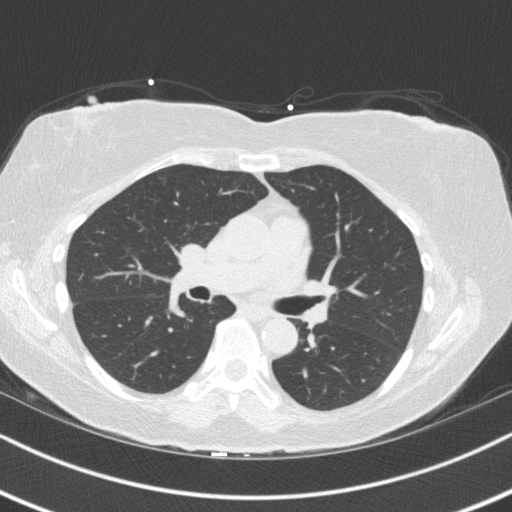

[Series 4: cascseq 2.0 br59 lung · axial · 0.67mm/px · z∈[-253,-165]mm · 5 of 68 slices shown]
[im 12/68  lung]
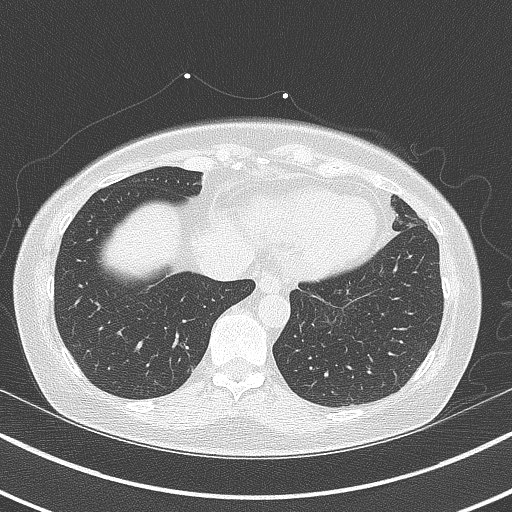
[im 23/68  lung]
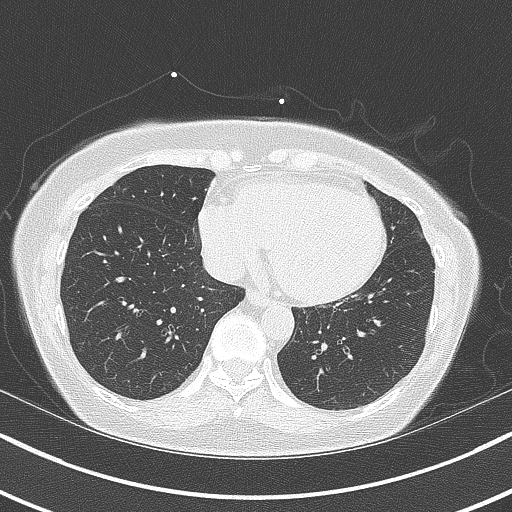
[im 34/68  lung]
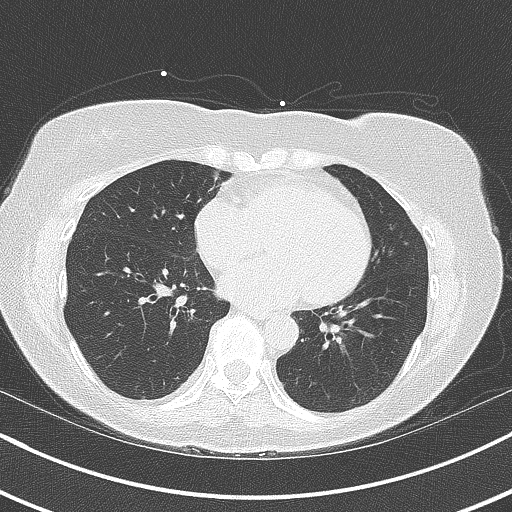
[im 45/68  lung]
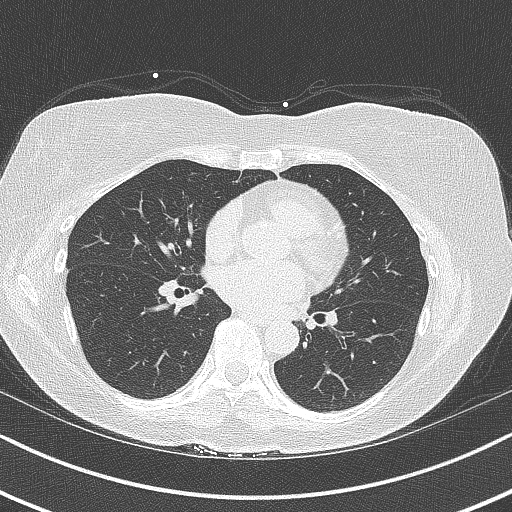
[im 56/68  lung]
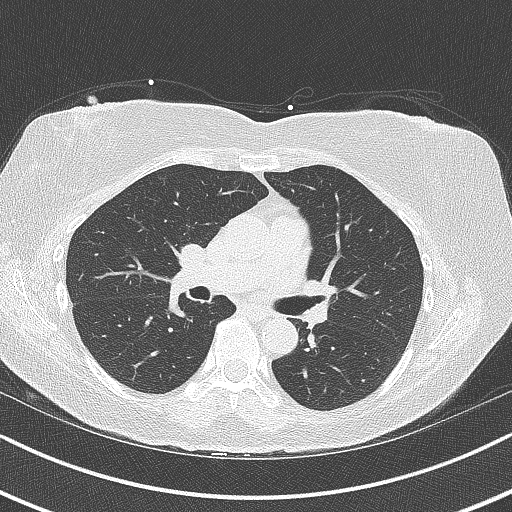

[13 of 20 positions shown; findings below may reference images not displayed]

FINDINGS: Coronary arteries: Normal origins.

Coronary Calcium Score:

Left main: 0

Left anterior descending artery:

Left circumflex artery:

Right coronary artery:

Total:

Percentile: 60th

Pericardium: Normal.

Ascending Aorta: Normal caliber. Scattered calcifications in the
ascending and descending aorta.

Mitral Annular Calcifications are present.

Non-cardiac: See separate report from [REDACTED].
IMPRESSION: Coronary calcium score of 74.3. This was 60th percentile for age-,
race-, and sex-matched controls.



If CAC=0, it is reasonable to withhold statin therapy and reassess
in 5 to 10 years, as long as higher risk conditions are absent
(diabetes mellitus, family history of premature CHD in first degree
relatives (males <55 years; females <65 years), cigarette smoking,
or LDL >=190 mg/dL).

If CAC is 1 to 99, it is reasonable to initiate statin therapy for
patients >=55 years of age.

If CAC is >=100 or >=75th percentile, it is reasonable to initiate
statin therapy at any age.

Cardiology referral should be considered for patients with CAC
scores >=400 or >=75th percentile.

*4925 AHA/ACC/AACVPR/AAPA/ABC/EEVU/ROGATO/MOHOR/Qingbo/WARAICH/TARLA/LAI KUEN
Guideline on the Management of Blood Cholesterol: A Report of the
American College of Cardiology/American Heart Association Task Force
on Clinical Practice Guidelines. J Am Coll Cardiol.
8974;73(24):4866-4395.

EXAM:
OVER-READ INTERPRETATION  CT CHEST

The following report is an over-read performed by radiologist Dr.
does not include interpretation of cardiac or coronary anatomy or
pathology. The coronary calcium score interpretation by the
cardiologist is attached.
FINDINGS: Visualized mediastinal structures are normal. Images of the upper
abdomen are unremarkable. 2 mm nodular density in the right lower
lobe on sequence 4 image 47. Probable punctate calcified granuloma
in the left lower lobe on sequence 4, image 64. Scattered dependent
densities in both lower lobes are suggestive for areas of
atelectasis. No pleural effusions. No acute bone abnormality.
IMPRESSION: 1. No acute extracardiac abnormality.
2. Small nodular densities in lungs that could be incidental
findings but indeterminate. No follow-up needed if patient is
low-risk (and has no known or suspected primary neoplasm).
Non-contrast chest CT can be considered in 12 months if patient is
high-risk. This recommendation follows the consensus statement:
Guidelines for Management of Incidental Pulmonary Nodules Detected
[DATE].

*** End of Addendum ***
FINDINGS: Coronary arteries: Normal origins.

Coronary Calcium Score:

Left main: 0

Left anterior descending artery:

Left circumflex artery:

Right coronary artery:

Total:

Percentile: 60th

Pericardium: Normal.

Ascending Aorta: Normal caliber. Scattered calcifications in the
ascending and descending aorta.

Mitral Annular Calcifications are present.

Non-cardiac: See separate report from [REDACTED].
IMPRESSION: Coronary calcium score of 74.3. This was 60th percentile for age-,
race-, and sex-matched controls.



If CAC=0, it is reasonable to withhold statin therapy and reassess
in 5 to 10 years, as long as higher risk conditions are absent
(diabetes mellitus, family history of premature CHD in first degree
relatives (males <55 years; females <65 years), cigarette smoking,
or LDL >=190 mg/dL).

If CAC is 1 to 99, it is reasonable to initiate statin therapy for
patients >=55 years of age.

If CAC is >=100 or >=75th percentile, it is reasonable to initiate
statin therapy at any age.

Cardiology referral should be considered for patients with CAC
scores >=400 or >=75th percentile.

*4925 AHA/ACC/AACVPR/AAPA/ABC/EEVU/ROGATO/MOHOR/Qingbo/WARAICH/TARLA/LAI KUEN
Guideline on the Management of Blood Cholesterol: A Report of the
American College of Cardiology/American Heart Association Task Force
on Clinical Practice Guidelines. J Am Coll Cardiol.
8974;73(24):4866-4395.

## 2023-05-03 LAB — SURGICAL PATHOLOGY

## 2023-05-04 DIAGNOSIS — R350 Frequency of micturition: Secondary | ICD-10-CM | POA: Diagnosis not present

## 2023-05-04 DIAGNOSIS — R300 Dysuria: Secondary | ICD-10-CM | POA: Diagnosis not present

## 2023-05-04 DIAGNOSIS — N3000 Acute cystitis without hematuria: Secondary | ICD-10-CM | POA: Diagnosis not present

## 2023-05-06 ENCOUNTER — Encounter: Payer: Self-pay | Admitting: Internal Medicine

## 2023-05-07 ENCOUNTER — Ambulatory Visit: Payer: Medicare Other | Admitting: Internal Medicine

## 2023-05-07 LAB — CYTOLOGY - PAP
Comment: NEGATIVE
Diagnosis: NEGATIVE
High risk HPV: NEGATIVE

## 2023-05-07 MED ORDER — METFORMIN HCL 1000 MG PO TABS: 1000.0000 mg | ORAL_TABLET | Freq: Two times a day (BID) | ORAL | 1 refills | Status: DC

## 2023-05-13 NOTE — Progress Notes (Deleted)
  [facility] [address] [place] [address] Phone: [phone] Subjective:    I'm seeing this patient by the request  of:  Panosh, Neta Mends, MD  CC:   [id number]  [name] is a 77 y.o. female coming in with complaint of back and neck pain. OMT 03/06/2023. Patient states   Medications patient has been prescribed: None  Taking:         Reviewed prior external information including notes and imaging from previsou exam, outside providers and external EMR if available.   As well as notes that were available from care everywhere and other healthcare systems.  Past medical history, social, surgical and family history all reviewed in electronic medical record.  No pertanent information unless stated regarding to the chief complaint.   Past Medical History:  Diagnosis Date   Carotidynia    Diverticulitis of colon 05/22/2015   Gestational diabetes    Hyperlipidemia    Hypertension    Hypothyroidism    Syncope 2011   reaction to benicar    Allergies  Allergen Reactions   Other Other (See Comments)   Bee Venom Swelling   Egg-Derived Products Nausea And Vomiting   Enalapril Maleate     REACTION: Cough   Enalapril Maleate Other (See Comments)    REACTION: Cough   Lactose Diarrhea   Lactose Intolerance (Gi) Diarrhea   Olmesartan Other (See Comments)    REACTION: Gas and chest pain   Olmesartan Medoxomil     REACTION: Gas and chest pain   Almond (Diagnostic) Rash   Almond Oil Rash   Amlodipine Other (See Comments)    Fatigue lethargy at 5 mg dose   Latex Rash   Zetia [Ezetimibe] Other (See Comments)    GI cramps     Review of Systems:  No headache, visual changes, nausea, vomiting, diarrhea, constipation, dizziness, abdominal pain, skin rash, fevers, chills, night sweats, weight loss, swollen lymph nodes, body aches, joint swelling, chest pain, shortness of breath, mood changes. POSITIVE muscle aches  Objective   There were no vitals taken for this visit.   General: No apparent distress alert and oriented x3 mood and affect normal, dressed appropriately.  HEENT: Pupils equal, extraocular movements intact  Respiratory: Patient's speak in full sentences and does not appear short of breath  Cardiovascular: No lower extremity edema, non tender, no erythema  Gait MSK:  Back   Osteopathic findings  C2 flexed rotated and side bent right C6 flexed rotated and side bent left T3 extended rotated and side bent right inhaled rib T9 extended rotated and side bent left L2 flexed rotated and side bent right Sacrum right on right       Assessment and Plan:  No problem-specific Assessment & Plan notes found for this encounter.    Nonallopathic problems  Decision today to treat with OMT was based on Physical Exam  After verbal consent patient was treated with HVLA, ME, FPR techniques in cervical, rib, thoracic, lumbar, and sacral  areas  Patient tolerated the procedure well with improvement in symptoms  Patient given exercises, stretches and lifestyle modifications  See medications in patient instructions if given  Patient will follow up in 4-8 weeks             Note: This dictation was prepared with Dragon dictation along with smaller phrase technology. Any transcriptional errors that result from this process are unintentional.

## 2023-05-14 ENCOUNTER — Encounter (HOSPITAL_BASED_OUTPATIENT_CLINIC_OR_DEPARTMENT_OTHER): Payer: Self-pay | Admitting: Obstetrics & Gynecology

## 2023-05-15 ENCOUNTER — Ambulatory Visit: Payer: Medicare Other | Admitting: Family Medicine

## 2023-05-22 ENCOUNTER — Encounter (HOSPITAL_BASED_OUTPATIENT_CLINIC_OR_DEPARTMENT_OTHER): Payer: Self-pay

## 2023-05-22 ENCOUNTER — Other Ambulatory Visit (HOSPITAL_BASED_OUTPATIENT_CLINIC_OR_DEPARTMENT_OTHER): Payer: Medicare Other

## 2023-05-22 ENCOUNTER — Telehealth: Payer: Self-pay

## 2023-05-22 NOTE — Telephone Encounter (Signed)
Contacted patient to schedule her procedure w/ Dr. Hyacinth Meeker. Patient has two separate trips scheduled for July and August and was unavailable during the available OR dates. Patient was very kind and understanding and asked for a Sept. procedure date. I advised the Sept schedule is tentative and not yet official. Patient gave permission to schedule her on 07/16/23 if available in the future.

## 2023-06-05 ENCOUNTER — Telehealth: Payer: Self-pay

## 2023-06-05 ENCOUNTER — Other Ambulatory Visit (HOSPITAL_BASED_OUTPATIENT_CLINIC_OR_DEPARTMENT_OTHER): Payer: Self-pay | Admitting: Obstetrics & Gynecology

## 2023-06-05 DIAGNOSIS — Z01818 Encounter for other preprocedural examination: Secondary | ICD-10-CM

## 2023-06-05 NOTE — Telephone Encounter (Signed)
Spoke to pt. Pt was in [place] airport. Had a little time to answer a call.   Pt declined a referral medication management with pharmacist. She states" I'm okay right now".   Pt hang up the call quick as she proceeding to security at the airport.  Forwarding to provider FYI

## 2023-06-05 NOTE — Telephone Encounter (Signed)
Noted , she is a retired Development worker, community and can let us know if needs more help.

## 2023-06-05 NOTE — Telephone Encounter (Signed)
-----   Message from Berniece Andreas sent at 06/04/2023  6:10 PM EDT ----- Regarding: FW: 2300 Pharmacy Referral Ok to do referral if patient wishes ----- Message ----- From: Sherrill Raring, [facility] Sent: 04/16/2023  12:06 PM EDT To: Madelin Headings, MD; Lars Mage Subject: [phone] Pharmacy Referral                         Hello,  Patient is currently flagged as high risk for hospitalization and is eligible to meet with me through the Care Coordination program.  Could a 2300-Pharmacy referral for medication management be placed for the patient so that I can get them scheduled?  Thank you! Sherrill Raring Clinical Pharmacist [phone]

## 2023-06-07 ENCOUNTER — Encounter (HOSPITAL_BASED_OUTPATIENT_CLINIC_OR_DEPARTMENT_OTHER): Payer: Self-pay

## 2023-06-07 ENCOUNTER — Telehealth: Payer: Self-pay

## 2023-06-07 NOTE — Telephone Encounter (Signed)
Called patient to advise I was able to secure the 07/16/23 procedure date as requested w/ Dr. Hyacinth Meeker. Left a voicemail asking patient to call me to confirm.

## 2023-06-08 ENCOUNTER — Other Ambulatory Visit (HOSPITAL_BASED_OUTPATIENT_CLINIC_OR_DEPARTMENT_OTHER): Payer: Self-pay | Admitting: Obstetrics & Gynecology

## 2023-06-09 ENCOUNTER — Encounter: Payer: Self-pay | Admitting: Internal Medicine

## 2023-06-09 DIAGNOSIS — E039 Hypothyroidism, unspecified: Secondary | ICD-10-CM

## 2023-06-11 MED ORDER — LEVOTHYROXINE SODIUM 112 MCG PO TABS: 112.0000 ug | ORAL_TABLET | Freq: Every day | ORAL | 0 refills | Status: DC

## 2023-06-11 MED ORDER — LEVOTHYROXINE SODIUM 112 MCG PO TABS: 112.0000 ug | ORAL_TABLET | Freq: Every day | ORAL | Status: DC

## 2023-06-12 ENCOUNTER — Other Ambulatory Visit: Payer: Self-pay | Admitting: *Deleted

## 2023-06-12 DIAGNOSIS — E7849 Other hyperlipidemia: Secondary | ICD-10-CM

## 2023-06-13 MED ORDER — LEVOTHYROXINE SODIUM 112 MCG PO TABS: 112.0000 ug | ORAL_TABLET | Freq: Every day | ORAL | Status: DC

## 2023-06-18 MED ORDER — LEVOTHYROXINE SODIUM 100 MCG PO TABS: 100.0000 ug | ORAL_TABLET | Freq: Every day | ORAL | 2 refills | Status: DC

## 2023-06-18 NOTE — Telephone Encounter (Signed)
So ask what she is taking and correct the med list .  And send in the correct dose .

## 2023-06-18 NOTE — Telephone Encounter (Signed)
Ok to send in per day  levothyroxine disp 90 refill x 2  Delay getting tsh until on new dosing for 2-3 months.

## 2023-06-20 NOTE — Progress Notes (Signed)
[facility] [address] [place] [address] Phone: [phone] Subjective:   Valerie [facility], am serving as a scribe for Dr. Antoine Primas. I'm seeing this patient by the request  of:  Panosh, Neta Mends, MD  CC: Back and neck pain follow-up  [id number]  Valerie Brady is a 77 y.o. female coming in with complaint of back and neck pain. OMT on 03/06/2023. Patient states that her back pain has increased since last visit due to sitting more and traveling to [place].           Reviewed prior external information including notes and imaging from previsou exam, outside providers and external EMR if available.  Reviewing patient's EMR she is scheduled for a D&C in early September.  Recent medication change at the end of July switched from 125 mcg of levothyroxine to 100 mcg.  Patient is also scheduled to be in the hyperlipidemia clinic in the near future.  As well as notes that were available from care everywhere and other healthcare systems.  Past medical history, social, surgical and family history all reviewed in electronic medical record.  No pertanent information unless stated regarding to the chief complaint.   Past Medical History:  Diagnosis Date   Carotidynia    Diverticulitis of colon 05/22/2015   Gestational diabetes    Hyperlipidemia    Hypertension    Hypothyroidism    Syncope 2011   reaction to benicar    Allergies  Allergen Reactions   Other Other (See Comments)   Bee Venom Swelling   Egg-Derived Products Nausea And Vomiting   Enalapril Maleate     REACTION: Cough   Enalapril Maleate Other (See Comments)    REACTION: Cough   Lactose Diarrhea   Lactose Intolerance (Gi) Diarrhea   Olmesartan Other (See Comments)    REACTION: Gas and chest pain   Olmesartan Medoxomil     REACTION: Gas and chest pain   Almond (Diagnostic) Rash   Almond Oil Rash   Amlodipine Other (See Comments)    Fatigue lethargy at 5 mg dose    Latex Rash   Zetia [Ezetimibe] Other (See Comments)    GI cramps     Review of Systems:  No headache, visual changes, nausea, vomiting, diarrhea, constipation, dizziness, abdominal pain, skin rash, fevers, chills, night sweats, weight loss, swollen lymph nodes, body aches, joint swelling, chest pain, shortness of breath, mood changes. POSITIVE muscle aches  Objective  Blood pressure 122/82, pulse 73, height 5\' 6"  (1.676 m), weight 157 lb (71.2 kg), SpO2 98%.   General: No apparent distress alert and oriented x3 mood and affect normal, dressed appropriately.  HEENT: Pupils equal, extraocular movements intact  Respiratory: Patient's speak in full sentences and does not appear short of breath  Cardiovascular: No lower extremity edema, non tender, no erythema  Back exam still has tightness noted mostly over the sacroiliac joint.  Tenderness to palpation diffusely along this joint.  Patient does have discomfort in the paraspinal musculature.  Osteopathic findings  C6 flexed rotated and side bent right T3 extended rotated and side bent right inhaled rib T9 extended rotated and side bent left L2 flexed rotated and side bent right L5 flexed rotated and side bent left Sacrum right on right       Assessment and Plan:  SI (sacroiliac) joint dysfunction Chronic, similar tightness secondary to patient's actually traveling recently.  Discussed icing regimen and home exercises.  We discussed with patient that I  do think she is doing well and will follow-up again in 10 to 12 weeks.  Any worsening pain can see me sooner.    Nonallopathic problems  Decision today to treat with OMT was based on Physical Exam  After verbal consent patient was treated with HVLA, ME, FPR techniques in cervical, rib, thoracic, lumbar, and sacral  areas  Patient tolerated the procedure well with improvement in symptoms  Patient given exercises, stretches and lifestyle modifications  See medications in patient  instructions if given  Patient will follow up in  10-12 weeks     The above documentation has been reviewed and is accurate and complete Judi Saa, DO         Note: This dictation was prepared with Dragon dictation along with smaller phrase technology. Any transcriptional errors that result from this process are unintentional.

## 2023-06-21 ENCOUNTER — Encounter: Payer: Self-pay | Admitting: Family Medicine

## 2023-06-21 ENCOUNTER — Ambulatory Visit (INDEPENDENT_AMBULATORY_CARE_PROVIDER_SITE_OTHER): Payer: Medicare Other | Admitting: Family Medicine

## 2023-06-21 VITALS — BP 122/82 | HR 73 | Ht 66.0 in | Wt 157.0 lb

## 2023-06-21 DIAGNOSIS — M9901 Segmental and somatic dysfunction of cervical region: Secondary | ICD-10-CM | POA: Diagnosis not present

## 2023-06-21 DIAGNOSIS — M533 Sacrococcygeal disorders, not elsewhere classified: Secondary | ICD-10-CM | POA: Diagnosis not present

## 2023-06-21 DIAGNOSIS — M9908 Segmental and somatic dysfunction of rib cage: Secondary | ICD-10-CM | POA: Diagnosis not present

## 2023-06-21 DIAGNOSIS — M9904 Segmental and somatic dysfunction of sacral region: Secondary | ICD-10-CM | POA: Diagnosis not present

## 2023-06-21 DIAGNOSIS — M9902 Segmental and somatic dysfunction of thoracic region: Secondary | ICD-10-CM

## 2023-06-21 DIAGNOSIS — M9903 Segmental and somatic dysfunction of lumbar region: Secondary | ICD-10-CM | POA: Diagnosis not present

## 2023-06-21 NOTE — Patient Instructions (Signed)
Good to see you See me again in 10-12 weeks

## 2023-06-21 NOTE — Assessment & Plan Note (Signed)
Chronic, similar tightness secondary to patient's actually traveling recently.  Discussed icing regimen and home exercises.  We discussed with patient that I do think she is doing well and will follow-up again in 10 to 12 weeks.  Any worsening pain can see me sooner.

## 2023-06-24 ENCOUNTER — Telehealth: Payer: Self-pay

## 2023-06-24 ENCOUNTER — Other Ambulatory Visit (HOSPITAL_COMMUNITY): Payer: Self-pay

## 2023-06-24 ENCOUNTER — Telehealth: Payer: Self-pay | Admitting: Internal Medicine

## 2023-06-24 NOTE — Telephone Encounter (Signed)
Pt c/o medication issue:  1. Name of Medication: Alirocumab (PRALUENT) 150 MG/ML SOAJ   2. How are you currently taking this medication (dosage and times per day)?    3. Are you having a reaction (difficulty breathing--STAT)? no  4. What is your medication issue? Prior [facility]erie Brady is needed. Please advise

## 2023-06-24 NOTE — Telephone Encounter (Signed)
Pharmacy Patient Advocate Encounter   Received notification from Physician's Office-LPN Staley, Demetris   that prior authorization for PRALUENT is required/requested.   Insurance verification completed.   The patient is insured through [facility] .   Per test claim: PA required; PA submitted to [facility] via CoverMyMeds Key/confirmation #/EOC [facility]   Status is pending

## 2023-06-24 NOTE — Telephone Encounter (Signed)
Patient needs prior auth

## 2023-06-25 ENCOUNTER — Other Ambulatory Visit (HOSPITAL_COMMUNITY): Payer: Self-pay

## 2023-06-25 NOTE — Telephone Encounter (Signed)
Pharmacy Patient Advocate Encounter  Received notification from [facility] that Prior Authorization for Praluent 150MG /ML auto-injectors has been APPROVED from 06/24/2023 to 11/12/2023. Ran test claim, Copay is $45.00. This test claim was processed through [facility]- copay amounts may vary at other pharmacies due to pharmacy/plan contracts, or as the patient moves through the different stages of their insurance plan.   PA #/Case ID/Reference #:  [id number]

## 2023-06-26 ENCOUNTER — Encounter (HOSPITAL_BASED_OUTPATIENT_CLINIC_OR_DEPARTMENT_OTHER): Payer: Self-pay

## 2023-06-26 ENCOUNTER — Ambulatory Visit (HOSPITAL_BASED_OUTPATIENT_CLINIC_OR_DEPARTMENT_OTHER): Admit: 2023-06-26 | Payer: Medicare Other | Admitting: Obstetrics & Gynecology

## 2023-06-26 SURGERY — DILATION AND EVACUATION, UTERUS
Anesthesia: Choice

## 2023-06-27 ENCOUNTER — Encounter (INDEPENDENT_AMBULATORY_CARE_PROVIDER_SITE_OTHER): Payer: Self-pay

## 2023-07-04 ENCOUNTER — Encounter (HOSPITAL_BASED_OUTPATIENT_CLINIC_OR_DEPARTMENT_OTHER): Payer: Self-pay | Admitting: Obstetrics & Gynecology

## 2023-07-08 ENCOUNTER — Encounter (HOSPITAL_BASED_OUTPATIENT_CLINIC_OR_DEPARTMENT_OTHER): Payer: Self-pay

## 2023-07-08 ENCOUNTER — Encounter (HOSPITAL_BASED_OUTPATIENT_CLINIC_OR_DEPARTMENT_OTHER): Payer: Self-pay | Admitting: Obstetrics & Gynecology

## 2023-07-08 ENCOUNTER — Telehealth (HOSPITAL_BASED_OUTPATIENT_CLINIC_OR_DEPARTMENT_OTHER): Payer: Self-pay | Admitting: *Deleted

## 2023-07-08 ENCOUNTER — Other Ambulatory Visit (HOSPITAL_COMMUNITY)
Admission: RE | Admit: 2023-07-08 | Discharge: 2023-07-08 | Disposition: A | Discharge status: Home / Self Care | Payer: Medicare Other | Source: Ambulatory Visit | Attending: Obstetrics & Gynecology | Admitting: Obstetrics & Gynecology

## 2023-07-08 ENCOUNTER — Ambulatory Visit (INDEPENDENT_AMBULATORY_CARE_PROVIDER_SITE_OTHER): Payer: Medicare Other | Admitting: Obstetrics & Gynecology

## 2023-07-08 VITALS — BP 145/66 | HR 74 | Ht 66.0 in | Wt 169.0 lb

## 2023-07-08 DIAGNOSIS — R3915 Urgency of urination: Secondary | ICD-10-CM

## 2023-07-08 DIAGNOSIS — R300 Dysuria: Secondary | ICD-10-CM

## 2023-07-08 DIAGNOSIS — N898 Other specified noninflammatory disorders of vagina: Secondary | ICD-10-CM | POA: Insufficient documentation

## 2023-07-08 LAB — POCT URINALYSIS DIPSTICK
Bilirubin, UA: NEGATIVE
Glucose, UA: NEGATIVE
Ketones, UA: NEGATIVE
Leukocytes, UA: NEGATIVE
Nitrite, UA: NEGATIVE
Protein, UA: POSITIVE — AB
Spec Grav, UA: >=1.03 — AB (ref 1.010–1.025)
Urobilinogen, UA: 0.2 E.U./dL
pH, UA: 5.5 (ref 5.0–8.0)

## 2023-07-08 MED ORDER — SULFAMETHOXAZOLE-TRIMETHOPRIM 800-160 MG PO TABS: 1.0000 | ORAL_TABLET | Freq: Two times a day (BID) | ORAL | 0 refills | Status: DC

## 2023-07-08 NOTE — Progress Notes (Unsigned)
Patient came in today with complaint of pain when urinating and urinary urgency. Patient was currently taking Macrobid that was given to her while on a cruise ship. She is on day number 7 and feels as if it has not worked at all. Sample has been evaluated and sent for culture.

## 2023-07-08 NOTE — Telephone Encounter (Signed)
Pt called with complaints of UTI that started while she was on cruise last week. She was treated with macrobid and her symptoms are still present. She only has one pill left to take. Pt to come into office to provide urine sample that will be sent for culture.

## 2023-07-08 NOTE — Progress Notes (Signed)
Spoke w/ via phone for pre-op interview--- pt Lab needs dos----   [employer], ekg            Lab results------ no COVID test -----patient states asymptomatic no test needed Arrive at ------- 1215 on 07-16-2023 NPO after MN NO Solid Food.  Clear liquids from MN until--- 1115 Med rec completed Medications to take morning of surgery ----- coreg, synthroid, pepcid Diabetic medication ----- do not take metformin morning of surgery Patient instructed no nail polish to be worn day of surgery Patient instructed to bring photo id and insurance card day of surgery Patient aware to have Driver (ride ) / caregiver    for 24 hours after surgery -- husband, Valerie Brady Patient Special Instructions ----- n/a Pre-Op special Instructions ----- n/a Patient verbalized understanding of instructions that were given at this phone interview. Patient denies shortness of breath, chest pain, fever, cough at this phone interview.

## 2023-07-09 DIAGNOSIS — R3915 Urgency of urination: Secondary | ICD-10-CM | POA: Diagnosis not present

## 2023-07-09 DIAGNOSIS — R300 Dysuria: Secondary | ICD-10-CM | POA: Diagnosis not present

## 2023-07-10 ENCOUNTER — Encounter (HOSPITAL_BASED_OUTPATIENT_CLINIC_OR_DEPARTMENT_OTHER): Payer: Self-pay | Admitting: Obstetrics & Gynecology

## 2023-07-10 LAB — CERVICOVAGINAL ANCILLARY ONLY
Bacterial Vaginitis (gardnerella): NEGATIVE
Candida Glabrata: NEGATIVE
Candida Vaginitis: NEGATIVE
Comment: NEGATIVE
Comment: NEGATIVE
Comment: NEGATIVE

## 2023-07-11 ENCOUNTER — Telehealth (HOSPITAL_BASED_OUTPATIENT_CLINIC_OR_DEPARTMENT_OTHER): Payer: Self-pay | Admitting: *Deleted

## 2023-07-11 LAB — URINE CULTURE

## 2023-07-11 NOTE — Telephone Encounter (Signed)
DOB verified. Pt reports that she tested positive for covid on 8/26. She became symptomatic that evening. Her symptoms include cough, runny nose, fatigue, aches, and fever. Advised pt that her surgery scheduled for next week will need to be cancelled. Pt agreed. She also reports some lip swelling. She is unsure if it is related to covid or if it is an allergic reaction that she had to the Septra. She noticed the swelling after taking the second dose. Pt then stopped the medication. Advised that I make provider aware of symptoms and need to cancel surgery. Advised scheduler would be in contact with her for setting up another date.

## 2023-07-16 ENCOUNTER — Ambulatory Visit (HOSPITAL_BASED_OUTPATIENT_CLINIC_OR_DEPARTMENT_OTHER)
Admission: RE | Admit: 2023-07-16 | Payer: Medicare Other | Source: Home / Self Care | Admitting: Obstetrics & Gynecology

## 2023-07-16 DIAGNOSIS — Z01818 Encounter for other preprocedural examination: Secondary | ICD-10-CM

## 2023-07-16 HISTORY — DX: Atherosclerotic heart disease of native coronary artery without angina pectoris: I25.10

## 2023-07-16 HISTORY — DX: Gastro-esophageal reflux disease without esophagitis: K21.9

## 2023-07-16 HISTORY — DX: Sacrococcygeal disorders, not elsewhere classified: M53.3

## 2023-07-16 HISTORY — DX: Mixed hyperlipidemia: E78.2

## 2023-07-16 HISTORY — DX: Other chronic cystitis without hematuria: N30.20

## 2023-07-16 HISTORY — DX: Post covid-19 condition, unspecified: U09.9

## 2023-07-16 HISTORY — DX: Segmental and somatic dysfunction of abdomen and other regions: M99.09

## 2023-07-16 HISTORY — DX: Presence of spectacles and contact lenses: Z97.3

## 2023-07-16 HISTORY — DX: Myalgia, unspecified site: M79.10

## 2023-07-16 HISTORY — DX: Personal history of urinary (tract) infections: Z87.440

## 2023-07-16 HISTORY — DX: Other chronic pain: G89.29

## 2023-07-16 HISTORY — DX: Dry eye syndrome of bilateral lacrimal glands: H04.123

## 2023-07-16 HISTORY — DX: Autoimmune thyroiditis: E06.3

## 2023-07-16 HISTORY — DX: Type 2 diabetes mellitus without complications: E11.9

## 2023-07-16 HISTORY — DX: Personal history of gestational diabetes: Z86.32

## 2023-07-16 HISTORY — DX: Polyp of corpus uteri: N84.0

## 2023-07-16 HISTORY — DX: Postmenopausal bleeding: N95.0

## 2023-07-16 SURGERY — DILATATION & CURETTAGE/HYSTEROSCOPY WITH MYOSURE
Anesthesia: Choice

## 2023-07-18 ENCOUNTER — Encounter: Payer: Self-pay | Admitting: Internal Medicine

## 2023-07-19 ENCOUNTER — Other Ambulatory Visit: Payer: Self-pay | Admitting: Family

## 2023-07-19 MED ORDER — GABAPENTIN 300 MG PO CAPS: 300.0000 mg | ORAL_CAPSULE | Freq: Three times a day (TID) | ORAL | 1 refills | Status: DC | PRN

## 2023-07-21 ENCOUNTER — Other Ambulatory Visit: Payer: Self-pay | Admitting: Internal Medicine

## 2023-07-22 ENCOUNTER — Encounter (HOSPITAL_BASED_OUTPATIENT_CLINIC_OR_DEPARTMENT_OTHER): Payer: Self-pay | Admitting: Internal Medicine

## 2023-07-23 ENCOUNTER — Ambulatory Visit (INDEPENDENT_AMBULATORY_CARE_PROVIDER_SITE_OTHER): Payer: Medicare Other | Admitting: Internal Medicine

## 2023-07-23 ENCOUNTER — Encounter: Payer: Self-pay | Admitting: Internal Medicine

## 2023-07-23 VITALS — BP 148/78 | HR 70 | Temp 97.9°F | Ht 66.0 in | Wt 166.0 lb

## 2023-07-23 DIAGNOSIS — Z7984 Long term (current) use of oral hypoglycemic drugs: Secondary | ICD-10-CM | POA: Diagnosis not present

## 2023-07-23 DIAGNOSIS — E785 Hyperlipidemia, unspecified: Secondary | ICD-10-CM

## 2023-07-23 DIAGNOSIS — Z79899 Other long term (current) drug therapy: Secondary | ICD-10-CM

## 2023-07-23 DIAGNOSIS — I10 Essential (primary) hypertension: Secondary | ICD-10-CM | POA: Diagnosis not present

## 2023-07-23 DIAGNOSIS — E118 Type 2 diabetes mellitus with unspecified complications: Secondary | ICD-10-CM

## 2023-07-23 DIAGNOSIS — E039 Hypothyroidism, unspecified: Secondary | ICD-10-CM | POA: Diagnosis not present

## 2023-07-23 DIAGNOSIS — Z8616 Personal history of COVID-19: Secondary | ICD-10-CM

## 2023-07-23 LAB — POCT GLYCOSYLATED HEMOGLOBIN (HGB A1C): Hemoglobin A1C: 6.6 % — AB (ref 4.0–5.6)

## 2023-07-23 MED ORDER — CARVEDILOL 12.5 MG PO TABS
18.7500 mg | ORAL_TABLET | Freq: Two times a day (BID) | ORAL | 1 refills | Status: DC
Start: 2023-07-23 — End: 2023-09-05

## 2023-07-23 NOTE — Patient Instructions (Addendum)
Try increase carvedilol  to 1.5 of 12.5 bid  to control   for now .  Let us  know BP control   after increase.   Plan  tsh and urine microalbumin    when due about  6  weeks or so .   A1c is good today 6.6

## 2023-07-23 NOTE — Progress Notes (Signed)
Chief Complaint  Patient presents with   Medical Management of Chronic Issues    HPI: Valerie Brady 77 y.o. come in for Chronic disease management  meds   DM:: doing ok needs A1c checked  Thyroid: dose dec to 6 d per week and then changed to 100 mcg due for check in about 6 weeks  Hx covid   this summer  travel x 2    no complicated    sore throat  still has   post sx dec  exercise stamina leg sx  but getting around  Cbd at night   and better  in am  . To help sleep .etc   HT BP tolerating carvedilol not  at goal  but up today HLD on praluent due fo fu lab soon took a inj late  via lipid clinic   ROS: See pertinent positives and negatives per HPI.  Past Medical History:  Diagnosis Date   Carotidynia    per pt's pcp note effects right side   Coronary artery calcification seen on CAT scan    08-21-2021  in epic , calcium score= 74.3 involving mainly  LAD/ RCA   COVID-19 long hauler manifesting chronic muscle pain    Dry eye syndrome of both eyes    Endometrial polyp    GERD (gastroesophageal reflux disease)    Hashimoto's thyroiditis    followed by pcp   History of diverticulitis of colon 2016   History of gestational diabetes    History of recurrent UTIs    urologist---- dr Marlou Porch   History of syncope 02/2010   ED visit due to near syncope/ chest pain/ bradycardia,  found due to taking benicar,  per pcp note had negative cardiology work-up   Hyperlipidemia, mixed    cardiology--- dr Rennis Golden;   statin intolerence causes myalgias/ fatigue and zetia causes GI issues   Hypertension    PMB (postmenopausal bleeding)    SI (sacroiliac) joint dysfunction    Somatic dysfunction    followed by dr Herma Carson. Katrinka Blazing;  multiple regions--- entire spine and rib cage   Type 2 diabetes mellitus (HCC)    followed by pcp  (07-08-2023  per pt check blood sugar daily in am fasting , at times in afternoon,  fasting average 120)   Wears glasses     Family History  Problem Relation Age of Onset    Hypertension Mother    Dementia Mother    Heart disease Father    Dementia Father    Diabetes Father    Hypertension Brother     Social History   Socioeconomic History   Marital status: Married    Spouse name: Not on file   Number of children: Not on file   Years of education: Not on file   Highest education level: Professional school degree (e.g., MD, DDS, DVM, JD)  Occupational History   Occupation: retired physician    Comment: internist  Tobacco Use   Smoking status: Never   Smokeless tobacco: Never  Vaping Use   Vaping status: Never Used  Substance and Sexual Activity   Alcohol use: Not Currently    Comment: occ wine   Drug use: Never   Sexual activity: Not on file  Other Topics Concern   Not on file  Social History Narrative   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   Neg tobacco    Married   HH of 5   Pet cat   Mom  Dementia  Spouse memory decline    Social Determinants of Health   Financial Resource Strain: Low Risk  (12/17/2022)   Overall Financial Resource Strain (CARDIA)    Difficulty of Paying Living Expenses: Not hard at all  Food Insecurity: No Food Insecurity (12/17/2022)   Hunger Vital Sign    Worried About Running Out of Food in the Last Year: Never true    Ran Out of Food in the Last Year: Never true  Transportation Needs: No Transportation Needs (12/17/2022)   PRAPARE - Administrator, Civil Service (Medical): No    Lack of Transportation (Non-Medical): No  Physical Activity: Unknown (12/17/2022)   Exercise Vital Sign    Days of Exercise per Week: 0 days    Minutes of Exercise per Session: Not on file  Stress: No Stress Concern Present (12/17/2022)   [employer] of Occupational Health - Occupational Stress Questionnaire    Feeling of Stress : Only a little  Social Connections: Moderately Isolated (12/17/2022)   Social Connection and Isolation Panel [NHANES]    Frequency of Communication with Friends and Family: More than  three times a week    Frequency of Social Gatherings with Friends and Family: Twice a week    Attends Religious Services: Never    Database administrator or Organizations: No    Attends Engineer, structural: Not on file    Marital Status: Married    Outpatient Medications Prior to Visit  Medication Sig Dispense Refill   Cholecalciferol (VITAMIN D3) 50 MCG (2000 UT) capsule Take 2,000 Units by mouth daily.     Coenzyme Q10 (COQ-10) 200 MG CAPS Take 1 capsule by mouth daily.     Cranberry-Vitamin C-Vitamin E (CRANBERRY PLUS VITAMIN C) 4200-20-3 MG-MG-UNIT CAPS Take 1 capsule by mouth daily.     Cyanocobalamin (B-12) 5000 MCG CAPS Take 1 capsule by mouth once a week.     estradiol (ESTRACE) 0.1 MG/GM vaginal cream INSERT 1 gram VAGINALLY 3 TIMES WEEKLY as directed. (Patient taking differently: Place 1 Applicatorful vaginally 3 (three) times a week. INSERT 1 gram VAGINALLY 3 TIMES WEEKLY as directed.) 42.5 g 6   famotidine (PEPCID) 20 MG tablet Take 20 mg by mouth as needed for heartburn or indigestion.     gabapentin (NEURONTIN) 300 MG capsule Take 1 capsule (300 mg total) by mouth 3 (three) times daily as needed. 90 capsule 1   LANCETS MICRO THIN 33G MISC Use to test blood glucose once daily. 100 each 3   levothyroxine (SYNTHROID) 100 MCG tablet Take 1 tablet (100 mcg total) by mouth daily before breakfast. (Patient taking differently: Take 100 mcg by mouth daily before breakfast.) 90 tablet 2   Melatonin 5 MG TABS Take 5 mg by mouth at bedtime.     metFORMIN (GLUCOPHAGE) 1000 MG tablet Take 1 tablet (1,000 mg total) by mouth 2 (two) times daily. (Patient taking differently: Take 1,000 mg by mouth 2 (two) times daily.) 180 tablet 1   NON FORMULARY at bedtime. CBD 25mg  tablet     ondansetron (ZOFRAN) 4 MG tablet Take 1 tablet (4 mg total) by mouth every 8 (eight) hours as needed for nausea or vomiting. 20 tablet 1   PRALUENT 150 MG/ML SOAJ ADMINISTER 1 ML(150 MG) UNDER THE SKIN EVERY 14  DAYS 2 mL 5   Probiotic Product (ALIGN PO) Take 1 capsule by mouth daily.     TRUE METRIX BLOOD GLUCOSE TEST test strip CHECK BLOOD SUGAR ONCE A DAY. 50  each PRN   carvedilol (COREG) 12.5 MG tablet Take 1 tablet (12.5 mg total) by mouth 2 (two) times daily with a meal. (Patient taking differently: Take 12.5 mg by mouth 2 (two) times daily with a meal.) 180 tablet 3   sulfamethoxazole-trimethoprim (BACTRIM DS) 800-160 MG tablet Take 1 tablet by mouth 2 (two) times daily. 10 tablet 0   No facility-administered medications prior to visit.     EXAM:  BP (!) 148/78 (BP Location: Left Arm, Cuff Size: Normal)   Pulse 70   Temp 97.9 F (36.6 C) (Oral)   Ht 5\' 6"  (1.676 m)   Wt 166 lb (75.3 kg)   SpO2 96%   BMI 26.79 kg/m   Body mass index is 26.79 kg/m.  GENERAL: vitals reviewed and listed above, alert, oriented, appears well hydrated and in no acute distress HEENT: atraumatic, conjunctiva  clear, no obvious abnormalities on inspection of external nose and ears NECK: no obvious masses on inspection palpation  LUNGS: clear to auscultation bilaterally, no wheezes, rales or rhonchi, good air movement CV: HRRR, no clubbing cyanosis or  peripheral edema nl cap refill  MS: moves all extremities without noticeable focal  abnormality PSYCH: pleasant and cooperative, no obvious depression or anxiety Lab Results  Component Value Date   WBC 8.6 08/25/2021   HGB 13.2 08/25/2021   HCT 40.2 08/25/2021   PLT 392.0 08/25/2021   GLUCOSE 190 (H) 02/27/2023   CHOL 312 (H) 08/25/2021   TRIG 317.0 (H) 08/25/2021   HDL 49.30 08/25/2021   LDLDIRECT 197.0 08/25/2021   LDLCALC 174 (H) 02/27/2016   ALT 13 02/27/2023   AST 14 02/27/2023   NA 139 02/27/2023   K 3.8 02/27/2023   CL 102 02/27/2023   CREATININE 0.82 02/27/2023   BUN 18 02/27/2023   CO2 25 02/27/2023   TSH 0.25 (L) 02/27/2023   HGBA1C 6.6 (A) 07/23/2023   MICROALBUR <0.7 08/25/2021   BP Readings from Last 3 Encounters:  07/23/23 (!)  148/78  07/08/23 (!) 145/66  06/21/23 122/82    ASSESSMENT AND PLAN:  Discussed the following assessment and plan:  Controlled type 2 diabetes mellitus with complication, without long-term current use of insulin (HCC) - Plan: POC HgB A1c  Hypothyroidism, unspecified type - due for monitoring on newer dose  Medication management - Plan: carvedilol (COREG) 12.5 MG tablet  Hyperlipidemia, unspecified hyperlipidemia type - statin intolerant  on plaluent  Essential hypertension - inc to 1.5 of 12.5 bid for now and assess if not at goal can increase toe 25 bid as tolerated - Plan: carvedilol (COREG) 12.5 MG tablet  History of COVID-19  -Patient advised to return or notify health care team  if  new concerns arise.  Patient Instructions  Try increase carvedilol  to 1.5 of 12.5 bid  to control   for now .  Let us  know BP control   after increase.   Plan  tsh and urine microalbumin    when due about  6  weeks or so .   A1c is good today 6.6       Lawerence Dery K. Tyrina Hines M.D.

## 2023-07-25 ENCOUNTER — Encounter (HOSPITAL_BASED_OUTPATIENT_CLINIC_OR_DEPARTMENT_OTHER): Payer: Self-pay

## 2023-07-26 ENCOUNTER — Telehealth: Payer: Self-pay

## 2023-07-26 NOTE — Telephone Encounter (Signed)
Spoke to pt and inform her of provider's message. Verbalized understanding.

## 2023-07-26 NOTE — Telephone Encounter (Signed)
-----   Message from Berniece Andreas sent at 07/23/2023  6:11 PM EDT ----- You can tell her if bp is at goal  and lab ok she can come back in about 6 months

## 2023-08-03 DIAGNOSIS — R300 Dysuria: Secondary | ICD-10-CM | POA: Diagnosis not present

## 2023-08-03 DIAGNOSIS — N3001 Acute cystitis with hematuria: Secondary | ICD-10-CM | POA: Diagnosis not present

## 2023-08-08 ENCOUNTER — Telehealth: Payer: Self-pay

## 2023-08-08 ENCOUNTER — Encounter (HOSPITAL_BASED_OUTPATIENT_CLINIC_OR_DEPARTMENT_OTHER): Payer: Self-pay | Admitting: Obstetrics & Gynecology

## 2023-08-08 NOTE — Telephone Encounter (Signed)
Called patient to confirm the 08/12/23 surgery was canceled. Patient asked to be rescheduled w/ Dr. Hyacinth Meeker on 08/20/23 at 2pm @[facility]. Patient is aware she must arrive by 12 pm and was provided pre-op instructions.

## 2023-08-09 ENCOUNTER — Telehealth (HOSPITAL_BASED_OUTPATIENT_CLINIC_OR_DEPARTMENT_OTHER): Payer: Self-pay | Admitting: *Deleted

## 2023-08-09 NOTE — Telephone Encounter (Signed)
Called patient and left a message to call the office back to schedule the appointment for 4 week post op.

## 2023-08-12 ENCOUNTER — Ambulatory Visit (HOSPITAL_BASED_OUTPATIENT_CLINIC_OR_DEPARTMENT_OTHER): Admit: 2023-08-12 | Payer: Medicare Other | Admitting: Obstetrics & Gynecology

## 2023-08-12 ENCOUNTER — Encounter (HOSPITAL_BASED_OUTPATIENT_CLINIC_OR_DEPARTMENT_OTHER): Payer: Self-pay

## 2023-08-12 ENCOUNTER — Other Ambulatory Visit (HOSPITAL_BASED_OUTPATIENT_CLINIC_OR_DEPARTMENT_OTHER): Payer: Self-pay | Admitting: Obstetrics & Gynecology

## 2023-08-12 DIAGNOSIS — Z01818 Encounter for other preprocedural examination: Secondary | ICD-10-CM

## 2023-08-12 SURGERY — DILATATION & CURETTAGE/HYSTEROSCOPY WITH MYOSURE
Anesthesia: Choice

## 2023-08-13 ENCOUNTER — Encounter (HOSPITAL_BASED_OUTPATIENT_CLINIC_OR_DEPARTMENT_OTHER): Payer: Self-pay

## 2023-08-14 ENCOUNTER — Telehealth (HOSPITAL_BASED_OUTPATIENT_CLINIC_OR_DEPARTMENT_OTHER): Payer: Self-pay | Admitting: Obstetrics & Gynecology

## 2023-08-14 ENCOUNTER — Telehealth: Payer: Self-pay | Admitting: Internal Medicine

## 2023-08-14 DIAGNOSIS — M791 Myalgia, unspecified site: Secondary | ICD-10-CM

## 2023-08-14 DIAGNOSIS — E7841 Elevated Lipoprotein(a): Secondary | ICD-10-CM

## 2023-08-14 DIAGNOSIS — E7849 Other hyperlipidemia: Secondary | ICD-10-CM

## 2023-08-14 NOTE — Telephone Encounter (Signed)
  Patient wants to get her lipid labs done in November before her appt. Please place orders

## 2023-08-15 ENCOUNTER — Telehealth (HOSPITAL_BASED_OUTPATIENT_CLINIC_OR_DEPARTMENT_OTHER): Payer: Self-pay | Admitting: *Deleted

## 2023-08-15 NOTE — Telephone Encounter (Signed)
LMOVM for pt to call office to set up post op appt.

## 2023-08-15 NOTE — Telephone Encounter (Signed)
-----   Message from Donne Hazel sent at 08/09/2023  8:43 AM EDT ----- Regarding: ##Surgery 08/20/23## Rescheduled Done.  Posted for 08/20/23 @[facility] w/ Dr. Hyacinth Meeker at [phone]. [id number] polyp, PMP N950 Abnormal Pap N870 ----- Message ----- From: Donne Hazel Sent: 07/25/2023  10:08 AM EDT To: Harrie Jeans, RN; Jerene Bears, MD; # Subject: ##Surgery 08/12/23##                            Done.  Posted for 08/12/23 @[facility]  w/ Dr. Hyacinth Meeker at [phone] polyp, PMP N950 Abnormal Pap N870 ----- Message ----- From: Donne Hazel Sent: 07/11/2023   3:59 PM EDT To: Harrie Jeans, RN; Jerene Bears, MD; # Subject: CANCELED # Surgery 07/16/23                      Patient canceled due to Covid diagnosis. ----- Message ----- From: Donne Hazel Sent: 06/07/2023   9:02 AM EDT To: Harrie Jeans, RN; Jerene Bears, MD; # Subject: # Surgery 07/16/23                               Done.  Posted for 07/16/23 @[facility]  w/ Dr. Hyacinth Meeker at [phone]. [id number] polyp, PMP N950 Abnormal Pap N870 ----- Message ----- From: Jerene Bears, MD Sent: 05/13/2023   1:28 PM EDT To: Donne Hazel Subject: RE: surgery                                    The CPT code is correct.    Rosalita Chessman  ----- Message ----- From: Donne Hazel Sent: 05/10/2023   3:18 PM EDT To: Jerene Bears, MD Subject: RE: surgery                                    Hello,  Please check codes for accuracy. Thanks [id number] polyp, PMP N950 Abnormal Pap N870 ----- Message ----- From: Jerene Bears, MD Sent: 05/08/2023   7:10 AM EDT To: Donne Hazel Subject: surgery                                        Dejuana, This pt needs to be scheduled for hysteroscopy with possible polyp removal, dilation and curettage.  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  Dx:  PMP bleeding, abnormal endometrium on pap smear.  Assistant:   none.  Special requests:  myosure hysteroscope.  Thank you.  Lum Keas, MD

## 2023-08-15 NOTE — Telephone Encounter (Signed)
NMR lipoprofile ordered (previous was expired per epic)  Per chart review, lab reminder was sent on 06/12/23 to patient with info about labs   Sent her a new MyChart message about labs before Nov appt

## 2023-08-16 NOTE — Progress Notes (Signed)
PCP - Panosh, Neta Mends, MD Cardiologist - Rennis Golden Lisette Abu, MD   PPM/ICD - denies Device Orders - n/a Rep Notified - n/a  Chest x-ray - denies EKG - denies (DOS)  Stress Test - denies ECHO - denies Cardiac Cath - denies  CPAP - denies   Fasting Blood Sugar -  Per patient 110-150 Checks Blood Sugar /day Daily Per patient  Blood Thinner Instructions: denies Aspirin Instructions: n/a  ERAS Protcol - clear liquids until 1125  COVID TEST- n/a  Anesthesia review: Yes hx DM, HTN, had covid in August denies any symptoms at this time. (Does report having long covid)  Patient verbally denies any shortness of breath, fever, cough and chest pain during phone call   -------------  SDW INSTRUCTIONS given:  Your procedure is scheduled on August 20, 2023.  Report to [facility] Main Entrance "A" at 11:55 A.M., and check in at the Admitting office.  Call this number if you have problems the morning of surgery:  [phone]   Remember:  Do not eat after midnight the night before your surgery  You may drink clear liquids until 11:25 A.M the morning of your surgery.   Clear liquids allowed are: Water, Non-Citrus Juices (without pulp), Carbonated Beverages, Clear Tea, Black Coffee Only, and Gatorade    Take these medicines the morning of surgery with A SIP OF WATER  carvedilol (COREG)  famotidine (PEPCID)  levothyroxine (SYNTHROID)   IFNEEDED loratadine (CLARITIN)  ondansetron (ZOFRAN)   WHAT DO I DO ABOUT MY DIABETES MEDICATION?   Do not take oral diabetes medicines (pills) the morning of surgery.  THIS INCLUDES YOUR metFORMIN (GLUCOPHAGE).     The day of surgery, do not take other diabetes injectables, including Byetta (exenatide), Bydureon (exenatide ER), Victoza (liraglutide), or Trulicity (dulaglutide).  If your CBG is greater than 220 mg/dL, you may take  of your sliding scale (correction) dose of insulin.   HOW TO MANAGE YOUR DIABETES BEFORE AND AFTER  SURGERY  Why is it important to control my blood sugar before and after surgery? Improving blood sugar levels before and after surgery helps healing and can limit problems. A way of improving blood sugar control is eating a healthy diet by:  Eating less sugar and carbohydrates  Increasing activity/exercise  Talking with your doctor about reaching your blood sugar goals High blood sugars (greater than 180 mg/dL) can raise your risk of infections and slow your recovery, so you will need to focus on controlling your diabetes during the weeks before surgery. Make sure that the doctor who takes care of your diabetes knows about your planned surgery including the date and location.  How do I manage my blood sugar before surgery? Check your blood sugar at least 4 times a day, starting 2 days before surgery, to make sure that the level is not too high or low.  Check your blood sugar the morning of your surgery when you wake up and every 2 hours until you get to the Short Stay unit.  If your blood sugar is less than 70 mg/dL, you will need to treat for low blood sugar: Do not take insulin. Treat a low blood sugar (less than 70 mg/dL) with  cup of clear juice (cranberry or apple), 4 glucose tablets, OR glucose gel. Recheck blood sugar in 15 minutes after treatment (to make sure it is greater than 70 mg/dL). If your blood sugar is not greater than 70 mg/dL on recheck, call [phone] for further instructions. Report your  blood sugar to the short stay nurse when you get to Short Stay.  If you are admitted to the hospital after surgery: Your blood sugar will be checked by the staff and you will probably be given insulin after surgery (instead of oral diabetes medicines) to make sure you have good blood sugar levels. The goal for blood sugar control after surgery is 80-180 mg/dL.  As of today, STOP taking any Aspirin (unless otherwise instructed by your surgeon) Aleve, Naproxen, Ibuprofen, Motrin,  Advil, Goody's, BC's, all herbal medications, fish oil, and all vitamins.                      Do not wear jewelry, make up, or nail polish            Do not wear lotions, powders, perfumes/colognes, or deodorant.            Do not shave 48 hours prior to surgery.  Men may shave face and neck.            Do not bring valuables to the hospital.            [facility] is not responsible for any belongings or valuables.  Do NOT Smoke (Tobacco/Vaping) 24 hours prior to your procedure If you use a CPAP at night, you may bring all equipment for your overnight stay.   Contacts, glasses, dentures or bridgework may not be worn into surgery.      For patients admitted to the hospital, discharge time will be determined by your treatment team.   Patients discharged the day of surgery will not be allowed to drive home, and someone needs to stay with them for 24 hours.    Special instructions:   [facility]- Preparing For Surgery  Before surgery, you can play an important role. Because skin is not sterile, your skin needs to be as free of germs as possible. You can reduce the number of germs on your skin by washing with CHG (chlorahexidine gluconate) Soap before surgery.  CHG is an antiseptic cleaner which kills germs and bonds with the skin to continue killing germs even after washing.    Oral Hygiene is also important to reduce your risk of infection.  Remember - BRUSH YOUR TEETH THE MORNING OF SURGERY WITH YOUR REGULAR TOOTHPASTE  Please do not use if you have an allergy to CHG or antibacterial soaps. If your skin becomes reddened/irritated stop using the CHG.  Do not shave (including legs and underarms) for at least 48 hours prior to first CHG shower. It is OK to shave your face.  Please follow these instructions carefully.   Shower the NIGHT BEFORE SURGERY and the MORNING OF SURGERY with DIAL Soap.   Pat yourself dry with a CLEAN TOWEL.  Wear CLEAN PAJAMAS to bed the night before  surgery  Place CLEAN SHEETS on your bed the night of your first shower and DO NOT SLEEP WITH PETS.   Day of Surgery: Please shower morning of surgery  Wear Clean/Comfortable clothing the morning of surgery Do not apply any deodorants/lotions.   Remember to brush your teeth WITH YOUR REGULAR TOOTHPASTE.   Questions were answered. Patient verbalized understanding of instructions.

## 2023-08-19 ENCOUNTER — Other Ambulatory Visit (HOSPITAL_BASED_OUTPATIENT_CLINIC_OR_DEPARTMENT_OTHER): Payer: Self-pay | Admitting: Obstetrics & Gynecology

## 2023-08-19 ENCOUNTER — Encounter (HOSPITAL_COMMUNITY): Payer: Self-pay | Admitting: Obstetrics & Gynecology

## 2023-08-19 ENCOUNTER — Other Ambulatory Visit: Payer: Self-pay

## 2023-08-19 NOTE — Anesthesia Preprocedure Evaluation (Signed)
Anesthesia Evaluation  Patient identified by MRN, date of birth, ID band Patient awake    Reviewed: Allergy & Precautions, H&P , NPO status , Patient's Chart, lab work & pertinent test results  Airway Mallampati: I  TM Distance: >3 FB Neck ROM: Full    Dental  (+) Teeth Intact, Dental Advisory Given   Pulmonary neg pulmonary ROS   Pulmonary exam normal breath sounds clear to auscultation       Cardiovascular hypertension (156/91 preop), Normal cardiovascular exam Rhythm:Regular Rate:Normal     Neuro/Psych negative neurological ROS  negative psych ROS   GI/Hepatic Neg liver ROS,GERD  Controlled and Medicated,,  Endo/Other  diabetes, Well Controlled, Type 2, Oral Hypoglycemic AgentsHypothyroidism    Renal/GU negative Renal ROS  negative genitourinary   Musculoskeletal negative musculoskeletal ROS (+)    Abdominal   Peds negative pediatric ROS (+)  Hematology negative hematology ROS (+)   Anesthesia Other Findings   Reproductive/Obstetrics negative OB ROS                             Anesthesia Physical Anesthesia Plan  ASA: 2  Anesthesia Plan: General   Post-op Pain Management: Tylenol PO (pre-op)* and Toradol IV (intra-op)*   Induction: Intravenous  PONV Risk Score and Plan: 3 and Ondansetron, Dexamethasone, Midazolam and Treatment may vary due to age or medical condition  Airway Management Planned: LMA  Additional Equipment: None  Intra-op Plan:   Post-operative Plan: Extubation in OR  Informed Consent: I have reviewed the patients History and Physical, chart, labs and discussed the procedure including the risks, benefits and alternatives for the proposed anesthesia with the patient or authorized representative who has indicated his/her understanding and acceptance.     Dental advisory given  Plan Discussed with: CRNA  Anesthesia Plan Comments: (  )        Anesthesia Quick Evaluation

## 2023-08-19 NOTE — Progress Notes (Signed)
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   History includes never smoker, HTN, HLD (intolerant to statins), DM2, Hashimoto's thyroiditis/hypothyroidism, GERD, near syncope (2011), long COVID, postmenopausal bleeding. Treated for UTI with Keflex on 08/03/23.   She is followed by Dr. Rennis Golden [facility] for HLD, intolerant to statin therapy due to fatigue and myalgias.Ezetimibe caused stomach cramps. She had a Coronary Calcium Score on 10//22 that showed a CAC score of 74.3 (60th percentile). She has been able to tolerate Praluent. Last visit 07/2022 with yearly follow-up scheduled with APP on 09/18/23.   Anesthesia team to evaluate on the day of surgery. Labs and EKG on arrival as indicated. She is a retired Administrator, arts.    VS: Ht 5\' 5"  (1.651 m)   Wt 75.3 kg   BMI 27.62 kg/m  BP Readings from Last 3 Encounters:  07/23/23 (!) 148/78  07/08/23 (!) 145/66  06/21/23 122/82   Pulse Readings from Last 3 Encounters:  07/23/23 70  07/08/23 74  06/21/23 73     PROVIDERS: Panosh, Neta Mends, MD is PCP  Chrystie Nose, MD is cardiologist at [facility], established 01/26/22.   LABS: Most recent lab results in [facility] include: Lab Results  Component Value Date   GLUCOSE 190 (H) 02/27/2023   ALT 13 02/27/2023   AST 14 02/27/2023   NA 139 02/27/2023   K 3.8 02/27/2023   CL 102 02/27/2023   CREATININE 0.82 02/27/2023   BUN 18 02/27/2023   CO2 25 02/27/2023   TSH 0.25 (L) 02/27/2023   HGBA1C 6.6 (A) 07/23/2023    IMAGES: Transvaginal US 05/01/23: FINDINGS: Uterus: 6.4 x 4.7 x 3.4cm.  Anteverted and atrophic in  apperance.  Volume: 52.69ml Endometrial thickness:  1.44mm.  no increased blood flow.   Right ovary:  0.8 x 0.8 x 0.9cm.  Atrophic.  Volume:  0.3 ml Left ovary:  not clearly seen.   Other findings:  No abnormal free fluid.  Cervix WNL.   EKG: For day of surgery as indicated.    CV: CT Cardiac Scoring 08/21/21: FINDINGS: Coronary arteries: Normal origins. Coronary Calcium Score: Left main: 0 Left anterior descending artery: 54.9 Left circumflex artery: 2.24 Right coronary artery: 17.1 Total: 74.3 Percentile: 60th Pericardium: Normal.   - Ascending Aorta: Normal caliber. Scattered calcifications in the ascending and descending aorta. - Mitral Annular Calcifications are present.  IMPRESSION: Coronary calcium score of 74.3. This was 60th percentile for age-, race-, and sex-matched controls.   RECOMMENDATIONS: Coronary artery calcium (CAC) score is a strong predictor of incident coronary heart disease (CHD) and provides predictive information beyond traditional risk factors. CAC scoring is reasonable to use in the decision to withhold, postpone, or initiate statin therapy in intermediate-risk or selected borderline-risk asymptomatic adults (age 87-75 years and LDL-C >=70 to <190 mg/dL) who do not have diabetes or established atherosclerotic cardiovascular disease (ASCVD).* In intermediate-risk (10-year ASCVD risk >=7.5% to <20%) adults or selected borderline-risk (10-year ASCVD risk >=5% to <7.5%) adults in whom a CAC score is measured for the purpose of  making a treatment decision the following recommendations have been made:   If CAC=0, it is reasonable to withhold statin therapy and reassess in 5 to 10 years, as long as higher risk conditions are absent (diabetes mellitus, family history of premature CHD in first degree relatives (males <55 years; females <65 years), cigarette smoking, or LDL >=190 mg/dL).   If CAC is 1 to 99, it is reasonable to initiate statin  therapy for patients >=75 years of age...    Past Medical History:  Diagnosis Date   Carotidynia    per pt's pcp note effects right side   COVID-19 long hauler manifesting chronic muscle pain    Dry eye syndrome of both eyes    Endometrial polyp    GERD (gastroesophageal reflux disease)    Hashimoto's thyroiditis    followed by pcp   History of diverticulitis of colon 2016   History of gestational diabetes    History of recurrent UTIs    urologist---- dr Marlou Porch   History of syncope 02/2010   ED visit due to near syncope/ chest pain/ bradycardia,  found due to taking benicar,  per pcp note had negative cardiology work-up   Hyperlipidemia, mixed    cardiology--- dr Rennis Golden;   statin intolerence causes myalgias/ fatigue and zetia causes GI issues   Hypertension    Hypothyroidism    Long COVID    PMB (postmenopausal bleeding)    SI (sacroiliac) joint dysfunction    Somatic dysfunction    followed by dr Herma Carson. Katrinka Blazing;  multiple regions--- entire spine and rib cage   Type 2 diabetes mellitus (HCC)    followed by pcp  (07-08-2023  per pt check blood sugar daily in am fasting , at times in afternoon,  fasting average 120)   Wears glasses     Past Surgical History:  Procedure Laterality Date   CESAREAN SECTION  1987   COLONOSCOPY WITH PROPOFOL  05/10/2022   dr buccini   ORIF ANKLE FRACTURE Left 10/20/2001   @[facility]  by dr Thurston Hole;   left distal fibula   TONSILLECTOMY  1953   TUBAL LIGATION Bilateral 1989    MEDICATIONS: No current facility-administered medications for this encounter.    carvedilol (COREG) 12.5 MG tablet   Cholecalciferol (VITAMIN D3) 50 MCG (2000 UT) capsule   Coenzyme Q10 (COQ-10) 200 MG CAPS   Cranberry-Vitamin C-Vitamin E (CRANBERRY PLUS VITAMIN C) 4200-20-3 MG-MG-UNIT CAPS   Cyanocobalamin (B-12) 5000 MCG CAPS   estradiol (ESTRACE) 0.1 MG/GM vaginal cream   famotidine (PEPCID) 20 MG tablet   gabapentin (NEURONTIN) 300 MG capsule   LANCETS MICRO THIN 33G MISC    levothyroxine (SYNTHROID) 100 MCG tablet   loratadine (CLARITIN) 10 MG tablet   Melatonin 5 MG TABS   metFORMIN (GLUCOPHAGE) 1000 MG tablet   NON FORMULARY   ondansetron (ZOFRAN) 4 MG tablet   PRALUENT 150 MG/ML SOAJ   TRUE METRIX BLOOD GLUCOSE TEST test strip    Shonna Chock, PA-C Surgical Short Stay/Anesthesiology [facility] Phone [phone] [facility] Phone [phone] 08/19/2023 1:24 PM

## 2023-08-20 ENCOUNTER — Ambulatory Visit (HOSPITAL_COMMUNITY): Payer: Self-pay | Admitting: Vascular Surgery

## 2023-08-20 ENCOUNTER — Other Ambulatory Visit (HOSPITAL_COMMUNITY): Payer: Self-pay

## 2023-08-20 ENCOUNTER — Other Ambulatory Visit: Payer: Self-pay

## 2023-08-20 ENCOUNTER — Encounter (HOSPITAL_COMMUNITY): Payer: Self-pay | Admitting: Obstetrics & Gynecology

## 2023-08-20 ENCOUNTER — Ambulatory Visit (HOSPITAL_COMMUNITY)
Admission: RE | Admit: 2023-08-20 | Discharge: 2023-08-20 | Disposition: A | Discharge status: Home / Self Care | Payer: Medicare Other | Attending: Obstetrics & Gynecology | Admitting: Obstetrics & Gynecology

## 2023-08-20 ENCOUNTER — Ambulatory Visit (HOSPITAL_BASED_OUTPATIENT_CLINIC_OR_DEPARTMENT_OTHER): Payer: Self-pay | Admitting: Vascular Surgery

## 2023-08-20 ENCOUNTER — Other Ambulatory Visit (HOSPITAL_BASED_OUTPATIENT_CLINIC_OR_DEPARTMENT_OTHER): Payer: Self-pay | Admitting: Obstetrics & Gynecology

## 2023-08-20 ENCOUNTER — Encounter (HOSPITAL_COMMUNITY)
Admission: RE | Disposition: A | Discharge status: Home / Self Care | Payer: Self-pay | Source: Home / Self Care | Attending: Obstetrics & Gynecology

## 2023-08-20 DIAGNOSIS — N840 Polyp of corpus uteri: Secondary | ICD-10-CM

## 2023-08-20 DIAGNOSIS — I10 Essential (primary) hypertension: Secondary | ICD-10-CM | POA: Diagnosis not present

## 2023-08-20 DIAGNOSIS — E119 Type 2 diabetes mellitus without complications: Secondary | ICD-10-CM | POA: Insufficient documentation

## 2023-08-20 DIAGNOSIS — Z01818 Encounter for other preprocedural examination: Secondary | ICD-10-CM

## 2023-08-20 DIAGNOSIS — E782 Mixed hyperlipidemia: Secondary | ICD-10-CM | POA: Diagnosis not present

## 2023-08-20 DIAGNOSIS — Z87440 Personal history of urinary (tract) infections: Secondary | ICD-10-CM | POA: Diagnosis not present

## 2023-08-20 DIAGNOSIS — K219 Gastro-esophageal reflux disease without esophagitis: Secondary | ICD-10-CM | POA: Insufficient documentation

## 2023-08-20 DIAGNOSIS — N8500 Endometrial hyperplasia, unspecified: Secondary | ICD-10-CM | POA: Diagnosis not present

## 2023-08-20 DIAGNOSIS — Z7984 Long term (current) use of oral hypoglycemic drugs: Secondary | ICD-10-CM | POA: Insufficient documentation

## 2023-08-20 DIAGNOSIS — E063 Autoimmune thyroiditis: Secondary | ICD-10-CM | POA: Diagnosis not present

## 2023-08-20 DIAGNOSIS — N8501 Benign endometrial hyperplasia: Secondary | ICD-10-CM

## 2023-08-20 DIAGNOSIS — R935 Abnormal findings on diagnostic imaging of other abdominal regions, including retroperitoneum: Secondary | ICD-10-CM

## 2023-08-20 DIAGNOSIS — Z79890 Hormone replacement therapy (postmenopausal): Secondary | ICD-10-CM | POA: Diagnosis not present

## 2023-08-20 DIAGNOSIS — N950 Postmenopausal bleeding: Secondary | ICD-10-CM | POA: Insufficient documentation

## 2023-08-20 HISTORY — PX: DILATATION & CURETTAGE/HYSTEROSCOPY WITH MYOSURE: SHX6511

## 2023-08-20 HISTORY — DX: Post covid-19 condition, unspecified: U09.9

## 2023-08-20 LAB — BASIC METABOLIC PANEL
Anion gap: 11 (ref 5–15)
BUN: 11 mg/dL (ref 8–23)
CO2: 22 mmol/L (ref 22–32)
Calcium: 9 mg/dL (ref 8.9–10.3)
Chloride: 103 mmol/L (ref 98–111)
Creatinine, Ser: 0.79 mg/dL (ref 0.44–1.00)
GFR, Estimated: >60 mL/min (ref >60)
Glucose, Bld: 131 mg/dL — ABNORMAL HIGH (ref 70–99)
Potassium: 3.9 mmol/L (ref 3.5–5.1)
Sodium: 136 mmol/L (ref 135–145)

## 2023-08-20 LAB — GLUCOSE, CAPILLARY
Glucose-Capillary: 122 mg/dL — ABNORMAL HIGH (ref 70–99)
Glucose-Capillary: 135 mg/dL — ABNORMAL HIGH (ref 70–99)

## 2023-08-20 LAB — CBC
HCT: 39.7 % (ref 36.0–46.0)
Hemoglobin: 12.8 g/dL (ref 12.0–15.0)
MCH: 29.6 pg (ref 26.0–34.0)
MCHC: 32.2 g/dL (ref 30.0–36.0)
MCV: 91.9 fL (ref 80.0–100.0)
Platelets: 389 10*3/uL (ref 150–400)
RBC: 4.32 MIL/uL (ref 3.87–5.11)
RDW: 14.2 % (ref 11.5–15.5)
WBC: 11.2 10*3/uL — ABNORMAL HIGH (ref 4.0–10.5)
nRBC: 0 % (ref 0.0–0.2)

## 2023-08-20 SURGERY — DILATATION & CURETTAGE/HYSTEROSCOPY WITH MYOSURE
Anesthesia: General | Site: Vagina

## 2023-08-20 MED ORDER — FENTANYL CITRATE (PF) 250 MCG/5ML IJ SOLN
INTRAMUSCULAR | Status: DC | PRN
  Administered 2023-08-20 (×2): 25 ug via INTRAVENOUS

## 2023-08-20 MED ORDER — PROPOFOL 10 MG/ML IV BOLUS
INTRAVENOUS | Status: AC
  Filled 2023-08-20: qty 20

## 2023-08-20 MED ORDER — EPHEDRINE SULFATE (PRESSORS) 50 MG/ML IJ SOLN
INTRAMUSCULAR | Status: DC | PRN
Start: 2023-08-20 — End: 2023-08-20
  Administered 2023-08-20 (×3): 5 mg via INTRAVENOUS

## 2023-08-20 MED ORDER — INSULIN ASPART 100 UNIT/ML IJ SOLN: 0.0000 [IU] | INTRAMUSCULAR | Status: DC | PRN

## 2023-08-20 MED ORDER — CHLORHEXIDINE GLUCONATE 0.12 % MT SOLN
OROMUCOSAL | Status: AC
  Administered 2023-08-20: 15 mL via OROMUCOSAL
  Filled 2023-08-20: qty 15

## 2023-08-20 MED ORDER — ORAL CARE MOUTH RINSE: 15.0000 mL | Freq: Once | OROMUCOSAL | Status: AC

## 2023-08-20 MED ORDER — PROPOFOL 10 MG/ML IV BOLUS
INTRAVENOUS | Status: DC | PRN
  Administered 2023-08-20: 150 mg via INTRAVENOUS

## 2023-08-20 MED ORDER — ONDANSETRON HCL 4 MG/2ML IJ SOLN
INTRAMUSCULAR | Status: DC | PRN
  Administered 2023-08-20: 4 mg via INTRAVENOUS

## 2023-08-20 MED ORDER — SODIUM CHLORIDE 0.9 % IR SOLN
Status: DC | PRN
  Administered 2023-08-20: 3000 mL

## 2023-08-20 MED ORDER — CHLORHEXIDINE GLUCONATE 0.12 % MT SOLN: 15.0000 mL | Freq: Once | OROMUCOSAL | Status: AC

## 2023-08-20 MED ORDER — FENTANYL CITRATE (PF) 250 MCG/5ML IJ SOLN
INTRAMUSCULAR | Status: AC
  Filled 2023-08-20: qty 5

## 2023-08-20 MED ORDER — IBUPROFEN 800 MG PO TABS
800.0000 mg | ORAL_TABLET | Freq: Three times a day (TID) | ORAL | 0 refills | Status: DC | PRN
  Filled 2023-08-20: qty 20, 7d supply, fill #0

## 2023-08-20 MED ORDER — LIDOCAINE 2% (20 MG/ML) 5 ML SYRINGE
INTRAMUSCULAR | Status: DC | PRN
  Administered 2023-08-20: 60 mg via INTRAVENOUS

## 2023-08-20 MED ORDER — HYDROCODONE-ACETAMINOPHEN 5-325 MG PO TABS
1.0000 | ORAL_TABLET | Freq: Four times a day (QID) | ORAL | 0 refills | Status: DC | PRN
Start: 2023-08-20 — End: 2023-09-11
  Filled 2023-08-20: qty 10, 2d supply, fill #0

## 2023-08-20 MED ORDER — PHENYLEPHRINE HCL (PRESSORS) 10 MG/ML IV SOLN
INTRAVENOUS | Status: DC | PRN
Start: 2023-08-20 — End: 2023-08-20
  Administered 2023-08-20 (×3): 80 ug via INTRAVENOUS

## 2023-08-20 MED ORDER — NITROFURANTOIN MONOHYD MACRO 100 MG PO CAPS: 100.0000 mg | ORAL_CAPSULE | Freq: Two times a day (BID) | ORAL | 0 refills | Status: DC

## 2023-08-20 MED ORDER — LIDOCAINE-EPINEPHRINE 1 %-1:100000 IJ SOLN
INTRAMUSCULAR | Status: DC | PRN
  Administered 2023-08-20: 10 mL

## 2023-08-20 MED ORDER — 0.9 % SODIUM CHLORIDE (POUR BTL) OPTIME
TOPICAL | Status: DC | PRN
Start: 2023-08-20 — End: 2023-08-20
  Administered 2023-08-20: 1000 mL

## 2023-08-20 MED ORDER — POVIDONE-IODINE 10 % EX SWAB
2.0000 | Freq: Once | CUTANEOUS | Status: AC
  Administered 2023-08-20: 2 via TOPICAL

## 2023-08-20 MED ORDER — KETOROLAC TROMETHAMINE 30 MG/ML IJ SOLN
INTRAMUSCULAR | Status: DC | PRN
Start: 2023-08-20 — End: 2023-08-20
  Administered 2023-08-20: 15 mg via INTRAVENOUS

## 2023-08-20 MED ORDER — DEXAMETHASONE SODIUM PHOSPHATE 10 MG/ML IJ SOLN
INTRAMUSCULAR | Status: DC | PRN
  Administered 2023-08-20: 5 mg via INTRAVENOUS

## 2023-08-20 MED ORDER — LACTATED RINGERS IV SOLN: INTRAVENOUS | Status: DC

## 2023-08-20 SURGICAL SUPPLY — 17 items
CATH FOLEY LATEX FREE 14FR (CATHETERS) ×1
CATH FOLEY LF 14FR (CATHETERS) IMPLANT
DEVICE MYOSURE LITE (MISCELLANEOUS) IMPLANT
DEVICE MYOSURE REACH (MISCELLANEOUS) IMPLANT
DILATOR CANAL MILEX (MISCELLANEOUS) IMPLANT
GLOVE BIOGEL PI IND STRL 7.0 (GLOVE) ×2 IMPLANT
GOWN STRL REUS W/TWL LRG LVL3 (GOWN DISPOSABLE) ×2 IMPLANT
IV NS IRRIG 3000ML ARTHROMATIC (IV SOLUTION) ×2 IMPLANT
KIT PROCEDURE FLUENT (KITS) ×2 IMPLANT
KIT TURNOVER CYSTO (KITS) ×2 IMPLANT
PACK VAGINAL MINOR WOMEN LF (CUSTOM PROCEDURE TRAY) ×2 IMPLANT
PAD OB MATERNITY 4.3X12.25 (PERSONAL CARE ITEMS) ×2 IMPLANT
SEAL CERVICAL OMNI LOK (ABLATOR) IMPLANT
SEAL ROD LENS SCOPE MYOSURE (ABLATOR) ×2 IMPLANT
SLEEVE SCD COMPRESS KNEE MED (STOCKING) ×2 IMPLANT
TOWEL OR 17X24 6PK STRL BLUE (TOWEL DISPOSABLE) ×2 IMPLANT
WATER STERILE IRR 500ML POUR (IV SOLUTION) ×2 IMPLANT

## 2023-08-20 NOTE — Anesthesia Procedure Notes (Signed)
Procedure Name: LMA Insertion Date/Time: 08/20/2023 3:11 PM  Performed by: Camillia Herter, CRNAPre-anesthesia Checklist: Patient identified, Emergency Drugs available, Suction available and Patient being monitored Patient Re-evaluated:Patient Re-evaluated prior to induction Oxygen Delivery Method: Circle System Utilized Preoxygenation: Pre-oxygenation with 100% oxygen Induction Type: IV induction Ventilation: Mask ventilation without difficulty LMA: LMA inserted LMA Size: 4.0 Number of attempts: 1 Airway Equipment and Method: Bite block Placement Confirmation: positive ETCO2 Tube secured with: Tape Dental Injury: Teeth and Oropharynx as per pre-operative assessment

## 2023-08-20 NOTE — Transfer of Care (Signed)
Immediate Anesthesia Transfer of Care Note  Patient: Valerie Brady  Procedure(s) Performed: DILATATION & CURETTAGE/HYSTEROSCOPY WITH MYOSURE WITH POLYP RESECTION (Vagina )  Patient Location: PACU  Anesthesia Type:General  Level of Consciousness: awake, alert , and oriented  Airway & Oxygen Therapy: Patient Spontanous Breathing and Patient connected to face mask  Post-op Assessment: Report given to RN and Post -op Vital signs reviewed and stable  Post vital signs: Reviewed and stable  Last Vitals:  Vitals Value Taken Time  BP 161/71 08/20/23 1608  Temp 36.3 C 08/20/23 1608  Pulse 64 08/20/23 1612  Resp 15 08/20/23 1612  SpO2 98 % 08/20/23 1612  Vitals shown include unfiled device data.  Last Pain:  Vitals:   08/20/23 1248  TempSrc:   PainSc: 0-No pain         Complications: No notable events documented.

## 2023-08-20 NOTE — H&P (Signed)
Valerie Brady is an 77 y.o. female G37P2A1 WF with PMP bleeding here for hysteroscopy, D&C.  Pt had bleeding in early June.  Pap smear was normal.  Endometrial biopsy showed atrophic endometrial glands.  Ultrasound then showed uterus measuring 6.4 x 4.7 x 3.4cm with 1.44mm endometrium.  Given endometrial thickening, additional evaluation with hysteroscopy, D&C was recommended.  Pt aware if endometrial polyp is present then this will be removed as well.  This procedure has been scheduled and rescheduled twice due to Covid diagnosis and then inconvenient scheduling for this pt.  She is here today for this.  Procedure reviewed.  Risks and benefits reviewed as well.  There is no alternative to this step in her evaluation except to do nothing else and I do not feel comfortable with that decision.  Pt is in agreement as well.  Pertinent Gynecological History: Menses: post-menopausal Bleeding: post menopausal bleeding Contraception: post menopausal status DES exposure: denies Blood transfusions:  none Sexually transmitted diseases: no past history Previous GYN Procedures:  c section and tubal ligation   Last mammogram: normal Date: 01/16/2023 Last pap: normal Date: 05/01/2023 OB History: G3, P2   Menstrual History: No LMP recorded. Patient is postmenopausal.    Past Medical History:  Diagnosis Date   Carotidynia    per pt's pcp note effects right side   COVID-19 long hauler manifesting chronic muscle pain    Dry eye syndrome of both eyes    Endometrial polyp    GERD (gastroesophageal reflux disease)    Hashimoto's thyroiditis    followed by pcp   History of diverticulitis of colon 2016   History of gestational diabetes    History of recurrent UTIs    urologist---- dr Marlou Porch   History of syncope 02/2010   ED visit due to near syncope/ chest pain/ bradycardia,  found due to taking benicar,  per pcp note had negative cardiology work-up   Hyperlipidemia, mixed    cardiology--- dr Rennis Golden;    statin intolerence causes myalgias/ fatigue and zetia causes GI issues   Hypertension    Hypothyroidism    Long COVID    PMB (postmenopausal bleeding)    SI (sacroiliac) joint dysfunction    Somatic dysfunction    followed by dr Herma Carson. Katrinka Blazing;  multiple regions--- entire spine and rib cage   Type 2 diabetes mellitus (HCC)    followed by pcp  (07-08-2023  per pt check blood sugar daily in am fasting , at times in afternoon,  fasting average 120)   Wears glasses     Past Surgical History:  Procedure Laterality Date   CESAREAN SECTION  1987   COLONOSCOPY WITH PROPOFOL  05/10/2022   dr buccini   ORIF ANKLE FRACTURE Left 10/20/2001   @[facility]  by dr Thurston Hole;   left distal fibula   TONSILLECTOMY  1953   TUBAL LIGATION Bilateral 1989    Family History  Problem Relation Age of Onset   Hypertension Mother    Dementia Mother    Heart disease Father    Dementia Father    Diabetes Father    Hypertension Brother     Social History:  reports that she has never smoked. She has never used smokeless tobacco. She reports that she does not currently use alcohol. She reports that she does not use drugs.  Allergies:  Allergies  Allergen Reactions   Bee Venom Swelling   Egg-Derived Products Nausea And Vomiting   Enalapril Maleate Cough   Lactose Intolerance (Gi) Diarrhea  Olmesartan Other (See Comments)     Gas and chest pain/bradycardia    Sulfamethoxazole-Trimethoprim Other (See Comments)    Caused Swollen lips with anti-biotic   Almond (Diagnostic) Other (See Comments)    Gi upset   Amlodipine Other (See Comments)    Fatigue lethargy at 5 mg dose   Latex Rash   Zetia [Ezetimibe] Other (See Comments)    GI cramps    Medications Prior to Admission  Medication Sig Dispense Refill Last Dose   carvedilol (COREG) 12.5 MG tablet Take 1.5 tablets (18.75 mg total) by mouth 2 (two) times daily with a meal. 270 tablet 1 08/20/2023 at 1000   Cholecalciferol (VITAMIN D3) 50 MCG (2000 UT) capsule Take  2,000 Units by mouth in the morning.   08/19/2023   Coenzyme Q10 (COQ-10) 200 MG CAPS Take 200 mg by mouth in the morning.   08/19/2023   Cranberry-Vitamin C-Vitamin E (CRANBERRY PLUS VITAMIN C) 4200-20-3 MG-MG-UNIT CAPS Take 2 capsules by mouth in the morning and at bedtime.   08/20/2023 at 1000   Cyanocobalamin (B-12) 5000 MCG CAPS Take 1 capsule by mouth once a week.   Past Week   estradiol (ESTRACE) 0.1 MG/GM vaginal cream INSERT 1 gram VAGINALLY 3 TIMES WEEKLY as directed. 42.5 g 6 Past Week   famotidine (PEPCID) 20 MG tablet Take 20 mg by mouth as needed for heartburn or indigestion.   08/20/2023 at 1000   gabapentin (NEURONTIN) 300 MG capsule Take 1 capsule (300 mg total) by mouth 3 (three) times daily as needed. (Patient taking differently: Take 300 mg by mouth at bedtime.) 90 capsule 1 08/19/2023   LANCETS MICRO THIN 33G MISC Use to test blood glucose once daily. 100 each 3    levothyroxine (SYNTHROID) 100 MCG tablet Take 1 tablet (100 mcg total) by mouth daily before breakfast. 90 tablet 2 08/20/2023 at 1000   Melatonin 5 MG TABS Take 5 mg by mouth at bedtime.   08/19/2023   metFORMIN (GLUCOPHAGE) 1000 MG tablet Take 1 tablet (1,000 mg total) by mouth 2 (two) times daily. 180 tablet 1 08/19/2023   NON FORMULARY Take 25 mg by mouth at bedtime as needed (pain/sleep). CBD 25mg  tablet   08/19/2023   ondansetron (ZOFRAN) 4 MG tablet Take 1 tablet (4 mg total) by mouth every 8 (eight) hours as needed for nausea or vomiting. 20 tablet 1 Past Week   PRALUENT 150 MG/ML SOAJ ADMINISTER 1 ML(150 MG) UNDER THE SKIN EVERY 14 DAYS 2 mL 5 Past Week   loratadine (CLARITIN) 10 MG tablet Take 10 mg by mouth daily as needed for allergies.   Unknown   TRUE METRIX BLOOD GLUCOSE TEST test strip CHECK BLOOD SUGAR ONCE A DAY. 50 each PRN     Review of Systems  Constitutional: Negative.   Genitourinary: Negative.     Blood pressure (!) 156/91, pulse 67, temperature 97.8 F (36.6 C), temperature source Oral, resp.  rate 18, height 5\' 5"  (1.651 m), weight 74.8 kg, SpO2 95%. Physical Exam Constitutional:      Appearance: Normal appearance.  Cardiovascular:     Rate and Rhythm: Normal rate and regular rhythm.  Pulmonary:     Breath sounds: Normal breath sounds.  Neurological:     General: No focal deficit present.     Mental Status: She is alert.  Psychiatric:        Mood and Affect: Mood normal.        Behavior: Behavior normal.  Results for orders placed or performed during the hospital encounter of 08/20/23 (from the past 24 hour(s))  CBC     Status: Abnormal   Collection Time: 08/20/23 12:50 PM  Result Value Ref Range   WBC 11.2 (H) 4.0 - 10.5 K/uL   RBC 4.32 3.87 - 5.11 MIL/uL   Hemoglobin 12.8 12.0 - 15.0 g/dL   HCT 95.2 84.1 - 32.4 %   MCV 91.9 80.0 - 100.0 fL   MCH 29.6 26.0 - 34.0 pg   MCHC 32.2 30.0 - 36.0 g/dL   RDW 40.1 02.7 - 25.3 %   Platelets 389 150 - 400 K/uL   nRBC 0.0 0.0 - 0.2 %  Glucose, capillary     Status: Abnormal   Collection Time: 08/20/23  1:09 PM  Result Value Ref Range   Glucose-Capillary 122 (H) 70 - 99 mg/dL    No results found.  Assessment/Plan: 77 yo G3P2 MWF with postmenopausal bleeding, negative endometrial biopsy, thickened endometrium on ultrasound here for additional evaluation with hysteroscopy, D&C.  Questions answered.  Pt ready to proceed.  Jerene Bears 08/20/2023, 1:24 PM

## 2023-08-20 NOTE — Anesthesia Postprocedure Evaluation (Signed)
Anesthesia Post Note  Patient: Valerie Brady  Procedure(s) Performed: DILATATION & CURETTAGE/HYSTEROSCOPY WITH MYOSURE WITH POLYP RESECTION (Vagina )     Patient location during evaluation: PACU Anesthesia Type: General Level of consciousness: awake and alert, oriented and patient cooperative Pain management: pain level controlled Vital Signs Assessment: post-procedure vital signs reviewed and stable Respiratory status: spontaneous breathing, nonlabored ventilation and respiratory function stable Cardiovascular status: blood pressure returned to baseline and stable Postop Assessment: no apparent nausea or vomiting Anesthetic complications: no   No notable events documented.  Last Vitals:  Vitals:   08/20/23 1615 08/20/23 1630  BP: (!) 156/74 (!) 170/72  Pulse: 61 (!) 58  Resp: 17 19  Temp:  36.4 C  SpO2: 97% 98%    Last Pain:  Vitals:   08/20/23 1630  TempSrc:   PainSc: 0-No pain                 Lannie Fields

## 2023-08-20 NOTE — Discharge Instructions (Signed)
Post-surgical Instructions, Outpatient Surgery ° °You may expect to feel dizzy, weak, and drowsy for as long as 24 hours after receiving the medicine that made you sleep (anesthetic). For the first 24 hours after your surgery:   °· Do not drive a car, ride a bicycle, participate in physical activities, or take public transportation until you are done taking narcotic pain medicines or as directed by Dr. Hisashi Amadon.  °· Do not drink alcohol or take tranquilizers.  °· Do not take medicine that has not been prescribed by your physicians.  °· Do not sign important papers or make important decisions while on narcotic pain medicines.  °· Have a responsible person with you.  ° °PAIN MANAGEMENT °· Motrin 800mg.  (This is the same as 4-200mg over the counter tablets of Motrin or ibuprofen.)  You may take this every eight hours or as needed for cramping.   °· Vicodin 5/325mg.  For more severe pain, take one or two tablets every four to six hours as needed for pain control.  (Remember that narcotic pain medications increase your risk of constipation.  If this becomes a problem, you may take an over the counter stool softener like Colace 100mg up to four times a day.) ° °DO'S AND DON'T'S °· Do not take a tub bath for one week.  You may shower on the first day after your surgery °· Do not do any heavy lifting for one to two weeks.  This increases the chance of bleeding. °· Do move around as you feel able.  Stairs are fine.  You may begin to exercise again as you feel able.  Do not lift any weights for two weeks. °· Do not put anything in the vagina for two weeks--no tampons, intercourse, or douching.   ° °REGULAR MEDIATIONS/VITAMINS: °· You may restart all of your regular medications as prescribed. °· You may restart all of your vitamins as you normally take them.   ° °PLEASE CALL OR SEEK MEDICAL CARE IF: °· You have persistent nausea and vomiting.  °· You have trouble eating or drinking.  °· You have an oral temperature above 100.5.   °· You have constipation that is not helped by adjusting diet or increasing fluid intake. Pain medicines are a common cause of constipation.  °· You have heavy vaginal bleeding ° °

## 2023-08-20 NOTE — Op Note (Signed)
08/20/2023  4:17 PM  PATIENT:  Valerie Brady  77 y.o. female  PRE-OPERATIVE DIAGNOSIS:  Postmenopausal bleeding Bleeding, abnormal endometrium on ultrasound  POST-OPERATIVE DIAGNOSIS:   Postmenopausal bleeding Bleeding, abnormal endometrium on ultrasound, probable endometrial polyp  PROCEDURE:  Procedure(s): DILATATION & CURETTAGE/HYSTEROSCOPY WITH MYOSURE WITH POLYP RESECTION  SURGEON:  Jerene Bears  ASSISTANTS: OR staff.   ANESTHESIA:    LMA, Dr. Salvadore Farber oversaw procedure  ESTIMATED BLOOD LOSS: 20 mL  BLOOD ADMINISTERED:none   FLUIDS: 400cc LR  UOP: 50cc  SPECIMEN:  endometrial polyp and curetting  DISPOSITION OF SPECIMEN:  PATHOLOGY  FINDINGS: probable endometrial polyp at left fundal region  DESCRIPTION OF OPERATION: Patient was taken to the operating room.  She is placed in the supine position. SCDs were on her lower extremities and functioning properly. General anesthesia with an LMA was administered without difficulty. Dr. Salvadore Farber, anesthesia, oversaw case.  Legs were then placed in the [facility] stirrups in the low lithotomy position. The legs were lifted to the high lithotomy position and the Betadine prep was used on the inner thighs perineum and vagina x3. Patient was draped in a normal standard fashion. An in and out catheterization with a red rubber Foley catheter was performed. Approximately 50 cc of clear urine was noted. A bivalve speculum was placed the vagina. The anterior lip of the cervix was grasped with single-tooth tenaculum.  A paracervical block of 1% lidocaine mixed one-to-one with epinephrine (1:100,000 units).  10 cc was used total. The cervix is dilated up to #21 [facility] dilators. The endometrial cavity sounded to 7 cm.   A Myosure hysteroscope was obtained. Normal saline was used as a hysteroscopic fluid. The hysteroscope was advanced through the endocervical canal into the endometrial cavity. The tubal ostia were noted bilaterally. Additional  findings included probable endometrial polyp to left fundal region extending down into the cervical canal.  Using a myosure reach device, this lesion was excised.  The hysteroscope was removed. A #1 toothed curette was used to curette the cavity until rough gritty texture was noted in all quadrants. With revisualization of the hysteroscope, there was no longer any abnormal findings.  A second curetting was then performed.  At this point no other procedure was needed and this procedure was ended. The hysteroscope was removed. The fluid deficit was 25 cc. The tenaculum was removed from the anterior lip of the cervix. The speculum was removed from the vagina. The prep was cleansed of the patient's skin. The legs are positioned back in the supine position. Sponge, lap, needle, instrument counts were correct x2. Patient was taken to recovery in stable condition.   COUNTS:  YES  PLAN OF CARE: Transfer to PACU

## 2023-08-21 ENCOUNTER — Ambulatory Visit: Payer: Medicare Other | Admitting: Family Medicine

## 2023-08-21 ENCOUNTER — Encounter (HOSPITAL_COMMUNITY): Payer: Self-pay | Admitting: Obstetrics & Gynecology

## 2023-08-21 LAB — SURGICAL PATHOLOGY

## 2023-08-29 NOTE — Progress Notes (Deleted)
[facility] [address] [place] [address] Phone: [phone] Subjective:    I'm seeing this patient by the request  of:  Panosh, Neta Mends, MD  CC:   [id number]  Valerie Brady is a 77 y.o. female coming in with complaint of back and neck pain. OMT on 06/21/2023. Patient states   Medications patient has been prescribed:   Taking:         Reviewed prior external information including notes and imaging from previsou exam, outside providers and external EMR if available.   As well as notes that were available from care everywhere and other healthcare systems.  Past medical history, social, surgical and family history all reviewed in electronic medical record.  No pertanent information unless stated regarding to the chief complaint.   Past Medical History:  Diagnosis Date   Carotidynia    per pt's pcp note effects right side   COVID-19 long hauler manifesting chronic muscle pain    Dry eye syndrome of both eyes    Endometrial polyp    GERD (gastroesophageal reflux disease)    Hashimoto's thyroiditis    followed by pcp   History of diverticulitis of colon 2016   History of gestational diabetes    History of recurrent UTIs    urologist---- dr Marlou Porch   History of syncope 02/2010   ED visit due to near syncope/ chest pain/ bradycardia,  found due to taking benicar,  per pcp note had negative cardiology work-up   Hyperlipidemia, mixed    cardiology--- dr Rennis Golden;   statin intolerence causes myalgias/ fatigue and zetia causes GI issues   Hypertension    Hypothyroidism    Long COVID    PMB (postmenopausal bleeding)    SI (sacroiliac) joint dysfunction    Somatic dysfunction    followed by dr Herma Carson. Katrinka Blazing;  multiple regions--- entire spine and rib cage   Type 2 diabetes mellitus (HCC)    followed by pcp  (07-08-2023  per pt check blood sugar daily in am fasting , at times in afternoon,  fasting average 120)   Wears glasses      Allergies  Allergen Reactions   Bee Venom Swelling   Egg-Derived Products Nausea And Vomiting   Enalapril Maleate Cough   Lactose Intolerance (Gi) Diarrhea   Olmesartan Other (See Comments)     Gas and chest pain/bradycardia    Sulfamethoxazole-Trimethoprim Other (See Comments)    Caused Swollen lips with anti-biotic   Almond (Diagnostic) Other (See Comments)    Gi upset   Amlodipine Other (See Comments)    Fatigue lethargy at 5 mg dose   Latex Rash   Zetia [Ezetimibe] Other (See Comments)    GI cramps     Review of Systems:  No headache, visual changes, nausea, vomiting, diarrhea, constipation, dizziness, abdominal pain, skin rash, fevers, chills, night sweats, weight loss, swollen lymph nodes, body aches, joint swelling, chest pain, shortness of breath, mood changes. POSITIVE muscle aches  Objective  There were no vitals taken for this visit.   General: No apparent distress alert and oriented x3 mood and affect normal, dressed appropriately.  HEENT: Pupils equal, extraocular movements intact  Respiratory: Patient's speak in full sentences and does not appear short of breath  Cardiovascular: No lower extremity edema, non tender, no erythema  Gait MSK:  Back   Osteopathic findings  C2 flexed rotated and side bent right C6 flexed rotated and side bent left T3 extended rotated and side  bent right inhaled rib T9 extended rotated and side bent left L2 flexed rotated and side bent right Sacrum right on right       Assessment and Plan:  No problem-specific Assessment & Plan notes found for this encounter.    Nonallopathic problems  Decision today to treat with OMT was based on Physical Exam  After verbal consent patient was treated with HVLA, ME, FPR techniques in cervical, rib, thoracic, lumbar, and sacral  areas  Patient tolerated the procedure well with improvement in symptoms  Patient given exercises, stretches and lifestyle modifications  See medications  in patient instructions if given  Patient will follow up in 4-8 weeks             Note: This dictation was prepared with Dragon dictation along with smaller phrase technology. Any transcriptional errors that result from this process are unintentional.

## 2023-08-30 ENCOUNTER — Ambulatory Visit: Payer: Medicare Other | Admitting: Family Medicine

## 2023-09-05 ENCOUNTER — Encounter: Payer: Self-pay | Admitting: Internal Medicine

## 2023-09-05 ENCOUNTER — Other Ambulatory Visit: Payer: Self-pay | Admitting: Family

## 2023-09-05 DIAGNOSIS — I10 Essential (primary) hypertension: Secondary | ICD-10-CM

## 2023-09-05 DIAGNOSIS — Z79899 Other long term (current) drug therapy: Secondary | ICD-10-CM

## 2023-09-05 MED ORDER — CARVEDILOL 25 MG PO TABS
25.0000 mg | ORAL_TABLET | Freq: Two times a day (BID) | ORAL | 0 refills | Status: DC
Start: 2023-09-05 — End: 2023-12-02

## 2023-09-11 ENCOUNTER — Ambulatory Visit (INDEPENDENT_AMBULATORY_CARE_PROVIDER_SITE_OTHER): Payer: Medicare Other | Admitting: Obstetrics & Gynecology

## 2023-09-11 ENCOUNTER — Encounter (HOSPITAL_BASED_OUTPATIENT_CLINIC_OR_DEPARTMENT_OTHER): Payer: Self-pay | Admitting: Obstetrics & Gynecology

## 2023-09-11 VITALS — BP 149/62 | HR 86 | Ht 65.5 in | Wt 167.8 lb

## 2023-09-11 DIAGNOSIS — Z9889 Other specified postprocedural states: Secondary | ICD-10-CM | POA: Diagnosis not present

## 2023-09-11 DIAGNOSIS — N8500 Endometrial hyperplasia, unspecified: Secondary | ICD-10-CM | POA: Diagnosis not present

## 2023-09-11 DIAGNOSIS — N3941 Urge incontinence: Secondary | ICD-10-CM

## 2023-09-11 DIAGNOSIS — E7841 Elevated Lipoprotein(a): Secondary | ICD-10-CM | POA: Diagnosis not present

## 2023-09-11 DIAGNOSIS — M791 Myalgia, unspecified site: Secondary | ICD-10-CM | POA: Diagnosis not present

## 2023-09-11 DIAGNOSIS — T466X5A Adverse effect of antihyperlipidemic and antiarteriosclerotic drugs, initial encounter: Secondary | ICD-10-CM | POA: Diagnosis not present

## 2023-09-11 DIAGNOSIS — E7849 Other hyperlipidemia: Secondary | ICD-10-CM | POA: Diagnosis not present

## 2023-09-11 MED ORDER — DIAZEPAM 5 MG PO TABS
ORAL_TABLET | ORAL | 0 refills | Status: DC
Start: 2023-09-11 — End: 2023-12-27

## 2023-09-11 MED ORDER — NORETHINDRONE ACETATE 5 MG PO TABS
5.0000 mg | ORAL_TABLET | Freq: Every day | ORAL | 4 refills | Status: DC
  Filled 2024-04-16 – 2024-06-11 (×2): qty 90, 90d supply, fill #0
  Filled 2024-09-09: qty 90, 90d supply, fill #1

## 2023-09-11 MED ORDER — DIAZEPAM 5 MG PO TABS
ORAL_TABLET | ORAL | 0 refills | Status: DC
Start: 2023-09-11 — End: 2023-09-11

## 2023-09-13 DIAGNOSIS — Z9889 Other specified postprocedural states: Secondary | ICD-10-CM | POA: Insufficient documentation

## 2023-09-13 DIAGNOSIS — N8500 Endometrial hyperplasia, unspecified: Secondary | ICD-10-CM | POA: Insufficient documentation

## 2023-09-13 NOTE — Progress Notes (Signed)
GYNECOLOGY  VISIT  CC:   post op recheck  HPI: 77 y.o. G12P0010 Married White or Caucasian female here for recheck after undergoing hysteroscopy with polyp resection, D&C on 08/20/2023.  She reports bleeding was minimal and has stopped.  She has minimal pain.  Pathology reviewed.    Pathology:  A. ENDOMETRIUM, CURETTAGE AND POLYP:  Endometrial hyperplasia without atypia involving endometrium and  fragments of endometrial polyp.  Negative for endometrial intraepithelial neoplasia (EIN) and carcinoma.   Discussed this diagnosis.  Recommended treatment with progesterone and repeating biopsy in 6 months.  Also recommended letting me know if she has any future bleeding.  Questions answered.  Also, she has started to have issues with urinary urgency and leakage.  This can be severe at times.  Had this in the past and used vaginal valium tablet.  Suppository use discussed but she has used tablet in the past and feels comfortable with  this.  Also we discussed pelvic PT and she would like referral.     MEDS:   Current Outpatient Medications on File Prior to Visit  Medication Sig Dispense Refill   carvedilol (COREG) 25 MG tablet Take 1 tablet (25 mg total) by mouth 2 (two) times daily with a meal. 180 tablet 0   Cholecalciferol (VITAMIN D3) 50 MCG (2000 UT) capsule Take 2,000 Units by mouth in the morning.     Coenzyme Q10 (COQ-10) 200 MG CAPS Take 200 mg by mouth in the morning.     Cranberry-Vitamin C-Vitamin E (CRANBERRY PLUS VITAMIN C) 4200-20-3 MG-MG-UNIT CAPS Take 2 capsules by mouth in the morning and at bedtime.     Cyanocobalamin (B-12) 5000 MCG CAPS Take 1 capsule by mouth once a week.     estradiol (ESTRACE) 0.1 MG/GM vaginal cream INSERT 1 gram VAGINALLY 3 TIMES WEEKLY as directed. 42.5 g 6   famotidine (PEPCID) 20 MG tablet Take 20 mg by mouth as needed for heartburn or indigestion.     gabapentin (NEURONTIN) 300 MG capsule Take 1 capsule (300 mg total) by mouth 3 (three) times daily as  needed. (Patient taking differently: Take 300 mg by mouth at bedtime.) 90 capsule 1   ibuprofen (ADVIL) 800 MG tablet Take 1 tablet (800 mg total) by mouth every 8 (eight) hours as needed. 20 tablet 0   LANCETS MICRO THIN 33G MISC Use to test blood glucose once daily. 100 each 3   levothyroxine (SYNTHROID) 100 MCG tablet Take 1 tablet (100 mcg total) by mouth daily before breakfast. 90 tablet 2   loratadine (CLARITIN) 10 MG tablet Take 10 mg by mouth daily as needed for allergies.     Melatonin 5 MG TABS Take 5 mg by mouth at bedtime.     metFORMIN (GLUCOPHAGE) 1000 MG tablet Take 1 tablet (1,000 mg total) by mouth 2 (two) times daily. 180 tablet 1   NON FORMULARY Take 25 mg by mouth at bedtime as needed (pain/sleep). CBD 25mg  tablet     ondansetron (ZOFRAN) 4 MG tablet Take 1 tablet (4 mg total) by mouth every 8 (eight) hours as needed for nausea or vomiting. 20 tablet 1   PRALUENT 150 MG/ML SOAJ ADMINISTER 1 ML(150 MG) UNDER THE SKIN EVERY 14 DAYS 2 mL 5   TRUE METRIX BLOOD GLUCOSE TEST test strip CHECK BLOOD SUGAR ONCE A DAY. 50 each PRN   No current facility-administered medications on file prior to visit.    SH:  Smoking No    PHYSICAL EXAMINATION:  BP (!) 149/62 (BP Location: Right Arm, Patient Position: Sitting, Cuff Size: Large)   Pulse 86   Ht 5' 5.5" (1.664 m)   Wt 167 lb 12.8 oz (76.1 kg)   BMI 27.50 kg/m     Physical Exam Constitutional:      Appearance: Normal appearance.  Neurological:     General: No focal deficit present.     Mental Status: She is alert.  Psychiatric:        Mood and Affect: Mood normal.      Assessment/Plan: 1. Endometrial hyperplasia - will start daily progesterone.  Options reviewed.  Will repeat endometrial biopsy in 6 months.  Appt scheduled today. - norethindrone (AYGESTIN) 5 MG tablet; Take 1 tablet (5 mg total) by mouth daily.  Dispense: 90 tablet; Refill: 4  2. Urge incontinence of urine - Ambulatory referral to Physical  Therapy - diazepam (VALIUM) 5 MG tablet; 1 tablet pv nightly prn pelvic spasm  Dispense: 20 tablet; Refill: 0  3. History of hysteroscopy

## 2023-09-15 LAB — NMR, LIPOPROFILE
Cholesterol, Total: 183 mg/dL (ref 100–199)
HDL Particle Number: 26.8 umol/L — ABNORMAL LOW (ref >=30.5)
HDL-C: 46 mg/dL (ref >39)
LDL Particle Number: 1269 nmol/L — ABNORMAL HIGH (ref <1000)
LDL Size: 20.7 nmol (ref >20.5)
LDL-C (NIH Calc): 86 mg/dL (ref 0–99)
LP-IR Score: 67 — ABNORMAL HIGH (ref <=45)
Small LDL Particle Number: 408 nmol/L (ref <=527)
Triglycerides: 309 mg/dL — ABNORMAL HIGH (ref 0–149)

## 2023-09-17 NOTE — Progress Notes (Unsigned)
Cardiology Office Note:  .   Date:  09/18/2023  ID:  Valerie Brady, DOB 12/19/1945, MRN [id number] PCP: Madelin Headings, MD  [facility] Providers Cardiologist:  None    Patient Profile: .      PMH Dyslipidemia likely FH Family history of heart disease Father - heart disease in father but lived to age 76  Statin myalgia Ezetimibe intolerance Elevated coronary artery calcium score CT calcium score 08/2021 with CAC 74.3 (60th percentile) LM 0, LAD 54.9, LCx 2.24, RCA 17.1 Type 2 diabetes mellitus Hypertension Hypothyroidism  Elevated lipoprotein a  Referred to advanced lipid disorder clinic and seen by Dr. Rennis Golden on 01/26/2022.  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  She has a family history of heart disease in her father as well as high cholesterol and has had elevated cholesterol most of her life.  At the time of the visit she had recently had COVID-19 and was struggling with fatigue thought to be from long COVID.  She has not been able to tolerate statins in the past due to myalgia.  She could not tolerate ezetimibe due to stomach cramps.  She had coronary calcium score October 2022 showing CAC score of 74.3, 60th percentile.  Her lipid panel at that time revealed total cholesterol 312, triglycerides 317, HDL 49, and direct LDL 197.  Her A1c was 6.2%. She was placed on PCSK9 inhibitor.  At follow-up appointment 08/07/22, she was tolerating Praluent very well.  She had marked improvement in her lipids.  Her LDL particle number was down to 504, LDL-C of 40, HDL-C of 42, and triglycerides 296.  Her small LDL particle number is 334.  She does have elevated LP(a) at 140.3.  At the time LP(a) was checked, she was already on PCSK9 therapy, therefore it was probably 20 to 30% lower than prior to therapy.       History of Present Illness: .   Valerie Brady is a very pleasant 77 y.o. female who is here for follow-up of dyslipidemia. NMR completed 09/11/23  revealed LDL particle number 1269, LDL-C 86, HDL 46, triglycerides 309, small LDL particle 408. She admits to missing a dose of Praluent due to a bout of COVID-19, which she contracted for the second time after an [place] cruise. She has since resumed the medication and has taken two doses each in September and October. She reports most significant concern is suffering from long COVID, which has significantly impacted her activity level. She describes a unique muscular form of long COVID, where excessive exercise leads to muscle pain and subsequent sickness for a few days. She reports that her muscles constantly work at an anaerobic level, leading to excruciating muscle pain with too much activity. This has limited her ability to exercise and maintain a high activity level. She manages to walk in the pool in warm water but cannot sustain high-intensity activities. History of diabetes, which she feels is well-controlled. She admits she could improve her diet, particularly her carbohydrate intake, but finds it challenging due to her love for food and the need to cook for her husband. She consumes lean meats most of the time, with occasional indulgence in pastrami. She denies concerning cardiac symptoms including chest pain, palpitations, shortness of breath, orthopnea, PND, edema, presyncope, or syncope.  Discussed the use of AI scribe software for clinical note transcription with the patient, who gave verbal consent to proceed.   ROS: See HPI  Studies Reviewed: Marland Kitchen   EKG Interpretation Date/Time:  Wednesday September 18 2023 11:28:46 EST Ventricular Rate:  79 PR Interval:  140 QRS Duration:  124 QT Interval:  440 QTC Calculation: 504 R Axis:   86  Text Interpretation: Normal sinus rhythm Non-specific intra-ventricular conduction delay Minimal voltage criteria for LVH, may be normal variant ( Cornell product ) Nonspecific ST abnormality No previous ECGs available Confirmed by Eligha Bridegroom  [phone]) on 09/18/2023 11:30:43 AM     Risk Assessment/Calculations:             Physical Exam:   VS:  BP 128/64   Pulse 85   Ht 5' 5.5" (1.664 m)   Wt 168 lb 8 oz (76.4 kg)   SpO2 95%   BMI 27.61 kg/m    Wt Readings from Last 3 Encounters:  09/18/23 168 lb 8 oz (76.4 kg)  09/11/23 167 lb 12.8 oz (76.1 kg)  08/20/23 165 lb (74.8 kg)    GEN: Well nourished, well developed in no acute distress NECK: No JVD; No carotid bruits CARDIAC: RRR, no murmurs, rubs, gallops RESPIRATORY:  Clear to auscultation without rales, wheezing or rhonchi  ABDOMEN: Soft, non-tender, non-distended EXTREMITIES:  No edema; No deformity     ASSESSMENT AND PLAN: .    Dyslipidemia LDL goal < 70:  LDL increased from 40 to 86 and triglycerides increased on lab work completed 09/11/23. She missed a dose of Praluent 150mg  due to illness but has resumed regular dosing. Her physical activity is limited by significant muslce pain which she attributes to long COVID. She has researched her symptoms and feels they are likely due to mitochondrial dysfunction. She is able to be more active at some times but not consistently.  Encouraged physical activity as tolerated along with heart healthy plant forward diet avoiding saturated fat, processed foods, simple carbohydrates, and sugar.  We will continue Praluent and plan for repeat lab work in 6 months.  Abnormal EKG/Elevated coronary calcium score: History of elevated coronary calcium score of 74.3 (60 percentile) in 2022.  EKG today showed minimal voltage criteria for LVH and nonspecific ST abnormality. I reviewed the tracing with her. She is asymptomatic with no significant coronary artery disease history. Consider further ischemia testing (echocardiogram or coronary CTA) in the future if symptoms develop or patient desires further investigation.  Dispo: 6 months with Dr. Rennis Golden or me  Signed, Eligha Bridegroom, NP-C

## 2023-09-18 ENCOUNTER — Encounter (HOSPITAL_BASED_OUTPATIENT_CLINIC_OR_DEPARTMENT_OTHER): Payer: Medicare Other | Admitting: Obstetrics & Gynecology

## 2023-09-18 ENCOUNTER — Ambulatory Visit (INDEPENDENT_AMBULATORY_CARE_PROVIDER_SITE_OTHER): Payer: Medicare Other | Admitting: Nurse Practitioner

## 2023-09-18 ENCOUNTER — Encounter (HOSPITAL_BASED_OUTPATIENT_CLINIC_OR_DEPARTMENT_OTHER): Payer: Self-pay | Admitting: Nurse Practitioner

## 2023-09-18 VITALS — BP 128/64 | HR 85 | Ht 65.5 in | Wt 168.5 lb

## 2023-09-18 DIAGNOSIS — E785 Hyperlipidemia, unspecified: Secondary | ICD-10-CM

## 2023-09-18 DIAGNOSIS — E7849 Other hyperlipidemia: Secondary | ICD-10-CM | POA: Diagnosis not present

## 2023-09-18 DIAGNOSIS — I10 Essential (primary) hypertension: Secondary | ICD-10-CM

## 2023-09-18 DIAGNOSIS — E7841 Elevated Lipoprotein(a): Secondary | ICD-10-CM

## 2023-09-18 DIAGNOSIS — R9431 Abnormal electrocardiogram [ECG] [EKG]: Secondary | ICD-10-CM

## 2023-09-18 NOTE — Patient Instructions (Signed)
Medication Instructions:   Your physician recommends that you continue on your current medications as directed. Please refer to the Current Medication list given to you today.   *If you need a refill on your cardiac medications before your next appointment, please call your pharmacy*   Lab Work:  Your physician recommends that you return for a FASTING NMR/CMET one week prior to appointment in May. Paperwork given to pt today.    If you have labs (blood work) drawn today and your tests are completely normal, you will receive your results only by: MyChart Message (if you have MyChart) OR A paper copy in the mail If you have any lab test that is abnormal or we need to change your treatment, we will call you to review the results.   Testing/Procedures:   None ordered.   Follow-Up: At [facility], you and your health needs are our priority.  As part of our continuing mission to provide you with exceptional heart care, we have created designated Provider Care Teams.  These Care Teams include your primary Cardiologist (physician) and Advanced Practice Providers (APPs -  Physician Assistants and Nurse Practitioners) who all work together to provide you with the care you need, when you need it.  We recommend signing up for the patient portal called "MyChart".  Sign up information is provided on this After Visit Summary.  MyChart is used to connect with patients for Virtual Visits (Telemedicine).  Patients are able to view lab/test results, encounter notes, upcoming appointments, etc.  Non-urgent messages can be sent to your provider as well.   To learn more about what you can do with MyChart, go to [email or web address].    Your next appointment:   6 month(s)  Provider:   K. Italy Hilty, MD or Lebron Conners    Other Instructions  I will send you a mychart message with appointment in May for Late am or early pm.

## 2023-09-19 ENCOUNTER — Other Ambulatory Visit: Payer: Self-pay | Admitting: Family

## 2023-09-19 ENCOUNTER — Encounter (HOSPITAL_BASED_OUTPATIENT_CLINIC_OR_DEPARTMENT_OTHER): Payer: Self-pay | Admitting: Obstetrics & Gynecology

## 2023-09-20 ENCOUNTER — Other Ambulatory Visit (HOSPITAL_BASED_OUTPATIENT_CLINIC_OR_DEPARTMENT_OTHER): Payer: Self-pay

## 2023-09-20 ENCOUNTER — Other Ambulatory Visit (HOSPITAL_BASED_OUTPATIENT_CLINIC_OR_DEPARTMENT_OTHER): Payer: Self-pay | Admitting: Obstetrics & Gynecology

## 2023-09-20 ENCOUNTER — Ambulatory Visit (INDEPENDENT_AMBULATORY_CARE_PROVIDER_SITE_OTHER): Payer: Medicare Other

## 2023-09-20 VITALS — BP 118/60 | HR 75 | Ht 65.5 in | Wt 165.8 lb

## 2023-09-20 DIAGNOSIS — R309 Painful micturition, unspecified: Secondary | ICD-10-CM | POA: Diagnosis not present

## 2023-09-20 DIAGNOSIS — N3090 Cystitis, unspecified without hematuria: Secondary | ICD-10-CM

## 2023-09-20 LAB — POCT URINALYSIS DIPSTICK
Bilirubin, UA: NEGATIVE
Blood, UA: NEGATIVE
Glucose, UA: NEGATIVE
Ketones, UA: NEGATIVE
Nitrite, UA: POSITIVE
Protein, UA: NEGATIVE
Spec Grav, UA: >=1.03 — AB (ref 1.010–1.025)
Urobilinogen, UA: NEGATIVE U/dL — AB
pH, UA: 5.5 (ref 5.0–8.0)

## 2023-09-20 MED ORDER — FLUCONAZOLE 150 MG PO TABS: 150.0000 mg | ORAL_TABLET | Freq: Once | ORAL | 0 refills | Status: AC

## 2023-09-20 MED ORDER — CEPHALEXIN 500 MG PO CAPS: 500.0000 mg | ORAL_CAPSULE | Freq: Two times a day (BID) | ORAL | 0 refills | Status: DC

## 2023-09-20 NOTE — Progress Notes (Signed)
SMPt presented to the office with pain during urination. Pt was educated and presented with information on urine sample. Urine sample obtained. Dipstick reading was read by Dr. Hyacinth Meeker. Urine culture sent off. Pt will be notified of results once they come back.

## 2023-09-24 LAB — URINE CULTURE

## 2023-09-26 ENCOUNTER — Other Ambulatory Visit (HOSPITAL_BASED_OUTPATIENT_CLINIC_OR_DEPARTMENT_OTHER): Payer: Self-pay | Admitting: *Deleted

## 2023-09-26 MED ORDER — CEFIXIME 400 MG PO CAPS: 400.0000 mg | ORAL_CAPSULE | Freq: Every day | ORAL | 0 refills | Status: AC

## 2023-09-26 NOTE — Addendum Note (Signed)
Addended by: Jerene Bears on: 09/26/2023 06:26 AM   Modules accepted: Orders

## 2023-10-01 ENCOUNTER — Encounter (HOSPITAL_BASED_OUTPATIENT_CLINIC_OR_DEPARTMENT_OTHER): Payer: Self-pay | Admitting: Obstetrics & Gynecology

## 2023-10-04 NOTE — Progress Notes (Signed)
[facility] [address] [place] [address] Phone: [phone] Subjective:    I'm seeing this patient by the request  of:  Panosh, Neta Mends, MD  CC: Back and neck pain follow-up  [id number]  Valerie Brady is a 77 y.o. female coming in with complaint of back and neck pain. OMT on 06/21/2023. Patient states back has been doing pretty well, even after she had to cancel a couple of appointments.   Medications patient has been prescribed:   Taking:      Recent urinary culture did show Klebsiella infection.   Reviewed prior external information including notes and imaging from previsou exam, outside providers and external EMR if available.   As well as notes that were available from care everywhere and other healthcare systems.  Past medical history, social, surgical and family history all reviewed in electronic medical record.  No pertanent information unless stated regarding to the chief complaint.   Past Medical History:  Diagnosis Date   Carotidynia    per pt's pcp note effects right side   COVID-19 long hauler manifesting chronic muscle pain    Dry eye syndrome of both eyes    Endometrial polyp    GERD (gastroesophageal reflux disease)    Hashimoto's thyroiditis    followed by pcp   History of diverticulitis of colon 2016   History of gestational diabetes    History of recurrent UTIs    urologist---- dr Marlou Porch   History of syncope 02/2010   ED visit due to near syncope/ chest pain/ bradycardia,  found due to taking benicar,  per pcp note had negative cardiology work-up   Hyperlipidemia, mixed    cardiology--- dr Rennis Golden;   statin intolerence causes myalgias/ fatigue and zetia causes GI issues   Hypertension    Hypothyroidism    Long COVID    PMB (postmenopausal bleeding)    SI (sacroiliac) joint dysfunction    Somatic dysfunction    followed by dr Herma Carson. Katrinka Blazing;  multiple regions--- entire spine and rib cage   Type 2 diabetes  mellitus (HCC)    followed by pcp  (07-08-2023  per pt check blood sugar daily in am fasting , at times in afternoon,  fasting average 120)   Wears glasses     Allergies  Allergen Reactions   Bee Venom Swelling   Egg-Derived Products Nausea And Vomiting   Enalapril Maleate Cough   Lactose Intolerance (Gi) Diarrhea   Olmesartan Other (See Comments)     Gas and chest pain/bradycardia    Sulfamethoxazole-Trimethoprim Other (See Comments)    Caused Swollen lips with anti-biotic   Almond (Diagnostic) Other (See Comments)    Gi upset   Amlodipine Other (See Comments)    Fatigue lethargy at 5 mg dose   Latex Rash   Zetia [Ezetimibe] Other (See Comments)    GI cramps     Review of Systems:  No headache, visual changes, nausea, vomiting, diarrhea, constipation, dizziness, abdominal pain, skin rash, fevers, chills, night sweats, weight loss, swollen lymph nodes, body aches, joint swelling, chest pain, shortness of breath, mood changes. POSITIVE muscle aches  Objective  Blood pressure 120/70, pulse 87, height 5' 5.5" (1.664 m), SpO2 97%.   General: No apparent distress alert and oriented x3 mood and affect normal, dressed appropriately.  HEENT: Pupils equal, extraocular movements intact  Respiratory: Patient's speak in full sentences and does not appear short of breath  Cardiovascular: No lower extremity edema, non tender, no  erythema  Gait MSK:  Back does have some loss lordosis.  Seems to have some more tightness noted in the thoracolumbar junction than usual.  This seems to be right greater than left.  Some difficulty with sidebending as well noted.  Osteopathic findings   T9 extended rotated and side bent left L2 flexed rotated and side bent right Sacrum right on right       Assessment and Plan:  SI (sacroiliac) joint dysfunction Chronic problem with some exacerbation.  Discussed with patient but still believes that there is a possibility of some long COVID that could be  contributing to some of the exacerbations.  We discussed which activities to do and which ones to avoid.  Will follow-up again in 6 to 8 weeks otherwise.    Nonallopathic problems  Decision today to treat with OMT was based on Physical Exam  After verbal consent patient was treated with HVLA, ME, FPR techniques in  thoracic, lumbar, and sacral  areas  Patient tolerated the procedure well with improvement in symptoms  Patient given exercises, stretches and lifestyle modifications  See medications in patient instructions if given  Patient will follow up in 4-8 weeks     The above documentation has been reviewed and is accurate and complete Judi Saa, DO         Note: This dictation was prepared with Dragon dictation along with smaller phrase technology. Any transcriptional errors that result from this process are unintentional.

## 2023-10-11 DIAGNOSIS — Z23 Encounter for immunization: Secondary | ICD-10-CM | POA: Diagnosis not present

## 2023-10-14 ENCOUNTER — Other Ambulatory Visit: Payer: Self-pay | Admitting: Obstetrics & Gynecology

## 2023-10-14 ENCOUNTER — Ambulatory Visit (INDEPENDENT_AMBULATORY_CARE_PROVIDER_SITE_OTHER): Payer: Medicare Other | Admitting: Family Medicine

## 2023-10-14 ENCOUNTER — Other Ambulatory Visit (HOSPITAL_BASED_OUTPATIENT_CLINIC_OR_DEPARTMENT_OTHER): Payer: Medicare Other

## 2023-10-14 ENCOUNTER — Encounter: Payer: Self-pay | Admitting: Family Medicine

## 2023-10-14 ENCOUNTER — Other Ambulatory Visit (INDEPENDENT_AMBULATORY_CARE_PROVIDER_SITE_OTHER): Payer: Medicare Other

## 2023-10-14 VITALS — BP 120/70 | HR 87 | Ht 65.5 in

## 2023-10-14 DIAGNOSIS — N3090 Cystitis, unspecified without hematuria: Secondary | ICD-10-CM | POA: Diagnosis not present

## 2023-10-14 DIAGNOSIS — M9902 Segmental and somatic dysfunction of thoracic region: Secondary | ICD-10-CM | POA: Diagnosis not present

## 2023-10-14 DIAGNOSIS — E039 Hypothyroidism, unspecified: Secondary | ICD-10-CM

## 2023-10-14 DIAGNOSIS — M533 Sacrococcygeal disorders, not elsewhere classified: Secondary | ICD-10-CM | POA: Diagnosis not present

## 2023-10-14 DIAGNOSIS — M9903 Segmental and somatic dysfunction of lumbar region: Secondary | ICD-10-CM | POA: Diagnosis not present

## 2023-10-14 DIAGNOSIS — M9904 Segmental and somatic dysfunction of sacral region: Secondary | ICD-10-CM | POA: Diagnosis not present

## 2023-10-14 LAB — TSH: TSH: 3.16 u[IU]/mL (ref 0.35–5.50)

## 2023-10-14 NOTE — Assessment & Plan Note (Signed)
Chronic problem with some exacerbation.  Discussed with patient but still believes that there is a possibility of some long COVID that could be contributing to some of the exacerbations.  We discussed which activities to do and which ones to avoid.  Will follow-up again in 6 to 8 weeks otherwise.

## 2023-10-14 NOTE — Patient Instructions (Addendum)
Good to see you  Happy Holiday's  See me again in  [place]

## 2023-10-14 NOTE — Progress Notes (Signed)
Tsh in more optimal range  for age .

## 2023-10-16 LAB — URINE CULTURE

## 2023-11-18 ENCOUNTER — Other Ambulatory Visit: Payer: Self-pay | Admitting: Internal Medicine

## 2023-11-27 ENCOUNTER — Encounter: Payer: Self-pay | Admitting: Family Medicine

## 2023-11-28 ENCOUNTER — Other Ambulatory Visit (HOSPITAL_COMMUNITY): Payer: Self-pay

## 2023-11-28 ENCOUNTER — Telehealth: Payer: Self-pay | Admitting: Pharmacy Technician

## 2023-11-28 NOTE — Telephone Encounter (Signed)
Pharmacy Patient Advocate Encounter   Received notification from  staff messages  that prior authorization for Praluent 150MG /ML auto-injectors is required/requested.   Insurance verification completed.   The patient is insured through [facility] .   Per test claim: PA required; PA submitted to above mentioned insurance via CoverMyMeds Key/confirmation #/[id number] Status is pending   Received this: Lindell Spar, RN  P Rx Prior [employer] team  Got a letter from [facility] that Praluent may need updated PA or not on formulary  Can you check on this please?  Thanks! Eileen Stanford

## 2023-11-29 ENCOUNTER — Telehealth: Payer: Self-pay | Admitting: Pharmacy Technician

## 2023-11-29 ENCOUNTER — Other Ambulatory Visit (HOSPITAL_COMMUNITY): Payer: Self-pay

## 2023-11-29 NOTE — Telephone Encounter (Signed)
Pharmacy Patient Advocate Encounter  Received notification from [facility] that Prior Authorization for Praluent 150MG /ML auto-injectors has been APPROVED from 11/28/23 to 11/11/24. Unable to obtain price due to refill too soon rejection, last fill date 11/20/23 next available fill date1/29/25   PA #/Case ID/Reference #: [id number]

## 2023-11-29 NOTE — Telephone Encounter (Signed)
PA request has been Approved. New Encounter created for follow up. For additional info see Pharmacy Prior Auth telephone encounter from 11/29/23.

## 2023-12-02 ENCOUNTER — Other Ambulatory Visit: Payer: Self-pay | Admitting: Family

## 2023-12-02 ENCOUNTER — Encounter: Payer: Self-pay | Admitting: Internal Medicine

## 2023-12-02 DIAGNOSIS — Z79899 Other long term (current) drug therapy: Secondary | ICD-10-CM

## 2023-12-02 DIAGNOSIS — I10 Essential (primary) hypertension: Secondary | ICD-10-CM

## 2023-12-02 MED ORDER — CARVEDILOL 25 MG PO TABS: 25.0000 mg | ORAL_TABLET | Freq: Two times a day (BID) | ORAL | 1 refills | Status: DC

## 2023-12-05 ENCOUNTER — Other Ambulatory Visit: Payer: Self-pay | Admitting: Family

## 2023-12-05 ENCOUNTER — Ambulatory Visit: Payer: Medicare Other | Attending: Obstetrics & Gynecology | Admitting: Physical Therapy

## 2023-12-05 ENCOUNTER — Other Ambulatory Visit: Payer: Self-pay

## 2023-12-05 DIAGNOSIS — R279 Unspecified lack of coordination: Secondary | ICD-10-CM | POA: Insufficient documentation

## 2023-12-05 DIAGNOSIS — Z79899 Other long term (current) drug therapy: Secondary | ICD-10-CM

## 2023-12-05 DIAGNOSIS — M6281 Muscle weakness (generalized): Secondary | ICD-10-CM | POA: Diagnosis present

## 2023-12-05 DIAGNOSIS — M62838 Other muscle spasm: Secondary | ICD-10-CM | POA: Diagnosis present

## 2023-12-05 DIAGNOSIS — R293 Abnormal posture: Secondary | ICD-10-CM | POA: Insufficient documentation

## 2023-12-05 DIAGNOSIS — N3941 Urge incontinence: Secondary | ICD-10-CM | POA: Diagnosis not present

## 2023-12-05 DIAGNOSIS — I10 Essential (primary) hypertension: Secondary | ICD-10-CM

## 2023-12-05 NOTE — Therapy (Signed)
OUTPATIENT PHYSICAL THERAPY FEMALE PELVIC EVALUATION   Patient Name: Valerie Brady MRN: [id number] DOB:[birth year], 78 y.o., female Today's Date: 12/05/2023  END OF SESSION:  PT End of Session - 12/05/23 0936     Visit Number 1    Date for PT Re-Evaluation 02/02/24    Authorization Type MCR    PT Start Time 0931    PT Stop Time 1012    PT Time Calculation (min) 41 min    Activity Tolerance Patient tolerated treatment well    Behavior During Therapy WFL for tasks assessed/performed             Past Medical History:  Diagnosis Date   Carotidynia    per pt's pcp note effects right side   COVID-19 long hauler manifesting chronic muscle pain    Dry eye syndrome of both eyes    Endometrial polyp    GERD (gastroesophageal reflux disease)    Hashimoto's thyroiditis    followed by pcp   History of diverticulitis of colon 2016   History of gestational diabetes    History of recurrent UTIs    urologist---- dr Marlou Porch   History of syncope 02/2010   ED visit due to near syncope/ chest pain/ bradycardia,  found due to taking benicar,  per pcp note had negative cardiology work-up   Hyperlipidemia, mixed    cardiology--- dr Rennis Golden;   statin intolerence causes myalgias/ fatigue and zetia causes GI issues   Hypertension    Hypothyroidism    Long COVID    PMB (postmenopausal bleeding)    SI (sacroiliac) joint dysfunction    Somatic dysfunction    followed by dr Herma Carson. Katrinka Blazing;  multiple regions--- entire spine and rib cage   Type 2 diabetes mellitus (HCC)    followed by pcp  (07-08-2023  per pt check blood sugar daily in am fasting , at times in afternoon,  fasting average 120)   Wears glasses    Past Surgical History:  Procedure Laterality Date   CESAREAN SECTION  1987   COLONOSCOPY WITH PROPOFOL  05/10/2022   dr Matthias Hughs   DILATATION & CURETTAGE/HYSTEROSCOPY WITH MYOSURE N/A 08/20/2023   Procedure: DILATATION & CURETTAGE/HYSTEROSCOPY WITH MYOSURE WITH POLYP RESECTION;  Surgeon:  Jerene Bears, MD;  Location: [facility] OR;  Service: Gynecology;  Laterality: N/A;   ORIF ANKLE FRACTURE Left 10/20/2001   @[facility]  by dr Thurston Hole;   left distal fibula   TONSILLECTOMY  1953   TUBAL LIGATION Bilateral 1989   Patient Active Problem List   Diagnosis Date Noted   Endometrial hyperplasia without atypia 09/13/2023   History of hysteroscopy 09/13/2023   Impingement of left shoulder 10/29/2022   SI (sacroiliac) joint dysfunction 08/31/2020   Nonallopathic lesion of sacral region 08/31/2020   Nonallopathic lesion of lumbar region 08/31/2020   Nonallopathic lesion of thoracic region 08/31/2020   Hyperlipidemia associated with type 2 diabetes mellitus (HCC) 03/02/2019   IBS (irritable bowel syndrome) 01/16/2015   Essential hypertension 01/16/2015   Hypothyroidism 03/05/2014   Statin intolerance 03/05/2014   History of recurrent UTIs 06/10/2013   Pre-diabetes 02/10/2013   ADVERSE REACTION TO MEDICATION 05/09/2010   ALLERGIC RHINITIS, SEASONAL, MILD 09/08/2009   CARCINOMA, SKIN, SQUAMOUS CELL 05/03/2009   Urge incontinence of urine 05/03/2009   HYPERLIPIDEMIA 05/07/2007   HYPERGLYCEMIA, FASTING 05/07/2007    PCP: Berniece Andreas, MD  REFERRING PROVIDER: Jerene Bears, MD  REFERRING DIAG: N39.41 (ICD-10-CM) - Urge incontinence of urine  THERAPY DIAG:  Muscle weakness (generalized)  Abnormal posture  Unspecified lack of coordination  Other muscle spasm  Rationale for Evaluation and Treatment: Rehabilitation  ONSET DATE: several years ago started but improved and now returned a couple months ago  SUBJECTIVE:                                                                                                                                                                                           SUBJECTIVE STATEMENT: Pt reports she had vaginal/pelvic floor spasms and this lead to urinary incontinence with urge. Not full loss but moderate about of urinary incontinence. Wears larger  pads usually once per day, sometimes will have more leakage and needs to change more often will use vaginal valium and this lowers leakage amount to only needing one.  She does report having long covid and this limits her tolerance to activity, energy levels and strength.  Fluid intake: Yes: 48oz water, no tea/coffee, sparkling water 1 per day    PAIN:  Are you having pain? No   PRECAUTIONS: None  RED FLAGS: None   WEIGHT BEARING RESTRICTIONS: No  FALLS:  Has patient fallen in last 6 months? Yes. Number of falls missed a step while out to eat, no injury   LIVING ENVIRONMENT: Lives with: lives with their family Lives in: House/apartment   OCCUPATION: retired   PLOF: Independent  PATIENT GOALS: to be continent   PERTINENT HISTORY:  CARCINOMA, SKIN, SQUAMOUS CELL, recurrent UTIs, Hypothyroidism, hypertension, IBS,  hysteroscopy with polyp resection, D&C on 10/8/202 Sexual abuse: No  BOWEL MOVEMENT: Pain with bowel movement: No Type of bowel movement:Type (Bristol Stool Scale) 4, Frequency daily - every other day, and Strain No Fully empty rectum: Yes:   Leakage: No Pads: No Fiber supplement: No  URINATION: Pain with urination: No Fully empty bladder: No straining to empty Stream: Strong and Weak Urgency: Yes: worse without valium  Frequency: sometimes every hour, 1x nightly  Leakage: Urge to void and Walking to the bathroom Pads: Yes: larer pad once daily with valium and 3-4x daily without   INTERCOURSE: Pain with intercourse:  not active  Ability to have vaginal penetration:  Yes: but not active Climax: not active Marinoff Scale: 0/3  PREGNANCY: Vaginal deliveries 1 Tearing Yes: significant tearing and stitches (second delivery) C-section deliveries 1 Currently pregnant No  PROLAPSE: No    OBJECTIVE:  Note: Objective measures were completed at Evaluation unless otherwise noted.  DIAGNOSTIC FINDINGS:   COGNITION: Overall cognitive status: Within  functional limits for tasks assessed     SENSATION: Light touch: Appears intact Proprioception: Appears intact  MUSCLE LENGTH: Bil hamstrings and adductors limited by 25%  FUNCTIONAL TESTS:  Functional squat - decreased descent by 25% and mild bil knee valgus  GAIT: WFL however decreased cadence and reports due to long covid has not been able to return to prior level of activity   POSTURE: rounded shoulders  PELVIC ALIGNMENT: WFL  LUMBARAROM/PROM:  A/PROM A/PROM  eval  Flexion Limited by 25%  Extension WFL  Right lateral flexion WFL  Left lateral flexion WFL  Right rotation Limited by 25%  Left rotation Limited by 25%   (Blank rows = not tested)  LOWER EXTREMITY ROM:  WFL  LOWER EXTREMITY MMT:  Bil hip abduction 3/5 all other hips 3+/5, knees 4+/5  PALPATION:   General  no TTP mild fascial restrictions noted throughout abdomen but no pain                External Perineal Exam redness noted at vulva but no TTP, no tension noted externaly                              Internal Pelvic Floor no TTP, no trigger points noted  Patient confirms identification and approves PT to assess internal pelvic floor and treatment Yes No emotional/communication barriers or cognitive limitation. Patient is motivated to learn. Patient understands and agrees with treatment goals and plan. PT explains patient will be examined in standing, sitting, and lying down to see how their muscles and joints work. When they are ready, they will be asked to remove their underwear so PT can examine their perineum. The patient is also given the option of providing their own chaperone as one is not provided in our facility. The patient also has the right and is explained the right to defer or refuse any part of the evaluation or treatment including the internal exam. With the patient's consent, PT will use one gloved finger to gently assess the muscles of the pelvic floor, seeing how well it contracts and  relaxes and if there is muscle symmetry. After, the patient will get dressed and PT and patient will discuss exam findings and plan of care. PT and patient discuss plan of care, schedule, attendance policy and HEP activities.  PELVIC MMT:   MMT eval  Vaginal 3/5 initially but with several compensatory strategies noted, cues for techniques and 2-3/5 strength noted, 7s, 5 reps  Internal Anal Sphincter   External Anal Sphincter   Puborectalis   Diastasis Recti   (Blank rows = not tested)        TONE: WFL - reports she did use a vaginal Valium Tuesday (two days ago) and is not having a lot of leakage since then  PROLAPSE: Not seen in hooklying   TODAY'S TREATMENT:                                                                                                                              DATE:   12/05/23 EVAL  Examination completed, findings reviewed, pt educated on POC, HEP. Pt motivated to participate in PT and agreeable to attempt recommendations.     PATIENT EDUCATION:  Education details: [id number] Person educated: Patient Education method: Programmer, multimedia, Demonstration, Actor cues, Verbal cues, and Handouts Education comprehension: verbalized understanding, returned demonstration, verbal cues required, tactile cues required, and needs further education  HOME EXERCISE PROGRAM: [id number]  ASSESSMENT:  CLINICAL IMPRESSION: Patient is a 78 y.o. female  who was seen today for physical therapy evaluation and treatment for urge incontinence but does report chronic history of pelvic floor spasms without her feeling pain. But does report she will have a lot more urinary incontinence with urge and uses her vaginal Valium and this greatly improves but will still have some urinary incontinence. Pt also reports she has long covid and this has limited her activity levels and she has felt weaker with this. Pt found to have decreased core and hip strength, decreased flexibility in spine and hips,  fascial restrictions in abdomen. Patient consented to internal pelvic floor assessment vaginally this date and found to have decreased strength, endurance, and coordination. Patient benefited from verbal cues for improved technique with pelvic floor contractions and relaxation for improved techniques as she initially has several compensatory strategies demonstrated to achieve contraction, this did improve with cuing. Pt did have trigger points felt during this assessment of superficial and deep pelvic floor today or TTP however does report she used a valium vaginally this week and unsure if she would have had tension return yet. Pt would benefit from additional PT to further address deficits.      OBJECTIVE IMPAIRMENTS: decreased activity tolerance, decreased coordination, decreased endurance, decreased mobility, difficulty walking, decreased strength, increased fascial restrictions, increased muscle spasms, impaired flexibility, improper body mechanics, and postural dysfunction.   ACTIVITY LIMITATIONS: carrying, squatting, continence, and locomotion level  PARTICIPATION LIMITATIONS: community activity  PERSONAL FACTORS: Time since onset of injury/illness/exacerbation and 1 comorbidity: medical history  are also affecting patient's functional outcome.   REHAB POTENTIAL: Good  CLINICAL DECISION MAKING: Evolving/moderate complexity  EVALUATION COMPLEXITY: Moderate   GOALS: Goals reviewed with patient? Yes  SHORT TERM GOALS: Target date: 01/02/24  Pt to be I with HEP.  Baseline: Goal status: INITIAL  2.  Pt will be able to go 2 hours in between voids without urgency or incontinence in order to improve QOL and perform all functional activities with less difficulty.   Baseline:  Goal status: INITIAL  3.  Pt will have 25% less urgency due to bladder retraining and strengthening  Baseline:  Goal status: INITIAL  4.  Pt to demonstrate at least 3/5 pelvic floor strength without compensatory  strategies and full relaxation post contraction for improved pelvic stability and decreased strain at pelvic floor/ decrease leakage.  Baseline:  Goal status: INITIAL  5.  Pt to be I with urge drill for improved urgency and decreased urinary incontinence  Baseline:  Goal status: INITIAL   LONG TERM GOALS: Target date: 02/02/24  Pt to be I with advanced HEP.  Baseline:  Goal status: INITIAL  2.  Pt will be able to go 3 hours in between voids without urgency or incontinence in order to improve QOL and perform all functional activities with less difficulty.   Baseline:  Goal status: INITIAL  3.  Pt will have 50% less urgency due to bladder retraining and strengthening  Baseline:  Goal status: INITIAL  4.  Pt to demonstrate at least 4/5 pelvic floor strength for improved pelvic  stability and decreased strain at pelvic floor/ decrease leakage.  Baseline:  Goal status: INITIAL  5.  Pt to demonstrate improved coordination of pelvic floor and breathing mechanics with 10# squat with appropriate synergistic patterns to decrease pain and leakage at least 75% of the time.    Baseline:  Goal status: INITIAL  6.  Pt to be I with voiding mechanics to improve feeling of fully evacuating bladder at least 75% of the time.  Baseline:  Goal status: INITIAL  PLAN:  PT FREQUENCY: 1x/week  PT DURATION:  8 sessions  PLANNED INTERVENTIONS: 97110-Therapeutic exercises, 97530- Therapeutic activity, 97112- Neuromuscular re-education, 97535- Self Care, 32440- Manual therapy, Patient/Family education, Taping, Dry Needling, Joint mobilization, Spinal mobilization, Scar mobilization, DME instructions, Cryotherapy, Moist heat, and Biofeedback  PLAN FOR NEXT SESSION: internal if needed and pt consents, breathing mechanics, relaxation techniques, voiding mechanics, hip and core strengthening with pelvic floor coordination, abdominal massage as needed, manual internally and/or at low back/glutes/hips  Otelia Sergeant, PT, DPT 12/04/2509:32 AM  [facility] [address], [address] [place], [place] [address] Phone # [phone] Fax [phone]

## 2023-12-10 ENCOUNTER — Encounter: Payer: Medicare Other | Admitting: Family Medicine

## 2023-12-10 NOTE — Progress Notes (Signed)
error

## 2023-12-12 ENCOUNTER — Ambulatory Visit: Payer: Medicare Other | Admitting: Physical Therapy

## 2023-12-19 ENCOUNTER — Telehealth: Payer: Self-pay | Admitting: *Deleted

## 2023-12-19 ENCOUNTER — Encounter: Payer: Medicare Other | Admitting: Physical Therapy

## 2023-12-19 NOTE — Telephone Encounter (Signed)
 Copied from CRM [id number]. Topic: Clinical - Medication Question >> Dec 19, 2023  2:57 PM Corin V wrote: Reason for CRM: Evan with [facility] to get clinical information on the patient's levothyroxine  (SYNTHROID ) 100 MCG tablet prescription. Please call Evan back.

## 2023-12-20 NOTE — Telephone Encounter (Signed)
 Contacted pharmacy and spoke to [place], [employer].   She reports they are trying to find out if pt is still taking Praluent  injection. Inform her that pt refill is recent, pt is probably still taking it but advise to contact pt to confirm.   She said they will go ahead and fill it. If pt doesn't need it, they will place the Rx back.    No further action is needed.

## 2023-12-23 ENCOUNTER — Telehealth: Payer: Self-pay

## 2023-12-23 NOTE — Telephone Encounter (Signed)
 Copied from CRM [id number]. Topic: General - Other >> Dec 23, 2023  2:52 PM Howard Macho wrote: Reason for CRM: patrick from [facility] called stating he need to get a manfacturing change for the levothyroxine  for the patient CB [phone]

## 2023-12-24 ENCOUNTER — Other Ambulatory Visit: Payer: Self-pay | Admitting: Internal Medicine

## 2023-12-24 ENCOUNTER — Telehealth: Payer: Self-pay

## 2023-12-24 ENCOUNTER — Other Ambulatory Visit (HOSPITAL_BASED_OUTPATIENT_CLINIC_OR_DEPARTMENT_OTHER): Payer: Self-pay | Admitting: Obstetrics & Gynecology

## 2023-12-24 DIAGNOSIS — N3941 Urge incontinence: Secondary | ICD-10-CM

## 2023-12-24 NOTE — Telephone Encounter (Signed)
Copied from CRM [id number]. Topic: Clinical - Medication Question >> Dec 24, 2023  2:53 PM Deaijah H wrote: Reason for CRM: [facility] called in to receive approval to change the manufacture / callback [phone]

## 2023-12-24 NOTE — Telephone Encounter (Signed)
Contacted pharmacy and spoke to [place]. Inform him of provider's approval.   No further action is needed.

## 2023-12-26 ENCOUNTER — Encounter: Payer: Medicare Other | Admitting: Physical Therapy

## 2023-12-26 NOTE — Telephone Encounter (Signed)
Spoke to [place] from the pharmacy.   No further action is needed.

## 2024-01-02 ENCOUNTER — Encounter: Payer: Medicare Other | Admitting: Physical Therapy

## 2024-01-08 NOTE — Progress Notes (Deleted)
 [facility] [address] [place] [address] Phone: [phone] Subjective:    I'm seeing this patient by the request  of:  Panosh, Neta Mends, MD  CC: back and neck pain follow up   [id number]  [name] is a 78 y.o. female coming in with complaint of back and neck pain. OMT on 10/14/2023. Patient states   Medications patient has been prescribed:   Taking:         Reviewed prior external information including notes and imaging from previsou exam, outside providers and external EMR if available.   As well as notes that were available from care everywhere and other healthcare systems.  Past medical history, social, surgical and family history all reviewed in electronic medical record.  No pertanent information unless stated regarding to the chief complaint.   Past Medical History:  Diagnosis Date   Carotidynia    per pt's pcp note effects right side   COVID-19 long hauler manifesting chronic muscle pain    Dry eye syndrome of both eyes    Endometrial polyp    GERD (gastroesophageal reflux disease)    Hashimoto's thyroiditis    followed by pcp   History of diverticulitis of colon 2016   History of gestational diabetes    History of recurrent UTIs    urologist---- dr Marlou Porch   History of syncope 02/2010   ED visit due to near syncope/ chest pain/ bradycardia,  found due to taking benicar,  per pcp note had negative cardiology work-up   Hyperlipidemia, mixed    cardiology--- dr Rennis Golden;   statin intolerence causes myalgias/ fatigue and zetia causes GI issues   Hypertension    Hypothyroidism    Long COVID    PMB (postmenopausal bleeding)    SI (sacroiliac) joint dysfunction    Somatic dysfunction    followed by dr Herma Carson. Katrinka Blazing;  multiple regions--- entire spine and rib cage   Type 2 diabetes mellitus (HCC)    followed by pcp  (07-08-2023  per pt check blood sugar daily in am fasting , at times in afternoon,  fasting average  120)   Wears glasses     Allergies  Allergen Reactions   Bee Venom Swelling   Egg-Derived Products Nausea And Vomiting   Enalapril Maleate Cough   Lactose Intolerance (Gi) Diarrhea   Olmesartan Other (See Comments)     Gas and chest pain/bradycardia    Sulfamethoxazole-Trimethoprim Other (See Comments)    Caused Swollen lips with anti-biotic   Almond (Diagnostic) Other (See Comments)    Gi upset   Amlodipine Other (See Comments)    Fatigue lethargy at 5 mg dose   Latex Rash   Zetia [Ezetimibe] Other (See Comments)    GI cramps     Review of Systems:  No headache, visual changes, nausea, vomiting, diarrhea, constipation, dizziness, abdominal pain, skin rash, fevers, chills, night sweats, weight loss, swollen lymph nodes, body aches, joint swelling, chest pain, shortness of breath, mood changes. POSITIVE muscle aches  Objective  There were no vitals taken for this visit.   General: No apparent distress alert and oriented x3 mood and affect normal, dressed appropriately.  HEENT: Pupils equal, extraocular movements intact  Respiratory: Patient's speak in full sentences and does not appear short of breath  Cardiovascular: No lower extremity edema, non tender, no erythema  Gait MSK:  Back   Osteopathic findings  T9 extended rotated and side bent left L2 flexed rotated and side bent  right Sacrum right on right       Assessment and Plan:  No problem-specific Assessment & Plan notes found for this encounter.    Nonallopathic problems  Decision today to treat with OMT was based on Physical Exam  After verbal consent patient was treated with HVLA, ME, FPR techniques in thoracic, lumbar, and sacral  areas  Patient tolerated the procedure well with improvement in symptoms  Patient given exercises, stretches and lifestyle modifications  See medications in patient instructions if given  Patient will follow up in 4-8 weeks    The above documentation has been  reviewed and is accurate and complete Judi Saa, DO          Note: This dictation was prepared with Dragon dictation along with smaller phrase technology. Any transcriptional errors that result from this process are unintentional.

## 2024-01-14 ENCOUNTER — Ambulatory Visit: Payer: Medicare Other | Admitting: Internal Medicine

## 2024-01-14 ENCOUNTER — Ambulatory Visit: Payer: Medicare Other | Admitting: Family Medicine

## 2024-01-21 ENCOUNTER — Ambulatory Visit (INDEPENDENT_AMBULATORY_CARE_PROVIDER_SITE_OTHER): Payer: Medicare Other | Admitting: Internal Medicine

## 2024-01-21 ENCOUNTER — Encounter: Payer: Self-pay | Admitting: Internal Medicine

## 2024-01-21 VITALS — BP 148/86 | HR 77 | Temp 97.7°F | Ht 65.5 in | Wt 167.0 lb

## 2024-01-21 DIAGNOSIS — E118 Type 2 diabetes mellitus with unspecified complications: Secondary | ICD-10-CM | POA: Diagnosis not present

## 2024-01-21 DIAGNOSIS — Z79899 Other long term (current) drug therapy: Secondary | ICD-10-CM | POA: Diagnosis not present

## 2024-01-21 DIAGNOSIS — I10 Essential (primary) hypertension: Secondary | ICD-10-CM | POA: Diagnosis not present

## 2024-01-21 DIAGNOSIS — Z7409 Other reduced mobility: Secondary | ICD-10-CM | POA: Diagnosis not present

## 2024-01-21 DIAGNOSIS — U099 Post covid-19 condition, unspecified: Secondary | ICD-10-CM

## 2024-01-21 DIAGNOSIS — Z7984 Long term (current) use of oral hypoglycemic drugs: Secondary | ICD-10-CM

## 2024-01-21 DIAGNOSIS — Z87440 Personal history of urinary (tract) infections: Secondary | ICD-10-CM

## 2024-01-21 LAB — POCT GLYCOSYLATED HEMOGLOBIN (HGB A1C): Hemoglobin A1C: 7 % — AB (ref 4.0–5.6)

## 2024-01-21 NOTE — Patient Instructions (Signed)
 Good to see you  7.0.  A1c  Get urine for protein   excretion today and go from there.

## 2024-01-21 NOTE — Progress Notes (Signed)
 Chief Complaint  Patient presents with   Medical Management of Chronic Issues    HPI: Valerie Brady 77 y.o. come in for Chronic disease management   Bp  on carvediol   intolerant of ace arb  doing ok  Long covid  post covid syndrome of  Off an on resp tightness in chest.  "Long Covid"    feels prodromal like. Myalgias and diarrhea some. When overdoes it  has adapted to less activity and doing better still can travel  Sleep  sometime  into  in am .   No dm sx  and no recnent UTI  on metformin  chronic Under rx s/p post m bleeding   on progesterone and fu with Dr Hyacinth Meeker    ROS: See pertinent positives and negatives per HPI.  Past Medical History:  Diagnosis Date   Carotidynia    per pt's pcp note effects right side   COVID-19 long hauler manifesting chronic muscle pain    Dry eye syndrome of both eyes    Endometrial polyp    GERD (gastroesophageal reflux disease)    Hashimoto's thyroiditis    followed by pcp   History of diverticulitis of colon 2016   History of gestational diabetes    History of recurrent UTIs    urologist---- dr Marlou Porch   History of syncope 02/2010   ED visit due to near syncope/ chest pain/ bradycardia,  found due to taking benicar,  per pcp note had negative cardiology work-up   Hyperlipidemia, mixed    cardiology--- dr Rennis Golden;   statin intolerence causes myalgias/ fatigue and zetia causes GI issues   Hypertension    Hypothyroidism    Long COVID    PMB (postmenopausal bleeding)    SI (sacroiliac) joint dysfunction    Somatic dysfunction    followed by dr Herma Carson. Katrinka Blazing;  multiple regions--- entire spine and rib cage   Type 2 diabetes mellitus (HCC)    followed by pcp  (07-08-2023  per pt check blood sugar daily in am fasting , at times in afternoon,  fasting average 120)   Wears glasses     Family History  Problem Relation Age of Onset   Hypertension Mother    Dementia Mother    Heart disease Father    Dementia Father    Diabetes Father     Hypertension Brother     Social History   Socioeconomic History   Marital status: Married    Spouse name: Not on file   Number of children: Not on file   Years of education: Not on file   Highest education level: Professional school degree (e.g., MD, DDS, DVM, JD)  Occupational History   Occupation: retired physician    Comment: internist  Tobacco Use   Smoking status: Never   Smokeless tobacco: Never  Vaping Use   Vaping status: Never Used  Substance and Sexual Activity   Alcohol use: Not Currently    Comment: occ wine   Drug use: Never   Sexual activity: Not on file  Other Topics Concern   Not on file  Social History Narrative   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   Neg tobacco    Married   HH of 5   Pet cat   Mom  Dementia   Spouse memory decline    Social Drivers of Health   Financial Resource Strain: Low Risk  (12/17/2022)   Overall Financial Resource Strain (CARDIA)    Difficulty of  Paying Living Expenses: Not hard at all  Food Insecurity: No Food Insecurity (12/17/2022)   Hunger Vital Sign    Worried About Running Out of Food in the Last Year: Never true    Ran Out of Food in the Last Year: Never true  Transportation Needs: No Transportation Needs (12/17/2022)   PRAPARE - Administrator, Civil Service (Medical): No    Lack of Transportation (Non-Medical): No  Physical Activity: Unknown (12/17/2022)   Exercise Vital Sign    Days of Exercise per Week: 0 days    Minutes of Exercise per Session: Not on file  Stress: No Stress Concern Present (12/17/2022)   [employer] of Occupational Health - Occupational Stress Questionnaire    Feeling of Stress : Only a little  Social Connections: Moderately Isolated (12/17/2022)   Social Connection and Isolation Panel [NHANES]    Frequency of Communication with Friends and Family: More than three times a week    Frequency of Social Gatherings with Friends and Family: Twice a week    Attends Religious  Services: Never    Database administrator or Organizations: No    Attends Engineer, structural: Not on file    Marital Status: Married    Outpatient Medications Prior to Visit  Medication Sig Dispense Refill   Acetaminophen (TYLENOL PO) Take by mouth.     Alirocumab (PRALUENT) 150 MG/ML SOAJ ADMINISTER 1 ML(150 MG) UNDER THE SKIN EVERY 14 DAYS 6 mL 3   carvedilol (COREG) 25 MG tablet Take 1 tablet (25 mg total) by mouth 2 (two) times daily with a meal. 180 tablet 1   Cholecalciferol (VITAMIN D3) 50 MCG (2000 UT) capsule Take 2,000 Units by mouth in the morning.     Coenzyme Q10 (COQ-10) 200 MG CAPS Take 200 mg by mouth in the morning.     CRANBERRY PO Take by mouth.     Cyanocobalamin (B-12) 5000 MCG CAPS Take 1 capsule by mouth once a week.     diazepam (VALIUM) 5 MG tablet PLACE 1 TABLET VAGINALLY NIGHTLY AS NEEDED FOR PELVIC SPASMS 20 tablet 0   estradiol (ESTRACE) 0.1 MG/GM vaginal cream INSERT 1 gram VAGINALLY 3 TIMES WEEKLY as directed. 42.5 g 6   famotidine (PEPCID) 20 MG tablet Take 20 mg by mouth as needed for heartburn or indigestion.     gabapentin (NEURONTIN) 300 MG capsule Take 1 capsule (300 mg total) by mouth 3 (three) times daily as needed. (Patient taking differently: Take 300 mg by mouth at bedtime.) 90 capsule 1   LANCETS MICRO THIN 33G MISC Use to test blood glucose once daily. 100 each 3   levothyroxine (SYNTHROID) 100 MCG tablet Take 1 tablet (100 mcg total) by mouth daily before breakfast. 90 tablet 2   Melatonin 5 MG TABS Take 5 mg by mouth at bedtime.     metFORMIN (GLUCOPHAGE) 1000 MG tablet TAKE 1 TABLET(1000 MG) BY MOUTH TWICE DAILY 180 tablet 1   NON FORMULARY Take 25 mg by mouth at bedtime as needed (pain/sleep). CBD 25mg  tablet     norethindrone (AYGESTIN) 5 MG tablet Take 1 tablet (5 mg total) by mouth daily. 90 tablet 4   TRUE METRIX BLOOD GLUCOSE TEST test strip CHECK BLOOD SUGAR ONCE A DAY. 50 each PRN   loratadine (CLARITIN) 10 MG tablet Take 10  mg by mouth daily as needed for allergies. (Patient not taking: Reported on 01/21/2024)     ondansetron (ZOFRAN) 4 MG tablet Take  1 tablet (4 mg total) by mouth every 8 (eight) hours as needed for nausea or vomiting. (Patient not taking: Reported on 01/21/2024) 20 tablet 1   cephALEXin (KEFLEX) 500 MG capsule Take 1 capsule (500 mg total) by mouth 2 (two) times daily. 14 capsule 0   Cranberry-Vitamin C-Vitamin E (CRANBERRY PLUS VITAMIN C) 4200-20-3 MG-MG-UNIT CAPS Take 2 capsules by mouth in the morning and at bedtime. (Patient not taking: Reported on 01/21/2024)     No facility-administered medications prior to visit.     EXAM:  BP (!) 148/86 (BP Location: Right Arm, Patient Position: Sitting, Cuff Size: Normal)   Pulse 77   Temp 97.7 F (36.5 C) (Oral)   Ht 5' 5.5" (1.664 m)   Wt 167 lb (75.8 kg)   SpO2 96%   BMI 27.37 kg/m   Body mass index is 27.37 kg/m.  GENERAL: vitals reviewed and listed above, alert, oriented, appears well hydrated and in no acute distress HEENT: atraumatic, conjunctiva  clear, no obvious abnormalities on inspection of external nose and ears  NECK: no obvious masses on inspection palpation  LUNGS: clear to auscultation bilaterally, no wheezes, rales or rhonchi, good air movement CV: HRRR, no clubbing cyanosis or  peripheral edema nl cap refill  MS: moves all extremities without noticeable focal  abnormality PSYCH: pleasant and cooperative, no obvious depression or anxiety Lab Results  Component Value Date   WBC 11.2 (H) 08/20/2023   HGB 12.8 08/20/2023   HCT 39.7 08/20/2023   PLT 389 08/20/2023   GLUCOSE 131 (H) 08/20/2023   CHOL 312 (H) 08/25/2021   TRIG 317.0 (H) 08/25/2021   HDL 49.30 08/25/2021   LDLDIRECT 197.0 08/25/2021   LDLCALC 174 (H) 02/27/2016   ALT 13 02/27/2023   AST 14 02/27/2023   NA 136 08/20/2023   K 3.9 08/20/2023   CL 103 08/20/2023   CREATININE 0.79 08/20/2023   BUN 11 08/20/2023   CO2 22 08/20/2023   TSH 3.16 10/14/2023    HGBA1C 7.0 (A) 01/21/2024   MICROALBUR <0.7 08/25/2021   BP Readings from Last 3 Encounters:  01/21/24 (!) 148/86  10/14/23 120/70  09/20/23 118/60   ASSESSMENT AND PLAN:  Discussed the following assessment and plan:  Controlled type 2 diabetes mellitus with complication, without long-term current use of insulin (HCC) - a1c up to 7 ok  sglt2 would be helpful exxcept her hx of utis and would not use at this time  update urine protein excretion today and con on metfomrin - Plan: POC HgB A1c, Microalbumin / creatinine urine ratio, CANCELED: Microalbumin / creatinine urine ratio  Essential hypertension  Medication management  Post-COVID chronic decreased mobility and endurance - myalgias  History of UTI Lsi and meds and  readdress in 4-6 mos with lab and  visit  Bp control disussed  hcannot take ace arb  amlodipine    consider other if not controlled at goal after lsi  Reported better at  home  -Patient advised to return or notify health care team  if  new concerns arise.  Patient Instructions  Good to see you  7.0.  A1c  Get urine for protein   excretion today and go from there.    Neta Mends. Adrian Dinovo M.D.

## 2024-01-22 ENCOUNTER — Ambulatory Visit: Admitting: Family Medicine

## 2024-01-22 ENCOUNTER — Encounter: Payer: Self-pay | Admitting: Internal Medicine

## 2024-01-22 LAB — MICROALBUMIN / CREATININE URINE RATIO
Creatinine,U: 214.8 mg/dL
Microalb Creat Ratio: 26.4 mg/g (ref 0.0–30.0)
Microalb, Ur: 5.7 mg/dL — ABNORMAL HIGH (ref 0.0–1.9)

## 2024-01-22 NOTE — Progress Notes (Signed)
 Uacr r looks ok

## 2024-02-07 NOTE — Progress Notes (Signed)
 Valerie Brady [address] [place] [address] Phone: [phone] Subjective:   [name], am serving as a scribe for Dr. Antoine Primas.  I'm seeing this patient by the request  of:  Panosh, Neta Mends, MD  CC: back and neck pain follow up   [id number]  Valerie Brady is a 78 y.o. female coming in with complaint of back and neck pain. OMT on 10/14/2023. Patient states that she has intermittent pain in R SI joint. Continues to have achy joints due to long COVID.   Medications patient has been prescribed:   Taking:         Reviewed prior external information including notes and imaging from previsou exam, outside providers and external EMR if available.   As well as notes that were available from care everywhere and other healthcare systems.  Past medical history, social, surgical and family history all reviewed in electronic medical record.  No pertanent information unless stated regarding to the chief complaint.   Past Medical History:  Diagnosis Date   Carotidynia    per pt's pcp note effects right side   COVID-19 long hauler manifesting chronic muscle pain    Dry eye syndrome of both eyes    Endometrial polyp    GERD (gastroesophageal reflux disease)    Hashimoto's thyroiditis    followed by pcp   History of diverticulitis of colon 2016   History of gestational diabetes    History of recurrent UTIs    urologist---- dr Marlou Porch   History of syncope 02/2010   ED visit due to near syncope/ chest pain/ bradycardia,  found due to taking benicar,  per pcp note had negative cardiology work-up   Hyperlipidemia, mixed    cardiology--- dr Rennis Golden;   statin intolerence causes myalgias/ fatigue and zetia causes GI issues   Hypertension    Hypothyroidism    Long COVID    PMB (postmenopausal bleeding)    SI (sacroiliac) joint dysfunction    Somatic dysfunction    followed by dr Herma Carson. Katrinka Blazing;  multiple regions--- entire spine and rib cage    Type 2 diabetes mellitus (HCC)    followed by pcp  (07-08-2023  per pt check blood sugar daily in am fasting , at times in afternoon,  fasting average 120)   Wears glasses     Allergies  Allergen Reactions   Bee Venom Swelling   Egg-Derived Products Nausea And Vomiting   Enalapril Maleate Cough   Lactose Intolerance (Gi) Diarrhea   Olmesartan Other (See Comments)     Gas and chest pain/bradycardia    Sulfamethoxazole-Trimethoprim Other (See Comments)    Caused Swollen lips with anti-biotic   Almond (Diagnostic) Other (See Comments)    Gi upset   Amlodipine Other (See Comments)    Fatigue lethargy at 5 mg dose   Latex Rash   Zetia [Ezetimibe] Other (See Comments)    GI cramps     Review of Systems:  No headache, visual changes, nausea, vomiting, diarrhea, constipation, dizziness, abdominal pain, skin rash, fevers, chills, night sweats, weight loss, swollen lymph nodes, body aches, joint swelling, chest pain, shortness of breath, mood changes. POSITIVE muscle aches  Objective  Blood pressure 120/72, pulse 64, height 5' 5.5" (1.664 m), weight 168 lb (76.2 kg), SpO2 98%.   General: No apparent distress alert and oriented x3 mood and affect normal, dressed appropriately.  HEENT: Pupils equal, extraocular movements intact  Respiratory: Patient's speak in full sentences and  does not appear short of breath  Cardiovascular: No lower extremity edema, non tender, no erythema  Gait MSK:  Back does have some loss lordosis noted.  Some tenderness to palpation noted.  Mild tightness still around the sacroiliac joints and seems to be bilateral.  Osteopathic findings  T6 extended rotated and side bent left L3 flexed rotated and side bent righ left t Sacrum right on right       Assessment and Plan:  SI (sacroiliac) joint dysfunction Sacroiliac dysfunction noted.  Discussed with patient about icing regimen and home exercises.  The patient does feel that some of the difficulty she  is having with long COVID has responded well to the gabapentin but had side effects and will try Horizant.  If no significant improvement or worsening cognition then would consider one of the new 1 although sodium channel blockers peripherally.  Patient given the name and will decide.  Will update [place] in 2 weeks.  Follow-up with me again in 2 months    Nonallopathic problems  Decision today to treat with OMT was based on Physical Exam  After verbal consent patient was treated with HVLA, ME, FPR techniques in thoracic, lumbar, and sacral  areas  Patient tolerated the procedure well with improvement in symptoms  Patient given exercises, stretches and lifestyle modifications  See medications in patient instructions if given  Patient will follow up in 4-8 weeks    The above documentation has been reviewed and is accurate and complete Judi Saa, DO          Note: This dictation was prepared with Dragon dictation along with smaller phrase technology. Any transcriptional errors that result from this process are unintentional.

## 2024-02-12 ENCOUNTER — Ambulatory Visit (INDEPENDENT_AMBULATORY_CARE_PROVIDER_SITE_OTHER): Admitting: Family Medicine

## 2024-02-12 ENCOUNTER — Encounter: Payer: Self-pay | Admitting: Family Medicine

## 2024-02-12 VITALS — BP 120/72 | HR 64 | Ht 65.5 in | Wt 168.0 lb

## 2024-02-12 DIAGNOSIS — M9903 Segmental and somatic dysfunction of lumbar region: Secondary | ICD-10-CM

## 2024-02-12 DIAGNOSIS — M9904 Segmental and somatic dysfunction of sacral region: Secondary | ICD-10-CM | POA: Diagnosis not present

## 2024-02-12 DIAGNOSIS — M9902 Segmental and somatic dysfunction of thoracic region: Secondary | ICD-10-CM | POA: Diagnosis not present

## 2024-02-12 DIAGNOSIS — M533 Sacrococcygeal disorders, not elsewhere classified: Secondary | ICD-10-CM

## 2024-02-12 MED ORDER — HORIZANT 300 MG PO TBCR: 300.0000 mg | EXTENDED_RELEASE_TABLET | Freq: Every day | ORAL | 3 refills | Status: DC

## 2024-02-12 NOTE — Patient Instructions (Addendum)
 Horizant 300mg  prescribed Stop gabapentin Jorunavx read about that in case Horizant isn't covered See you again in 7-8 weeks

## 2024-02-12 NOTE — Assessment & Plan Note (Signed)
 Sacroiliac dysfunction noted.  Discussed with patient about icing regimen and home exercises.  The patient does feel that some of the difficulty she is having with long COVID has responded well to the gabapentin but had side effects and will try Horizant.  If no significant improvement or worsening cognition then would consider one of the new 1 although sodium channel blockers peripherally.  Patient given the name and will decide.  Will update [place] in 2 weeks.  Follow-up with me again in 2 months

## 2024-02-27 ENCOUNTER — Other Ambulatory Visit: Payer: Self-pay | Admitting: Internal Medicine

## 2024-03-09 ENCOUNTER — Ambulatory Visit (HOSPITAL_BASED_OUTPATIENT_CLINIC_OR_DEPARTMENT_OTHER): Payer: Medicare Other | Admitting: Obstetrics & Gynecology

## 2024-03-10 ENCOUNTER — Other Ambulatory Visit: Payer: Self-pay | Admitting: Internal Medicine

## 2024-03-15 ENCOUNTER — Other Ambulatory Visit: Payer: Self-pay | Admitting: Family

## 2024-03-15 DIAGNOSIS — I10 Essential (primary) hypertension: Secondary | ICD-10-CM

## 2024-03-15 DIAGNOSIS — Z79899 Other long term (current) drug therapy: Secondary | ICD-10-CM

## 2024-03-16 ENCOUNTER — Encounter (HOSPITAL_BASED_OUTPATIENT_CLINIC_OR_DEPARTMENT_OTHER): Payer: Self-pay | Admitting: Obstetrics & Gynecology

## 2024-03-16 ENCOUNTER — Other Ambulatory Visit (HOSPITAL_COMMUNITY)
Admission: RE | Admit: 2024-03-16 | Discharge: 2024-03-16 | Disposition: A | Discharge status: Home / Self Care | Source: Ambulatory Visit | Attending: Obstetrics & Gynecology | Admitting: Obstetrics & Gynecology

## 2024-03-16 ENCOUNTER — Ambulatory Visit (HOSPITAL_BASED_OUTPATIENT_CLINIC_OR_DEPARTMENT_OTHER): Admitting: Obstetrics & Gynecology

## 2024-03-16 VITALS — BP 114/61 | HR 79 | Ht 65.5 in | Wt 168.2 lb

## 2024-03-16 DIAGNOSIS — N8500 Endometrial hyperplasia, unspecified: Secondary | ICD-10-CM | POA: Insufficient documentation

## 2024-03-16 NOTE — Progress Notes (Signed)
 GYNECOLOGY  VISIT  CC:   repeat endometrial biopsy  HPI: 78 y.o. G35P2012 Married White or Caucasian female here for 6 month endometrial biopsy after undergoing hysteroscopy showing endometrial hyperplasia but no EIN.  On daily progesterone and not having any issues.  No vaginal bleeding.   Unrelated, h/o UTI.  Using vaginal estrogen cream and doing well with this.  Does not need RF for estradiol  vaginal cream or oral progesterone.  Consent obtained today for biopsy.   Past Medical History:  Diagnosis Date   Carotidynia    per pt's pcp note effects right side   COVID-19 long hauler manifesting chronic muscle pain    Dry eye syndrome of both eyes    Endometrial polyp    GERD (gastroesophageal reflux disease)    Hashimoto's thyroiditis    followed by pcp   History of diverticulitis of colon 2016   History of gestational diabetes    History of recurrent UTIs    urologist---- dr Dulcy Gibney   History of syncope 02/2010   ED visit due to near syncope/ chest pain/ bradycardia,  found due to taking benicar,  per pcp note had negative cardiology work-up   Hyperlipidemia, mixed    cardiology--- dr Maximo Spar;   statin intolerence causes myalgias/ fatigue and zetia  causes GI issues   Hypertension    Hypothyroidism    Long COVID    PMB (postmenopausal bleeding)    SI (sacroiliac) joint dysfunction    Somatic dysfunction    followed by dr Lindle Rhea. [name];  multiple regions--- entire spine and rib cage   Type 2 diabetes mellitus (HCC)    followed by pcp  (07-08-2023  per pt check blood sugar daily in am fasting , at times in afternoon,  fasting average 120)   Wears glasses     MEDS:   Current Outpatient Medications on File Prior to Visit  Medication Sig Dispense Refill   Acetaminophen  (TYLENOL  PO) Take by mouth.     Alirocumab  (PRALUENT ) 150 MG/ML SOAJ ADMINISTER 1 ML(150 MG) UNDER THE SKIN EVERY 14 DAYS 6 mL 3   carvedilol  (COREG ) 25 MG tablet Take 1 tablet (25 mg total) by mouth 2 (two) times  daily with a meal. 180 tablet 1   Cholecalciferol (VITAMIN D3) 50 MCG (2000 UT) capsule Take 2,000 Units by mouth in the morning.     Coenzyme Q10 (COQ-10) 200 MG CAPS Take 200 mg by mouth in the morning.     CRANBERRY PO Take by mouth.     Cyanocobalamin  (B-12) 5000 MCG CAPS Take 1 capsule by mouth once a week.     diazepam  (VALIUM ) 5 MG tablet PLACE 1 TABLET VAGINALLY NIGHTLY AS NEEDED FOR PELVIC SPASMS 20 tablet 0   estradiol  (ESTRACE ) 0.1 MG/GM vaginal cream INSERT 1 GRAM VAGINALLY 3 TIMES WEEKLY AS DIRECTED 42.5 g 6   famotidine (PEPCID) 20 MG tablet Take 20 mg by mouth as needed for heartburn or indigestion.     gabapentin  (NEURONTIN ) 300 MG capsule Take 1 capsule (300 mg total) by mouth 3 (three) times daily as needed. (Patient taking differently: Take 300 mg by mouth at bedtime.) 90 capsule 1   Gabapentin  Enacarbil ER (HORIZANT ) 300 MG TBCR Take 1 tablet (300 mg total) by mouth daily. 30 tablet 3   LANCETS MICRO THIN 33G MISC Use to test blood glucose once daily. 100 each 3   levothyroxine  (SYNTHROID ) 100 MCG tablet TAKE 1 TABLET(100 MCG) BY MOUTH DAILY BEFORE BREAKFAST 90 tablet 2  loratadine (CLARITIN) 10 MG tablet Take 10 mg by mouth daily as needed for allergies.     Melatonin 5 MG TABS Take 5 mg by mouth at bedtime.     metFORMIN  (GLUCOPHAGE ) 1000 MG tablet TAKE 1 TABLET(1000 MG) BY MOUTH TWICE DAILY 180 tablet 1   NON FORMULARY Take 25 mg by mouth at bedtime as needed (pain/sleep). CBD 25mg  tablet     norethindrone  (AYGESTIN ) 5 MG tablet Take 1 tablet (5 mg total) by mouth daily. 90 tablet 4   ondansetron  (ZOFRAN ) 4 MG tablet Take 1 tablet (4 mg total) by mouth every 8 (eight) hours as needed for nausea or vomiting. 20 tablet 1   TRUE METRIX BLOOD GLUCOSE TEST test strip CHECK BLOOD SUGAR ONCE A DAY. 50 each PRN   No current facility-administered medications on file prior to visit.    ALLERGIES: Bee venom, Egg-derived products, Enalapril maleate, Lactose intolerance (gi),  Olmesartan, Sulfamethoxazole -trimethoprim , Almond (diagnostic), Amlodipine , Latex, and Zetia  [ezetimibe ]  SH:  married, non smoker  Review of Systems  Constitutional: Negative.   Genitourinary: Negative.     PHYSICAL EXAMINATION:    BP 114/61 (BP Location: Right Arm, Patient Position: Sitting, Cuff Size: Normal)   Pulse 79   Ht 5' 5.5" (1.664 m)   Wt 168 lb 3.2 oz (76.3 kg)   BMI 27.56 kg/m     General appearance: alert, cooperative and appears stated age  Pelvic: External genitalia:  no lesions              Urethra:  normal appearing urethra with no masses, tenderness or lesions              Bartholins and Skenes: normal                 Vagina: normal mucosa without prolapse or lesions              Cervix: no lesions              Bimanual Exam:  Uterus:  normal size, contour, position, consistency, mobility, non-tender              Adnexa: no mass, fullness, tenderness  Endometrial biopsy recommended.  Discussed with patient.  Verbal and written consent obtained.   Procedure:  Speculum placed.  Cervix visualized and cleansed with betadine  prep.  A single toothed tenaculum was applied to the anterior lip of the cervix.  Endometrial pipelle was advanced through the cervix into the endometrial cavity without difficulty.  Pipelle passed to 7cm.  Suction applied and pipelle removed with scant tissue sample obtained.  Second pass performed.  Tenaculum removed.  No bleeding noted.  Patient tolerated procedure well.  Chaperone, Myrtie Atkinson, CMA, was present for exam.  Assessment/Plan: 1. Endometrial hyperplasia (Primary) - continue daily progesterone with norethindrone .  Does not need RF. - endometrial biopsy obtained today - Surgical pathology( [place]/ [facility])

## 2024-03-17 LAB — SURGICAL PATHOLOGY

## 2024-03-25 ENCOUNTER — Encounter (HOSPITAL_BASED_OUTPATIENT_CLINIC_OR_DEPARTMENT_OTHER): Admitting: Nurse Practitioner

## 2024-03-30 NOTE — Progress Notes (Deleted)
 [name] [address] [place] [address] Phone: [phone] Subjective:    I'm seeing this patient by the request  of:  Panosh, Joaquim Muir, MD  CC:   [id number]  [name] is a 78 y.o. female coming in with complaint of back and neck pain. OMT on 02/12/2024. Patient states   Medications patient has been prescribed: gabapentin   Taking:         Reviewed prior external information including notes and imaging from previsou exam, outside providers and external EMR if available.   As well as notes that were available from care everywhere and other healthcare systems.  Past medical history, social, surgical and family history all reviewed in electronic medical record.  No pertanent information unless stated regarding to the chief complaint.   Past Medical History:  Diagnosis Date   Carotidynia    per pt's pcp note effects right side   COVID-19 long hauler manifesting chronic muscle pain    Dry eye syndrome of both eyes    Endometrial polyp    GERD (gastroesophageal reflux disease)    Hashimoto's thyroiditis    followed by pcp   History of diverticulitis of colon 2016   History of gestational diabetes    History of recurrent UTIs    urologist---- dr Dulcy Gibney   History of syncope 02/2010   ED visit due to near syncope/ chest pain/ bradycardia,  found due to taking benicar,  per pcp note had negative cardiology work-up   Hyperlipidemia, mixed    cardiology--- dr Maximo Spar;   statin intolerence causes myalgias/ fatigue and zetia  causes GI issues   Hypertension    Hypothyroidism    Long COVID    PMB (postmenopausal bleeding)    SI (sacroiliac) joint dysfunction    Somatic dysfunction    followed by dr Lindle Rhea. [name];  multiple regions--- entire spine and rib cage   Type 2 diabetes mellitus (HCC)    followed by pcp  (07-08-2023  per pt check blood sugar daily in am fasting , at times in afternoon,  fasting average 120)   Wears glasses      Allergies  Allergen Reactions   Bee Venom Swelling   Egg-Derived Products Nausea And Vomiting   Enalapril Maleate Cough   Lactose Intolerance (Gi) Diarrhea   Olmesartan Other (See Comments)     Gas and chest pain/bradycardia    Sulfamethoxazole -Trimethoprim  Other (See Comments)    Caused Swollen lips with anti-biotic   Almond (Diagnostic) Other (See Comments)    Gi upset   Amlodipine  Other (See Comments)    Fatigue lethargy at 5 mg dose   Latex Rash   Zetia  [Ezetimibe ] Other (See Comments)    GI cramps     Review of Systems:  No headache, visual changes, nausea, vomiting, diarrhea, constipation, dizziness, abdominal pain, skin rash, fevers, chills, night sweats, weight loss, swollen lymph nodes, body aches, joint swelling, chest pain, shortness of breath, mood changes. POSITIVE muscle aches  Objective  There were no vitals taken for this visit.   General: No apparent distress alert and oriented x3 mood and affect normal, dressed appropriately.  HEENT: Pupils equal, extraocular movements intact  Respiratory: Patient's speak in full sentences and does not appear short of breath  Cardiovascular: No lower extremity edema, non tender, no erythema  Gait MSK:  Back   Osteopathic findings  C2 flexed rotated and side bent right C6 flexed rotated and side bent left T3 extended rotated and side  bent right inhaled rib T9 extended rotated and side bent left L2 flexed rotated and side bent right Sacrum right on right       Assessment and Plan:  No problem-specific Assessment & Plan notes found for this encounter.    Nonallopathic problems  Decision today to treat with OMT was based on Physical Exam  After verbal consent patient was treated with HVLA, ME, FPR techniques in cervical, rib, thoracic, lumbar, and sacral  areas  Patient tolerated the procedure well with improvement in symptoms  Patient given exercises, stretches and lifestyle modifications  See  medications in patient instructions if given  Patient will follow up in 4-8 weeks             Note: This dictation was prepared with Dragon dictation along with smaller phrase technology. Any transcriptional errors that result from this process are unintentional.

## 2024-04-01 ENCOUNTER — Ambulatory Visit: Admitting: Family Medicine

## 2024-04-08 NOTE — Progress Notes (Unsigned)
 [name] [address] [place] [address] Phone: [phone] Subjective:    I'm seeing this patient by the request  of:  Panosh, Joaquim Muir, MD  CC: Fatigue, pain  [id number]  Valerie Brady is a 78 y.o. female coming in with complaint of back and neck pain. OMT on 02/12/2024. Patient states that she is having a better day today   Medications patient has been prescribed: gabapentin   Taking:         Reviewed prior external information including notes and imaging from previsou exam, outside providers and external EMR if available.   As well as notes that were available from care everywhere and other healthcare systems.  Past medical history, social, surgical and family history all reviewed in electronic medical record.  No pertanent information unless stated regarding to the chief complaint.   Past Medical History:  Diagnosis Date   Carotidynia    per pt's pcp note effects right side   COVID-19 long hauler manifesting chronic muscle pain    Dry eye syndrome of both eyes    Endometrial polyp    GERD (gastroesophageal reflux disease)    Hashimoto's thyroiditis    followed by pcp   History of diverticulitis of colon 2016   History of gestational diabetes    History of recurrent UTIs    urologist---- dr Dulcy Gibney   History of syncope 02/2010   ED visit due to near syncope/ chest pain/ bradycardia,  found due to taking benicar,  per pcp note had negative cardiology work-up   Hyperlipidemia, mixed    cardiology--- dr Maximo Spar;   statin intolerence causes myalgias/ fatigue and zetia  causes GI issues   Hypertension    Hypothyroidism    Long COVID    PMB (postmenopausal bleeding)    SI (sacroiliac) joint dysfunction    Somatic dysfunction    followed by dr Lindle Rhea. [name];  multiple regions--- entire spine and rib cage   Type 2 diabetes mellitus (HCC)    followed by pcp  (07-08-2023  per pt check blood sugar daily in am fasting , at times in  afternoon,  fasting average 120)   Wears glasses     Allergies  Allergen Reactions   Bee Venom Swelling   Egg-Derived Products Nausea And Vomiting   Enalapril Maleate Cough   Lactose Intolerance (Gi) Diarrhea   Olmesartan Other (See Comments)     Gas and chest pain/bradycardia    Sulfamethoxazole -Trimethoprim  Other (See Comments)    Caused Swollen lips with anti-biotic   Almond (Diagnostic) Other (See Comments)    Gi upset   Amlodipine  Other (See Comments)    Fatigue lethargy at 5 mg dose   Latex Rash   Zetia  [Ezetimibe ] Other (See Comments)    GI cramps     Review of Systems:  No headache, visual changes, nausea, vomiting, diarrhea, constipation, dizziness, abdominal pain, skin rash, fevers, chills, night sweats, weight loss, swollen lymph nodes, body aches, joint swelling, chest pain, shortness of breath, mood changes. POSITIVE muscle aches more fatigue than anything else  Objective  Blood pressure 118/72, pulse 82, height 5' 5.5" (1.664 m), weight 160 lb (72.6 kg), SpO2 97%.   General: No apparent distress alert and oriented x3 mood and affect normal, dressed appropriately.  HEENT: Pupils equal, extraocular movements intact  Respiratory: Patient's speak in full sentences and does not appear short of breath  Cardiovascular: No lower extremity edema, non tender, no erythema  MSK:  Back does have  some loss of lordosis and some tenderness to palpation diffusely.  Patient does have significant tenderness still over the right sacroiliac joint.  Some tightness noted in the left hip flexor.  Osteopathic findings  C2 flexed rotated and side bent right C6 flexed rotated and side bent left T3 extended rotated and side bent right inhaled rib T9 extended rotated and side bent left L2 flexed rotated and side bent right L3 flexed rotated and side bent left Sacrum right on right       Assessment and Plan:  SI (sacroiliac) joint dysfunction Chronic problem with mild  exacerbation.  Will continue to monitor.  Patient was still concerned that a lot of the muscle discomfort and pain is secondary to some of the long COVID and is still having difficulty.  Will look into other treatment options if possible do feel that other laboratory this area at next patient wants to Other Cystic Workouts and We Will See How That Goes.  Discussed Which Activities to Do and Which Ones to Avoid Anxiety Slowly.  Follow-Up in 6 to 8 Weeks    Nonallopathic problems  Decision today to treat with OMT was based on Physical Exam  After verbal consent patient was treated with HVLA, ME, FPR techniques in cervical, rib, thoracic, lumbar, and sacral  areas  Patient tolerated the procedure well with improvement in symptoms  Patient given exercises, stretches and lifestyle modifications  See medications in patient instructions if given  Patient will follow up in 4-8 weeks    The above documentation has been reviewed and is accurate and complete Kiyoshi Schaab M Saajan Willmon, DO          Note: This dictation was prepared with Dragon dictation along with smaller phrase technology. Any transcriptional errors that result from this process are unintentional.

## 2024-04-10 ENCOUNTER — Encounter: Payer: Self-pay | Admitting: Family Medicine

## 2024-04-10 ENCOUNTER — Ambulatory Visit (INDEPENDENT_AMBULATORY_CARE_PROVIDER_SITE_OTHER): Admitting: Family Medicine

## 2024-04-10 VITALS — BP 118/72 | HR 82 | Ht 65.5 in | Wt 160.0 lb

## 2024-04-10 DIAGNOSIS — M9904 Segmental and somatic dysfunction of sacral region: Secondary | ICD-10-CM | POA: Diagnosis not present

## 2024-04-10 DIAGNOSIS — M533 Sacrococcygeal disorders, not elsewhere classified: Secondary | ICD-10-CM | POA: Diagnosis not present

## 2024-04-10 DIAGNOSIS — M9901 Segmental and somatic dysfunction of cervical region: Secondary | ICD-10-CM | POA: Diagnosis not present

## 2024-04-10 DIAGNOSIS — M9908 Segmental and somatic dysfunction of rib cage: Secondary | ICD-10-CM

## 2024-04-10 DIAGNOSIS — M9902 Segmental and somatic dysfunction of thoracic region: Secondary | ICD-10-CM | POA: Diagnosis not present

## 2024-04-10 DIAGNOSIS — M9903 Segmental and somatic dysfunction of lumbar region: Secondary | ICD-10-CM | POA: Diagnosis not present

## 2024-04-10 NOTE — Assessment & Plan Note (Signed)
 Chronic problem with mild exacerbation.  Will continue to monitor.  Patient was still concerned that a lot of the muscle discomfort and pain is secondary to some of the long COVID and is still having difficulty.  Will look into other treatment options if possible do feel that other laboratory this area at next patient wants to Other Cystic Workouts and We Will See How That Goes.  Discussed Which Activities to Do and Which Ones to Avoid Anxiety Slowly.  Follow-Up in 6 to 8 Weeks

## 2024-04-10 NOTE — Patient Instructions (Signed)
 Exercises Consider labs See me again in 8 weeks

## 2024-04-15 ENCOUNTER — Encounter (HOSPITAL_BASED_OUTPATIENT_CLINIC_OR_DEPARTMENT_OTHER): Payer: Self-pay | Admitting: Nurse Practitioner

## 2024-04-15 ENCOUNTER — Other Ambulatory Visit (HOSPITAL_BASED_OUTPATIENT_CLINIC_OR_DEPARTMENT_OTHER): Payer: Self-pay

## 2024-04-15 ENCOUNTER — Ambulatory Visit (INDEPENDENT_AMBULATORY_CARE_PROVIDER_SITE_OTHER): Admitting: Nurse Practitioner

## 2024-04-15 VITALS — BP 124/84 | HR 81 | Ht 66.0 in | Wt 166.4 lb

## 2024-04-15 DIAGNOSIS — I2510 Atherosclerotic heart disease of native coronary artery without angina pectoris: Secondary | ICD-10-CM

## 2024-04-15 DIAGNOSIS — E7849 Other hyperlipidemia: Secondary | ICD-10-CM | POA: Diagnosis not present

## 2024-04-15 DIAGNOSIS — R9431 Abnormal electrocardiogram [ECG] [EKG]: Secondary | ICD-10-CM

## 2024-04-15 DIAGNOSIS — E7841 Elevated Lipoprotein(a): Secondary | ICD-10-CM | POA: Diagnosis not present

## 2024-04-15 DIAGNOSIS — E785 Hyperlipidemia, unspecified: Secondary | ICD-10-CM

## 2024-04-15 MED ORDER — REPATHA SURECLICK 140 MG/ML ~~LOC~~ SOAJ
140.0000 mg | SUBCUTANEOUS | 3 refills | Status: DC
  Filled 2024-04-15: qty 2, 28d supply, fill #0
  Filled 2024-05-14: qty 2, 28d supply, fill #1
  Filled 2024-06-11: qty 2, 28d supply, fill #2
  Filled 2024-07-09: qty 2, 28d supply, fill #3

## 2024-04-15 NOTE — Patient Instructions (Signed)
 Medication Instructions:   START Repatha  Inject 140 mg into the skin every 14 (fourteen) days.   *If you need a refill on your cardiac medications before your next appointment, please call your pharmacy*  Lab Work:  Your physician recommends that you return for a FASTING NMR, fasting after midnight, after 4 injections of Repatha . Patient given paperwork today.   If you have labs (blood work) drawn today and your tests are completely normal, you will receive your results only by: MyChart Message (if you have MyChart) OR A paper copy in the mail If you have any lab test that is abnormal or we need to change your treatment, we will call you to review the results.  Testing/Procedures:  None ordered.  Follow-Up: At [facility], you and your health needs are our priority.  As part of our continuing mission to provide you with exceptional heart care, our providers are all part of one team.  This team includes your primary Cardiologist (physician) and Advanced Practice Providers or APPs (Physician Assistants and Nurse Practitioners) who all work together to provide you with the care you need, when you need it.  Your next appointment:   1 year(s)  Provider:   K. Italy Hilty, MD or Slater Duncan, NP    We recommend signing up for the patient portal called "MyChart".  Sign up information is provided on this After Visit Summary.  MyChart is used to connect with patients for Virtual Visits (Telemedicine).  Patients are able to view lab/test results, encounter notes, upcoming appointments, etc.  Non-urgent messages can be sent to your provider as well.   To learn more about what you can do with MyChart, go to [email or web address].   Other Instructions  Your physician wants you to follow-up in: 1 year.  You will receive a reminder letter in the mail two months in advance. If you don't receive a letter, please call our office to schedule the follow-up appointment.

## 2024-04-15 NOTE — Progress Notes (Signed)
 Cardiology Office Note:  .   Date:  04/15/2024  ID:  Valerie Brady, DOB July 10, 1946, MRN [id number] PCP: Reginal Capra, MD  [facility] Providers Cardiologist:  None    Patient Profile: .      PMH Dyslipidemia likely FH Family history of heart disease Father - heart disease in father but lived to age 77  Statin myalgia Ezetimibe  intolerance Elevated coronary artery calcium  score CT calcium  score 08/2021 with CAC 74.3 (60th percentile) LM 0, LAD 54.9, LCx 2.24, RCA 17.1 Type 2 diabetes mellitus Hypertension Hypothyroidism  Elevated lipoprotein a  Referred to advanced lipid disorder clinic and seen by Dr. Maximo Spar on 01/26/2022.  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  She has a family history of heart disease in her father as well as high cholesterol and has had elevated cholesterol most of her life.  At the time of the visit she had recently had COVID-19 and was struggling with fatigue thought to be from long COVID.  She has not been able to tolerate statins in the past due to myalgia.  She could not tolerate ezetimibe  due to stomach cramps.  She had coronary calcium  score October 2022 showing CAC score of 74.3, 60th percentile.  Her lipid panel at that time revealed total cholesterol 312, triglycerides 317, HDL 49, and direct LDL 197.  Her A1c was 6.2%. She was placed on PCSK9 inhibitor.  At follow-up appointment 08/07/22, she was tolerating Praluent  very well.  She had marked improvement in her lipids.  Her LDL particle number was down to 504, LDL-C of 40, HDL-C of 42, and triglycerides 296.  Her small LDL particle number is 334.  She does have elevated LP(a) at 140.3.  At the time LP(a) was checked, she was already on PCSK9 therapy, therefore it was probably 20 to 30% lower than prior to therapy.  Seen by me 09/18/23. NMR completed 09/11/23 revealed LDL particle number 1269, LDL-C 86, HDL 46, triglycerides 309, small LDL particle 408. Admitted to missing  a dose of Praluent  due to a bout of COVID-19, which she contracted for the second time after an [place] cruise. She has since resumed the medication and has taken two doses each in September and October. Concerned about decreased activity tolerance since COVID. She describes a unique muscular form of long COVID, where excessive exercise leads to muscle pain and subsequent sickness for a few days. She reports that her muscles constantly work at an anaerobic level, leading to excruciating muscle pain with too much activity. This has limited her ability to exercise and maintain a high activity level. She manages to walk in the pool in warm water but cannot sustain high-intensity activities. She admits she could improve her diet, particularly her carbohydrate intake, but finds it challenging due to her love for food and the need to cook for her husband. She consumes lean meats most of the time, with occasional indulgence in pastrami. Her EKG revealed minimal voltage criteria for LVH and nonspecific ST abnormality. She was asymptomatic. We will consider further ischemia testing (echocardiogram or coronary CTA) in the future if symptoms develop or patient desires further investigation. Praluent  wished to work on lifestyle modification and repeat labs in 6 months.        History of Present Illness: .    History of Present Illness Valerie Brady is a very pleasant 78 year old female who presents for follow-up of dyslipidemia.  Unfortunately, she continues to suffer from  symptoms associated with long COVID with persistent fatigue and muscle aches.She experiences flu-like symptoms, including muscle aches and sweats, primarily in the mornings. The severity varies, affecting her daily activities. On severe days, she feels extremely fatigued and unable to engage in activities, while on better days, she manages to be more active. After a few good days, she anticipates a severe day approximately once a week or every ten  days, during which she is unable to perform any activities due to extreme fatigue and muscle pain. Standing and walking become painful, significantly limiting her exercise capacity. She is currently taking Praluent  injections. Two months ago, she experienced a ten-day delay in receiving her medication due to an issue with automatic refills at [facility]. She reports no side effects from the injections.  No chest pain, shortness of breath, orthopnea, PND, edema, palpitations, presyncope, or syncope.  She tries to do chair exercises at home when tolerated. Admits that diet is not optimal.   Discussed the use of AI scribe software for clinical note transcription with the patient, who gave verbal consent to proceed.   ROS: See HPI       Studies Reviewed: .         Risk Assessment/Calculations:             Physical Exam:   VS:  BP 124/84   Pulse 81   Ht 5\' 6"  (1.676 m)   Wt 166 lb 6.4 oz (75.5 kg)   SpO2 98%   BMI 26.86 kg/m    Wt Readings from Last 3 Encounters:  04/15/24 166 lb 6.4 oz (75.5 kg)  04/10/24 160 lb (72.6 kg)  03/16/24 168 lb 3.2 oz (76.3 kg)    GEN: Well nourished, well developed in no acute distress NECK: No JVD; No carotid bruits CARDIAC: RRR, no murmurs, rubs, gallops RESPIRATORY:  Clear to auscultation without rales, wheezing or rhonchi  ABDOMEN: Soft, non-tender, non-distended EXTREMITIES:  No edema; No deformity     ASSESSMENT AND PLAN: .    Dyslipidemia LDL goal < 70: Lipid panel completed 09/11/2023 with LDL particle #1269, LDL-C 86, triglycerides 309, total cholesterol 183, and small LDL particle #408.  She has been tolerating Praluent  without concerning side effects, however she has difficulty getting the medication from [facility] at times.  We discussed using [facility] for further refills.  It appears her insurance now prefers Repatha .  We will have her repeat NMR after 4 injections of Repatha . She is agreeable to the change. Her physical activity is  limited by significant muslce pain which she attributes to long COVID. Encouraged physical activity as tolerated along with heart healthy plant forward diet avoiding saturated fat, processed foods, simple carbohydrates, and sugar.   CAD: CT calcium  score 08/2021 with CAC of 74.3 (63rd percentile) with bulk of calcification in LAD. She does not participate in exertional activity on a consistent basis but denies chest pain, shortness of breath, palpitations, or other symptoms concerning for angina.  No indication for further ischemia evaluation at this time.  Abnormal EKG/Elevated coronary calcium  score: EKG 09/2023  showed minimal voltage criteria for LVH and nonspecific ST abnormality. We discussed these findings. She is asymptomatic with no significant coronary artery disease history and no concerning symptoms. Consider further ischemia testing (echocardiogram or coronary CTA) in the future if symptoms develop or patient desires further investigation.  Dispo: 1 year with Dr. Maximo Spar or me  Signed, Slater Duncan, NP-C

## 2024-04-16 ENCOUNTER — Other Ambulatory Visit (HOSPITAL_BASED_OUTPATIENT_CLINIC_OR_DEPARTMENT_OTHER): Payer: Self-pay

## 2024-04-16 MED ORDER — HORIZANT 300 MG PO TBCR: 300.0000 mg | EXTENDED_RELEASE_TABLET | Freq: Every day | ORAL | 3 refills | Status: DC

## 2024-04-16 MED ORDER — PRALUENT 150 MG/ML ~~LOC~~ SOAJ: 150.0000 mg | SUBCUTANEOUS | 2 refills | Status: DC

## 2024-04-16 MED ORDER — GABAPENTIN 300 MG PO CAPS
300.0000 mg | ORAL_CAPSULE | Freq: Every day | ORAL | 3 refills | Status: DC
  Filled 2024-06-30: qty 90, 90d supply, fill #0

## 2024-05-08 ENCOUNTER — Other Ambulatory Visit (HOSPITAL_BASED_OUTPATIENT_CLINIC_OR_DEPARTMENT_OTHER): Payer: Self-pay

## 2024-05-08 MED FILL — Estradiol Vaginal Cream 0.01%: VAGINAL | 90 days supply | Qty: 42.5 | Fill #0 | Status: AC

## 2024-05-18 ENCOUNTER — Encounter (HOSPITAL_BASED_OUTPATIENT_CLINIC_OR_DEPARTMENT_OTHER): Payer: Self-pay | Admitting: Obstetrics & Gynecology

## 2024-05-19 ENCOUNTER — Encounter (HOSPITAL_BASED_OUTPATIENT_CLINIC_OR_DEPARTMENT_OTHER): Payer: Self-pay

## 2024-05-19 ENCOUNTER — Ambulatory Visit (HOSPITAL_BASED_OUTPATIENT_CLINIC_OR_DEPARTMENT_OTHER)

## 2024-05-19 ENCOUNTER — Other Ambulatory Visit (HOSPITAL_BASED_OUTPATIENT_CLINIC_OR_DEPARTMENT_OTHER): Payer: Self-pay

## 2024-05-19 VITALS — BP 120/57 | HR 73 | Ht 66.0 in | Wt 166.8 lb

## 2024-05-19 DIAGNOSIS — R319 Hematuria, unspecified: Secondary | ICD-10-CM

## 2024-05-19 DIAGNOSIS — N390 Urinary tract infection, site not specified: Secondary | ICD-10-CM | POA: Diagnosis not present

## 2024-05-19 LAB — POCT URINALYSIS DIPSTICK
Bilirubin, UA: NEGATIVE
Blood, UA: POSITIVE
Glucose, UA: NEGATIVE
Ketones, UA: NEGATIVE
Nitrite, UA: POSITIVE
Protein, UA: POSITIVE — AB
Spec Grav, UA: 1.025 (ref 1.010–1.025)
Urobilinogen, UA: 0.2 U/dL
pH, UA: 5.5 (ref 5.0–8.0)

## 2024-05-19 MED ORDER — NITROFURANTOIN MONOHYD MACRO 100 MG PO CAPS
100.0000 mg | ORAL_CAPSULE | Freq: Two times a day (BID) | ORAL | 0 refills | Status: DC
  Filled 2024-05-19: qty 10, 5d supply, fill #0

## 2024-05-19 NOTE — Progress Notes (Signed)
 NURSE VISIT- UTI SYMPTOMS   SUBJECTIVE:  Valerie Brady is a 78 y.o. [phone] female here for UTI symptoms. She is a GYN patient. She reports dysuria.  OBJECTIVE:  BP (!) 120/57   Pulse 73   Ht 5' 6 (1.676 m)   Wt 166 lb 12.8 oz (75.7 kg)   BMI 26.92 kg/m   Appears well, in no apparent distress  No results found for this or any previous visit (from the past 24 hours).  ASSESSMENT: GYN patient with UTI symptoms and positive nitrites  PLAN: Visit routed to or discussed with:  Rx sent today: Yes Urine culture sent Call or return to clinic prn if these symptoms worsen or fail to improve as anticipated. Follow-up: as needed

## 2024-05-22 ENCOUNTER — Ambulatory Visit (HOSPITAL_BASED_OUTPATIENT_CLINIC_OR_DEPARTMENT_OTHER): Payer: Self-pay | Admitting: Certified Nurse Midwife

## 2024-05-22 ENCOUNTER — Other Ambulatory Visit (HOSPITAL_BASED_OUTPATIENT_CLINIC_OR_DEPARTMENT_OTHER): Payer: Self-pay | Admitting: Certified Nurse Midwife

## 2024-05-22 LAB — URINE CULTURE

## 2024-05-22 MED ORDER — CEFIXIME 400 MG PO CAPS: 400.0000 mg | ORAL_CAPSULE | Freq: Every day | ORAL | 1 refills | Status: AC

## 2024-05-25 ENCOUNTER — Other Ambulatory Visit: Payer: Self-pay | Admitting: Family

## 2024-05-29 ENCOUNTER — Other Ambulatory Visit: Payer: Self-pay

## 2024-05-29 MED ORDER — METFORMIN HCL 1000 MG PO TABS
1000.0000 mg | ORAL_TABLET | Freq: Two times a day (BID) | ORAL | 1 refills | Status: DC
  Filled 2024-08-31: qty 180, 90d supply, fill #0

## 2024-06-04 NOTE — Progress Notes (Unsigned)
 [facility] [address] [place] [address] Phone: [phone] Subjective:   Valerie [facility], am serving as a scribe for Dr. Arthea Claudene.  I'm seeing this patient by the request  of:  Panosh, Apolinar POUR, MD  CC: Right sided leg and back pain  YEP:Dlagzrupcz  Valerie Brady is a 78 y.o. female coming in with complaint of back and neck pain. OMT 04/10/2024. Patient states that she has pain in the R SI joint since yesterday. Also having some pain in the R quad which she feels is coming from lumbar spine.   Medications patient has been prescribed: None  Taking:         Reviewed prior external information including notes and imaging from previsou exam, outside providers and external EMR if available.   As well as notes that were available from care everywhere and other healthcare systems.  Past medical history, social, surgical and family history all reviewed in electronic medical record.  No pertanent information unless stated regarding to the chief complaint.   Past Medical History:  Diagnosis Date   Carotidynia    per pt's pcp note effects right side   COVID-19 long hauler manifesting chronic muscle pain    Dry eye syndrome of both eyes    Endometrial polyp    GERD (gastroesophageal reflux disease)    Hashimoto's thyroiditis    followed by pcp   History of diverticulitis of colon 2016   History of gestational diabetes    History of recurrent UTIs    urologist---- dr cam   History of syncope 02/2010   ED visit due to near syncope/ chest pain/ bradycardia,  found due to taking benicar,  per pcp note had negative cardiology work-up   Hyperlipidemia, mixed    cardiology--- dr mona;   statin intolerence causes myalgias/ fatigue and zetia  causes GI issues   Hypertension    Hypothyroidism    Long COVID    PMB (postmenopausal bleeding)    SI (sacroiliac) joint dysfunction    Somatic dysfunction    followed by dr jeneane. claudene;   multiple regions--- entire spine and rib cage   Type 2 diabetes mellitus (HCC)    followed by pcp  (07-08-2023  per pt check blood sugar daily in am fasting , at times in afternoon,  fasting average 120)   Wears glasses     Allergies  Allergen Reactions   Bee Venom Swelling   Egg-Derived Products Nausea And Vomiting   Enalapril Maleate Cough   Lactose Intolerance (Gi) Diarrhea   Olmesartan Other (See Comments)     Gas and chest pain/bradycardia    Sulfamethoxazole -Trimethoprim  Other (See Comments)    Caused Swollen lips with anti-biotic   Almond (Diagnostic) Other (See Comments)    Gi upset   Amlodipine  Other (See Comments)    Fatigue lethargy at 5 mg dose   Latex Rash   Zetia  [Ezetimibe ] Other (See Comments)    GI cramps     Review of Systems:  No headache, visual changes, nausea, vomiting, diarrhea, constipation, dizziness, abdominal pain, skin rash, fevers, chills, night sweats, weight loss, swollen lymph nodes, body aches, joint swelling, chest pain, shortness of breath, mood changes. POSITIVE muscle aches  Objective  Blood pressure 122/82, pulse 82, height 5' 6 (1.676 m), weight 165 lb (74.8 kg), SpO2 98%.   General: No apparent distress alert and oriented x3 mood and affect normal, dressed appropriately.  HEENT: Pupils equal, extraocular movements intact  Respiratory: Patient's speak in full sentences and does not appear short of breath  Cardiovascular: No lower extremity edema, non tender, no erythema  Gait MSK:  Back does have tightness noted on the right side of the back.  Significant seems to be more over the sacroiliac joint.  Negative straight leg test noted.  Osteopathic findings  C2 flexed rotated and side bent right C6 flexed rotated and side bent left T3 extended rotated and side bent right inhaled rib T9 extended rotated and side bent left L2 flexed rotated and side bent right L3 flexed rotated and side bent left L4 flexed rotated and side bent  right Sacrum right on right       Assessment and Plan:  SI (sacroiliac) joint dysfunction Will continue to work on core strengthening and posture.  Discussed with patient about icing regimen and home exercises.  Discussed with patient about the long COVID the patient continues to have difficulty.  Increase activity slowly.  Follow-up with me again in 6 to 8 weeks.    Nonallopathic problems  Decision today to treat with OMT was based on Physical Exam  After verbal consent patient was treated with  ME, FPR techniques in cervical, rib, thoracic, lumbar, and sacral  areas  Patient tolerated the procedure well with improvement in symptoms  Patient given exercises, stretches and lifestyle modifications  See medications in patient instructions if given  Patient will follow up in 8 weeks    The above documentation has been reviewed and is accurate and complete Helma Argyle M Mariame Rybolt, DO          Note: This dictation was prepared with Dragon dictation along with smaller phrase technology. Any transcriptional errors that result from this process are unintentional.

## 2024-06-05 ENCOUNTER — Encounter: Payer: Self-pay | Admitting: Family Medicine

## 2024-06-05 ENCOUNTER — Ambulatory Visit: Admitting: Family Medicine

## 2024-06-05 VITALS — BP 122/82 | HR 82 | Ht 66.0 in | Wt 165.0 lb

## 2024-06-05 DIAGNOSIS — M9901 Segmental and somatic dysfunction of cervical region: Secondary | ICD-10-CM

## 2024-06-05 DIAGNOSIS — M9908 Segmental and somatic dysfunction of rib cage: Secondary | ICD-10-CM | POA: Diagnosis not present

## 2024-06-05 DIAGNOSIS — M9904 Segmental and somatic dysfunction of sacral region: Secondary | ICD-10-CM

## 2024-06-05 DIAGNOSIS — M9903 Segmental and somatic dysfunction of lumbar region: Secondary | ICD-10-CM

## 2024-06-05 DIAGNOSIS — M533 Sacrococcygeal disorders, not elsewhere classified: Secondary | ICD-10-CM | POA: Diagnosis not present

## 2024-06-05 DIAGNOSIS — M9902 Segmental and somatic dysfunction of thoracic region: Secondary | ICD-10-CM

## 2024-06-05 NOTE — Assessment & Plan Note (Signed)
 Will continue to work on core strengthening and posture.  Discussed with patient about icing regimen and home exercises.  Discussed with patient about the long COVID the patient continues to have difficulty.  Increase activity slowly.  Follow-up with me again in 6 to 8 weeks.

## 2024-06-05 NOTE — Patient Instructions (Signed)
 Hip Flexor stretch one knee up one knee down  See you again in 2 months

## 2024-06-11 ENCOUNTER — Other Ambulatory Visit: Payer: Self-pay | Admitting: Family

## 2024-06-11 ENCOUNTER — Other Ambulatory Visit (HOSPITAL_BASED_OUTPATIENT_CLINIC_OR_DEPARTMENT_OTHER): Payer: Self-pay

## 2024-06-11 ENCOUNTER — Other Ambulatory Visit: Payer: Self-pay

## 2024-06-11 DIAGNOSIS — I10 Essential (primary) hypertension: Secondary | ICD-10-CM

## 2024-06-11 DIAGNOSIS — Z79899 Other long term (current) drug therapy: Secondary | ICD-10-CM

## 2024-06-11 MED FILL — Levothyroxine Sodium Tab 100 MCG: ORAL | 90 days supply | Qty: 90 | Fill #0 | Status: AC

## 2024-06-12 ENCOUNTER — Encounter: Payer: Self-pay | Admitting: Internal Medicine

## 2024-06-12 DIAGNOSIS — I10 Essential (primary) hypertension: Secondary | ICD-10-CM

## 2024-06-12 DIAGNOSIS — Z79899 Other long term (current) drug therapy: Secondary | ICD-10-CM

## 2024-06-16 ENCOUNTER — Other Ambulatory Visit (HOSPITAL_BASED_OUTPATIENT_CLINIC_OR_DEPARTMENT_OTHER): Payer: Self-pay

## 2024-06-16 MED ORDER — CARVEDILOL 25 MG PO TABS
25.0000 mg | ORAL_TABLET | Freq: Two times a day (BID) | ORAL | 1 refills | Status: DC
  Filled 2024-06-16: qty 180, 90d supply, fill #0
  Filled 2024-09-09: qty 180, 90d supply, fill #1

## 2024-07-01 ENCOUNTER — Other Ambulatory Visit (HOSPITAL_BASED_OUTPATIENT_CLINIC_OR_DEPARTMENT_OTHER): Payer: Self-pay

## 2024-07-09 ENCOUNTER — Other Ambulatory Visit (HOSPITAL_BASED_OUTPATIENT_CLINIC_OR_DEPARTMENT_OTHER): Payer: Self-pay

## 2024-07-14 ENCOUNTER — Encounter: Payer: Self-pay | Admitting: Internal Medicine

## 2024-07-16 NOTE — Telephone Encounter (Signed)
 Will check it out

## 2024-07-29 ENCOUNTER — Encounter: Payer: Self-pay | Admitting: Internal Medicine

## 2024-07-29 ENCOUNTER — Ambulatory Visit: Admitting: Internal Medicine

## 2024-07-29 VITALS — BP 154/84 | HR 74 | Temp 97.8°F | Ht 66.0 in | Wt 164.8 lb

## 2024-07-29 DIAGNOSIS — Z7409 Other reduced mobility: Secondary | ICD-10-CM | POA: Diagnosis not present

## 2024-07-29 DIAGNOSIS — Z79899 Other long term (current) drug therapy: Secondary | ICD-10-CM

## 2024-07-29 DIAGNOSIS — E118 Type 2 diabetes mellitus with unspecified complications: Secondary | ICD-10-CM | POA: Diagnosis not present

## 2024-07-29 DIAGNOSIS — E039 Hypothyroidism, unspecified: Secondary | ICD-10-CM

## 2024-07-29 DIAGNOSIS — I10 Essential (primary) hypertension: Secondary | ICD-10-CM | POA: Diagnosis not present

## 2024-07-29 DIAGNOSIS — U099 Post covid-19 condition, unspecified: Secondary | ICD-10-CM

## 2024-07-29 LAB — POCT GLYCOSYLATED HEMOGLOBIN (HGB A1C): Hemoglobin A1C: 6.6 % — AB (ref 4.0–5.6)

## 2024-07-29 NOTE — Progress Notes (Signed)
 Chief Complaint  Patient presents with   Medical Management of Chronic Issues    Pt is here for her f/u. Also would like to address information that she sent on mychart on 9/2.     HPI: Valerie Brady 78 y.o. come in for Chronic disease management   DM last A1c 7 due for  recheck on metformin  and lsi   no neuropathy dm s  HT up today cause of stress HLD statin intolerant  on repatha   per cards   initially praluent    insurance depending has upcoming lipid check  Hypothyroid  no change  Post  covid symdrome  sx  is consdiering researt on nicotine  as sx rx   however her bigget problem is fatigue and muscl aches   she can adjust to the mental fogginess . In consider ing alternative  interventions not sure.   May have felt a bit worse after last vaccine booster in August   baseline had been beter  Feels like wants to stay in bed all day and not a  primary depressed mood ROS: See pertinent positives and negatives per HPI.  Mid chest ? Someitm feels constrained  mucous cough but no true dysphagia other sx   Past Medical History:  Diagnosis Date   Carotidynia    per pt's pcp note effects right side   COVID-19 long hauler manifesting chronic muscle pain    Dry eye syndrome of both eyes    Endometrial polyp    GERD (gastroesophageal reflux disease)    Hashimoto's thyroiditis    followed by pcp   History of diverticulitis of colon 2016   History of gestational diabetes    History of recurrent UTIs    urologist---- dr cam   History of syncope 02/2010   ED visit due to near syncope/ chest pain/ bradycardia,  found due to taking benicar,  per pcp note had negative cardiology work-up   Hyperlipidemia, mixed    cardiology--- dr mona;   statin intolerence causes myalgias/ fatigue and zetia  causes GI issues   Hypertension    Hypothyroidism    Long COVID    PMB (postmenopausal bleeding)    SI (sacroiliac) joint dysfunction    Somatic dysfunction    followed by dr jeneane. claudene;   multiple regions--- entire spine and rib cage   Type 2 diabetes mellitus (HCC)    followed by pcp  (07-08-2023  per pt check blood sugar daily in am fasting , at times in afternoon,  fasting average 120)   Wears glasses     Family History  Problem Relation Age of Onset   Hypertension Mother    Dementia Mother    Heart disease Father    Dementia Father    Diabetes Father    Hypertension Brother     Social History   Socioeconomic History   Marital status: Married    Spouse name: Not on file   Number of children: Not on file   Years of education: Not on file   Highest education level: Professional school degree (e.g., MD, DDS, DVM, JD)  Occupational History   Occupation: retired physician    Comment: internist  Tobacco Use   Smoking status: Never   Smokeless tobacco: Never  Vaping Use   Vaping status: Never Used  Substance and Sexual Activity   Alcohol use: Not Currently    Comment: occ wine   Drug use: Never   Sexual activity: Not Currently    Birth  control/protection: Post-menopausal  Other Topics Concern   Not on file  Social History Narrative   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   Neg tobacco    Married   HH of 5   Pet cat   Mom  Dementia   Spouse memory decline    Social Drivers of Health   Financial Resource Strain: Low Risk  (12/17/2022)   Overall Financial Resource Strain (CARDIA)    Difficulty of Paying Living Expenses: Not hard at all  Food Insecurity: No Food Insecurity (12/17/2022)   Hunger Vital Sign    Worried About Running Out of Food in the Last Year: Never true    Ran Out of Food in the Last Year: Never true  Transportation Needs: No Transportation Needs (12/17/2022)   PRAPARE - Administrator, Civil Service (Medical): No    Lack of Transportation (Non-Medical): No  Physical Activity: Unknown (12/17/2022)   Exercise Vital Sign    Days of Exercise per Week: 0 days    Minutes of Exercise per Session: Not on file  Stress: No  Stress Concern Present (12/17/2022)   [employer] of Occupational Health - Occupational Stress Questionnaire    Feeling of Stress : Only a little  Social Connections: Moderately Isolated (12/17/2022)   Social Connection and Isolation Panel    Frequency of Communication with Friends and Family: More than three times a week    Frequency of Social Gatherings with Friends and Family: Twice a week    Attends Religious Services: Never    Database administrator or Organizations: No    Attends Engineer, structural: Not on file    Marital Status: Married    Outpatient Medications Prior to Visit  Medication Sig Dispense Refill   Acetaminophen  (TYLENOL  PO) Take by mouth daily.     carvedilol  (COREG ) 25 MG tablet Take 1 tablet (25 mg total) by mouth 2 (two) times daily with a meal. 180 tablet 1   Cholecalciferol (VITAMIN D3) 50 MCG (2000 UT) capsule Take 2,000 Units by mouth in the morning.     Coenzyme Q10 (COQ-10) 200 MG CAPS Take 400 mg by mouth in the morning.     CRANBERRY PO Take by mouth.     Cyanocobalamin  (B-12) 5000 MCG CAPS Take 1 capsule by mouth once a week.     diazepam  (VALIUM ) 5 MG tablet PLACE 1 TABLET VAGINALLY NIGHTLY AS NEEDED FOR PELVIC SPASMS 20 tablet 0   estradiol  (ESTRACE ) 0.1 MG/GM vaginal cream INSERT 1 GRAM VAGINALLY 3 TIMES WEEKLY AS DIRECTED 42.5 g 6   Evolocumab  (REPATHA  SURECLICK) 140 MG/ML SOAJ Inject 140 mg into the skin every 14 (fourteen) days. 2 mL 3   famotidine (PEPCID) 20 MG tablet Take 20 mg by mouth as needed for heartburn or indigestion.     gabapentin  (NEURONTIN ) 300 MG capsule Take 1 capsule (300 mg total) by mouth at bedtime. 90 capsule 3   LANCETS MICRO THIN 33G MISC Use to test blood glucose once daily. 100 each 3   levothyroxine  (SYNTHROID ) 100 MCG tablet Take 1 tablet (100 mcg total) by mouth daily before breakfast. 90 tablet 2   loratadine (CLARITIN) 10 MG tablet Take 10 mg by mouth daily as needed for allergies.     Melatonin 5 MG TABS  Take 5 mg by mouth at bedtime.     metFORMIN  (GLUCOPHAGE ) 1000 MG tablet TAKE 1 TABLET(1000 MG) BY MOUTH TWICE DAILY 180 tablet 1   NON FORMULARY  Take 25 mg by mouth at bedtime as needed (pain/sleep). CBD 25mg  tablet     norethindrone  (AYGESTIN ) 5 MG tablet Take 1 tablet (5 mg total) by mouth daily. 90 tablet 4   ondansetron  (ZOFRAN ) 4 MG tablet Take 1 tablet (4 mg total) by mouth every 8 (eight) hours as needed for nausea or vomiting. 20 tablet 1   TRUE METRIX BLOOD GLUCOSE TEST test strip CHECK BLOOD SUGAR ONCE A DAY. 50 each PRN   gabapentin  (NEURONTIN ) 300 MG capsule Take 1 capsule (300 mg total) by mouth 3 (three) times daily as needed. (Patient taking differently: Take 300 mg by mouth at bedtime.) 90 capsule 1   Gabapentin  Enacarbil ER (HORIZANT ) 300 MG TBCR Take 1 tablet (300 mg total) by mouth daily. 30 tablet 3   nitrofurantoin , macrocrystal-monohydrate, (MACROBID ) 100 MG capsule Take 1 capsule (100 mg total) by mouth 2 (two) times daily. 10 capsule 0   No facility-administered medications prior to visit.     EXAM:  BP (!) 154/84 (BP Location: Left Arm, Patient Position: Sitting, Cuff Size: Normal)   Pulse 74   Temp 97.8 F (36.6 C) (Oral)   Ht 5' 6 (1.676 m)   Wt 164 lb 12.8 oz (74.8 kg)   SpO2 97%   BMI 26.60 kg/m   Body mass index is 26.6 kg/m.  GENERAL: vitals reviewed and listed above, alert, oriented, appears well hydrated and in no acute distress HEENT: atraumatic, conjunctiva  clear, no obvious abnormalities on inspection of external nose and ears  NECK: no obvious masses on inspection palpation  LUNGS: clear to auscultation bilaterally, no wheezes, rales or rhonchi, good air movement CV: HRRR, no clubbing cyanosis or  peripheral edema nl cap refill  MS: moves all extremities without noticeable focal  abnormality PSYCH: pleasant and cooperative, no obvious depression or anxiety cognition seems intact  non focal findings  independent  movement     Lab Results   Component Value Date   WBC 11.2 (H) 08/20/2023   HGB 12.8 08/20/2023   HCT 39.7 08/20/2023   PLT 389 08/20/2023   GLUCOSE 131 (H) 08/20/2023   CHOL 312 (H) 08/25/2021   TRIG 317.0 (H) 08/25/2021   HDL 49.30 08/25/2021   LDLDIRECT 197.0 08/25/2021   LDLCALC 174 (H) 02/27/2016   ALT 13 02/27/2023   AST 14 02/27/2023   NA 136 08/20/2023   K 3.9 08/20/2023   CL 103 08/20/2023   CREATININE 0.79 08/20/2023   BUN 11 08/20/2023   CO2 22 08/20/2023   TSH 3.16 10/14/2023   HGBA1C 6.6 (A) 07/29/2024   MICROALBUR 5.7 (H) 01/21/2024   BP Readings from Last 3 Encounters:  07/29/24 (!) 154/84  06/05/24 122/82  05/19/24 (!) 120/57    ASSESSMENT AND PLAN:  Discussed the following assessment and plan:  Controlled type 2 diabetes mellitus with complication, without long-term current use of insulin  (HCC) - Plan: POC HgB A1c, Basic metabolic panel with GFR, TSH  Post-COVID chronic decreased mobility and endurance  Essential hypertension - Plan: Basic metabolic panel with GFR, TSH  Hypothyroidism, unspecified type - Plan: Basic metabolic panel with GFR, TSH  Medication management Disc all of above  at this time no change in meds  Would prob avoid nicotine for now until better data .  Get more full spectrum light no matter .  Coping   Update labs x lipids done by cards  -Patient advised to return or notify health care team  if  new concerns arise I  personally spent a total of 35 minutes in the care of the patient today including. s.  preparing to see the patient, getting/reviewing separately obtained history, and performing a medically appropriate exam/evaluation.  Disc intervention   risk benefit of  unproven interventons until studies could be done  Future orders placed  and fu  dm seems to be in ok control   35 minutes   Patient Instructions  Take blood pressure readings twice a day for 3-5  days and then periodically .To ensure below 140/90   .Send in readings    .  Ordered  future labs .  Get  full spectrum light . Diabetes is in control.    Navie Lamoreaux K. Valeria Boza M.D.

## 2024-07-29 NOTE — Patient Instructions (Signed)
 Take blood pressure readings twice a day for 3-5  days and then periodically .To ensure below 140/90   .Send in readings    .  Ordered future labs .  Get  full spectrum light . Diabetes is in control.

## 2024-07-30 MED FILL — Estradiol Vaginal Cream 0.01%: VAGINAL | 90 days supply | Qty: 42.5 | Fill #1 | Status: AC

## 2024-08-06 ENCOUNTER — Ambulatory Visit: Admitting: Family Medicine

## 2024-08-12 ENCOUNTER — Other Ambulatory Visit (HOSPITAL_BASED_OUTPATIENT_CLINIC_OR_DEPARTMENT_OTHER): Payer: Self-pay

## 2024-08-12 ENCOUNTER — Other Ambulatory Visit (HOSPITAL_BASED_OUTPATIENT_CLINIC_OR_DEPARTMENT_OTHER): Payer: Self-pay | Admitting: Nurse Practitioner

## 2024-08-12 MED ORDER — REPATHA SURECLICK 140 MG/ML ~~LOC~~ SOAJ
140.0000 mg | SUBCUTANEOUS | 3 refills | Status: AC
  Filled 2024-08-12: qty 6, 84d supply, fill #0
  Filled 2024-10-31: qty 6, 84d supply, fill #1

## 2024-08-14 LAB — NMR, LIPOPROFILE
Cholesterol, Total: 159 mg/dL (ref 100–199)
HDL Particle Number: 27.6 umol/L — ABNORMAL LOW (ref >=30.5)
HDL-C: 30 mg/dL — ABNORMAL LOW (ref >39)
LDL Particle Number: 1119 nmol/L — ABNORMAL HIGH (ref <1000)
LDL Size: 19.5 nm — ABNORMAL LOW (ref >20.5)
LDL-C (NIH Calc): 74 mg/dL (ref 0–99)
LP-IR Score: 74 — ABNORMAL HIGH (ref <=45)
Small LDL Particle Number: 980 nmol/L — ABNORMAL HIGH (ref <=527)
Triglycerides: 339 mg/dL — ABNORMAL HIGH (ref 0–149)

## 2024-08-16 ENCOUNTER — Ambulatory Visit: Payer: Self-pay | Admitting: Nurse Practitioner

## 2024-08-31 ENCOUNTER — Other Ambulatory Visit: Payer: Self-pay

## 2024-08-31 ENCOUNTER — Other Ambulatory Visit (HOSPITAL_BASED_OUTPATIENT_CLINIC_OR_DEPARTMENT_OTHER): Payer: Self-pay

## 2024-08-31 NOTE — Progress Notes (Unsigned)
 [facility] [address] [place] [address] Phone: [phone] Subjective:   Valerie [facility], am serving as a scribe for Dr. Arthea Brady.  I'm seeing this patient by the request  of:  Panosh, Valerie POUR, MD  CC: Back and neck pain follow-up  YEP:Dlagzrupcz  Valerie Brady is a 78 y.o. female coming in with complaint of back and neck pain. OMT on 06/05/2024. Patient states same per usual. No new concerns.  Medications patient has been prescribed:   Taking:         Reviewed prior external information including notes and imaging from previsou exam, outside providers and external EMR if available.   As well as notes that were available from care everywhere and other healthcare systems.  Past medical history, social, surgical and family history all reviewed in electronic medical record.  No pertanent information unless stated regarding to the chief complaint.   Past Medical History:  Diagnosis Date   Carotidynia    per pt's pcp note effects right side   COVID-19 long hauler manifesting chronic muscle pain    Dry eye syndrome of both eyes    Endometrial polyp    GERD (gastroesophageal reflux disease)    Hashimoto's thyroiditis    followed by pcp   History of diverticulitis of colon 2016   History of gestational diabetes    History of recurrent UTIs    urologist---- dr cam   History of syncope 02/2010   ED visit due to near syncope/ chest pain/ bradycardia,  found due to taking benicar,  per pcp note had negative cardiology work-up   Hyperlipidemia, mixed    cardiology--- dr mona;   statin intolerence causes myalgias/ fatigue and zetia  causes GI issues   Hypertension    Hypothyroidism    Long COVID    PMB (postmenopausal bleeding)    SI (sacroiliac) joint dysfunction    Somatic dysfunction    followed by dr jeneane. Brady;  multiple regions--- entire spine and rib cage   Type 2 diabetes mellitus (HCC)    followed by pcp   (07-08-2023  per pt check blood sugar daily in am fasting , at times in afternoon,  fasting average 120)   Wears glasses     Allergies  Allergen Reactions   Bee Venom Swelling   Egg Protein-Containing Drug Products Nausea And Vomiting   Enalapril Maleate Cough   Lactose Intolerance (Gi) Diarrhea   Olmesartan Other (See Comments)     Gas and chest pain/bradycardia    Sulfamethoxazole -Trimethoprim  Other (See Comments)    Caused Swollen lips with anti-biotic   Almond (Diagnostic) Other (See Comments)    Gi upset   Amlodipine  Other (See Comments)    Fatigue lethargy at 5 mg dose   Latex Rash   Zetia  [Ezetimibe ] Other (See Comments)    GI cramps     Review of Systems:  No headache, visual changes, nausea, vomiting, diarrhea, constipation, dizziness, abdominal pain, skin rash, fevers, chills, night sweats, weight loss, swollen lymph nodes, body aches, joint swelling, chest pain, shortness of breath, mood changes. POSITIVE muscle aches  Objective  Blood pressure 124/72, pulse 85, height 5' 6 (1.676 m), weight 166 lb (75.3 kg), SpO2 96%.   General: No apparent distress alert and oriented x3 mood and affect normal, dressed appropriately.  HEENT: Pupils equal, extraocular movements intact  Respiratory: Patient's speak in full sentences and does not appear short of breath  Cardiovascular: No lower extremity edema, non  tender, no erythema  Gait relatively normal MSK:  Back does have some tightness noted in the paraspinal musculature.  Tightness in the paraspinal musculature otherwise.  Seems to be though more painful over the sacroiliac joint right greater than left.  Osteopathic findings   T6 extended rotated and side bent right inhaled rib T8 extended rotated and side bent left L3 flexed rotated and side bent right Sacrum right on right       Assessment and Plan:  SI (sacroiliac) joint dysfunction Sacroiliac joint noted, discussed icing regimen and home exercises, discussed  which activities to do and which ones to avoid.  Will continue to work on posture and ergonomics, patient has had many different allergies to other medications.  Still feels like she is having difficulty with long COVID that is having difficulty with increasing her activities.  Feels like an exacerbation after the last booster.    Nonallopathic problems  Decision today to treat with OMT was based on Physical Exam  After verbal consent patient was treated with  ME, FPR techniques in thoracic, lumbar, and sacral  areas  Patient tolerated the procedure well with improvement in symptoms  Patient given exercises, stretches and lifestyle modifications  See medications in patient instructions if given  Patient will follow up in 4-8 weeks     The above documentation has been reviewed and is accurate and complete Valerie Plant M [name], DO         Note: This dictation was prepared with Dragon dictation along with smaller phrase technology. Any transcriptional errors that result from this process are unintentional.

## 2024-09-01 ENCOUNTER — Encounter: Payer: Self-pay | Admitting: Family Medicine

## 2024-09-01 ENCOUNTER — Ambulatory Visit: Admitting: Family Medicine

## 2024-09-01 VITALS — BP 124/72 | HR 85 | Ht 66.0 in | Wt 166.0 lb

## 2024-09-01 DIAGNOSIS — M533 Sacrococcygeal disorders, not elsewhere classified: Secondary | ICD-10-CM | POA: Diagnosis not present

## 2024-09-01 DIAGNOSIS — M9904 Segmental and somatic dysfunction of sacral region: Secondary | ICD-10-CM

## 2024-09-01 DIAGNOSIS — M9902 Segmental and somatic dysfunction of thoracic region: Secondary | ICD-10-CM | POA: Diagnosis not present

## 2024-09-01 DIAGNOSIS — M9903 Segmental and somatic dysfunction of lumbar region: Secondary | ICD-10-CM | POA: Diagnosis not present

## 2024-09-01 NOTE — Assessment & Plan Note (Signed)
 Sacroiliac joint noted, discussed icing regimen and home exercises, discussed which activities to do and which ones to avoid.  Will continue to work on posture and ergonomics, patient has had many different allergies to other medications.  Still feels like she is having difficulty with long COVID that is having difficulty with increasing her activities.  Feels like an exacerbation after the last booster.

## 2024-09-01 NOTE — Patient Instructions (Signed)
 Good to see you! Hope you start feeling better Keep being active See you again in 3 months

## 2024-09-07 LAB — OPHTHALMOLOGY REPORT-SCANNED

## 2024-09-09 MED FILL — Levothyroxine Sodium Tab 100 MCG: ORAL | 90 days supply | Qty: 90 | Fill #1 | Status: AC

## 2024-09-17 ENCOUNTER — Ambulatory Visit (HOSPITAL_BASED_OUTPATIENT_CLINIC_OR_DEPARTMENT_OTHER): Admitting: Obstetrics & Gynecology

## 2024-09-21 ENCOUNTER — Encounter: Payer: Self-pay | Admitting: Internal Medicine

## 2024-09-30 ENCOUNTER — Other Ambulatory Visit (HOSPITAL_BASED_OUTPATIENT_CLINIC_OR_DEPARTMENT_OTHER): Payer: Self-pay

## 2024-10-02 ENCOUNTER — Encounter: Payer: Self-pay | Admitting: Internal Medicine

## 2024-10-04 ENCOUNTER — Other Ambulatory Visit (HOSPITAL_BASED_OUTPATIENT_CLINIC_OR_DEPARTMENT_OTHER): Payer: Self-pay

## 2024-10-04 ENCOUNTER — Encounter: Payer: Self-pay | Admitting: Internal Medicine

## 2024-10-07 NOTE — Telephone Encounter (Signed)
 There are 3 different  gabapentins on the med list  and different prescribers   Please clarify and update the med list   and provider prescribing   How often is she currently taking .  I would be ok  prescribing but  need only one managing on the medlist

## 2024-10-14 ENCOUNTER — Other Ambulatory Visit (HOSPITAL_BASED_OUTPATIENT_CLINIC_OR_DEPARTMENT_OTHER): Payer: Self-pay

## 2024-10-14 MED ORDER — ESTRADIOL 0.01 % VA CREA
1.0000 g | TOPICAL_CREAM | VAGINAL | 3 refills | Status: AC
  Filled 2024-10-14: qty 42.5, 90d supply, fill #0

## 2024-10-21 NOTE — Telephone Encounter (Signed)
 Attempted follow up with pt. Left a voicemail to call us  back.

## 2024-10-22 NOTE — Telephone Encounter (Signed)
 I am okk with prescribing  30 - 90 days  as she wishes       please   optimize the med list

## 2024-10-24 ENCOUNTER — Other Ambulatory Visit (HOSPITAL_BASED_OUTPATIENT_CLINIC_OR_DEPARTMENT_OTHER): Payer: Self-pay

## 2024-10-24 ENCOUNTER — Encounter (HOSPITAL_BASED_OUTPATIENT_CLINIC_OR_DEPARTMENT_OTHER): Payer: Self-pay

## 2024-10-27 ENCOUNTER — Other Ambulatory Visit: Payer: Self-pay

## 2024-10-27 MED ORDER — GABAPENTIN 300 MG PO CAPS: 300.0000 mg | ORAL_CAPSULE | Freq: Three times a day (TID) | ORAL | 1 refills | Status: DC | PRN

## 2024-10-27 MED ORDER — GABAPENTIN 300 MG PO CAPS: 300.0000 mg | ORAL_CAPSULE | Freq: Three times a day (TID) | ORAL | 1 refills | Status: AC | PRN

## 2024-10-29 ENCOUNTER — Other Ambulatory Visit

## 2024-11-09 ENCOUNTER — Other Ambulatory Visit (HOSPITAL_BASED_OUTPATIENT_CLINIC_OR_DEPARTMENT_OTHER): Payer: Self-pay

## 2024-11-11 ENCOUNTER — Other Ambulatory Visit (HOSPITAL_BASED_OUTPATIENT_CLINIC_OR_DEPARTMENT_OTHER): Payer: Self-pay

## 2024-11-12 ENCOUNTER — Encounter (HOSPITAL_BASED_OUTPATIENT_CLINIC_OR_DEPARTMENT_OTHER): Payer: Self-pay | Admitting: Obstetrics & Gynecology

## 2024-11-13 ENCOUNTER — Other Ambulatory Visit (HOSPITAL_BASED_OUTPATIENT_CLINIC_OR_DEPARTMENT_OTHER): Payer: Self-pay | Admitting: Obstetrics & Gynecology

## 2024-11-13 DIAGNOSIS — N3941 Urge incontinence: Secondary | ICD-10-CM

## 2024-11-13 MED ORDER — DIAZEPAM 5 MG PO TABS: ORAL_TABLET | ORAL | 0 refills | Status: AC

## 2024-11-16 ENCOUNTER — Encounter (HOSPITAL_BASED_OUTPATIENT_CLINIC_OR_DEPARTMENT_OTHER): Payer: Self-pay | Admitting: Obstetrics & Gynecology

## 2024-11-16 ENCOUNTER — Other Ambulatory Visit (HOSPITAL_COMMUNITY)
Admission: RE | Admit: 2024-11-16 | Discharge: 2024-11-16 | Disposition: A | Discharge status: Home / Self Care | Source: Ambulatory Visit | Attending: Obstetrics & Gynecology | Admitting: Obstetrics & Gynecology

## 2024-11-16 ENCOUNTER — Other Ambulatory Visit (HOSPITAL_BASED_OUTPATIENT_CLINIC_OR_DEPARTMENT_OTHER): Admitting: Obstetrics & Gynecology

## 2024-11-16 ENCOUNTER — Other Ambulatory Visit (HOSPITAL_BASED_OUTPATIENT_CLINIC_OR_DEPARTMENT_OTHER): Payer: Self-pay

## 2024-11-16 DIAGNOSIS — N8500 Endometrial hyperplasia, unspecified: Secondary | ICD-10-CM

## 2024-11-16 MED ORDER — NORETHINDRONE ACETATE 5 MG PO TABS
5.0000 mg | ORAL_TABLET | Freq: Every day | ORAL | 4 refills | Status: AC
  Filled 2024-11-16: qty 90, 90d supply, fill #0

## 2024-11-16 NOTE — Progress Notes (Signed)
 "  GYNECOLOGY  VISIT  CC:  Endometrial Biopsy   HPI: 79 y.o. G22P2012 Married White or Caucasian female here endometrial biopsy.  Denies vaginal bleeding.  She has hx of PMP bleeding and hysteroscopy with polyp resection, D&C 10;2024 with pathology showing endometrial hyperplasia without atypia (no EIN) and fragments of endometrial polyp.  No carcinoma was noted.  She is on norethindrone  5mg  daily.  Denies vaginal bleeding.  Has questions about maintenance progesterone therapy.  Discussed continuation for now.  Had endometrial biopsy 03/16/2024 as 6 month follow up from hysteroscopy with findings showing scant fragments of benign endocervical mucosa and endometrial epithelium.    No LMP recorded. Patient is postmenopausal.  Past Medical History:  Diagnosis Date   Carotidynia    per pt's pcp note effects right side   COVID-19 long hauler manifesting chronic muscle pain    Dry eye syndrome of both eyes    Endometrial polyp    GERD (gastroesophageal reflux disease)    Hashimoto's thyroiditis    followed by pcp   History of diverticulitis of colon 2016   History of gestational diabetes    History of recurrent UTIs    urologist---- dr cam   History of syncope 02/2010   ED visit due to near syncope/ chest pain/ bradycardia,  found due to taking benicar,  per pcp note had negative cardiology work-up   Hyperlipidemia, mixed    cardiology--- dr mona;   statin intolerence causes myalgias/ fatigue and zetia  causes GI issues   Hypertension    Hypothyroidism    Long COVID    PMB (postmenopausal bleeding)    SI (sacroiliac) joint dysfunction    Somatic dysfunction    followed by dr jeneane. claudene;  multiple regions--- entire spine and rib cage   Type 2 diabetes mellitus (HCC)    followed by pcp  (07-08-2023  per pt check blood sugar daily in am fasting , at times in afternoon,  fasting average 120)   Wears glasses     MEDS:  Reviewed in EPIC  ALLERGIES: Bee venom, Egg protein-containing  drug products, Enalapril maleate, Lactose intolerance (gi), Olmesartan, Sulfamethoxazole -trimethoprim , Almond (diagnostic), Amlodipine , Latex, and Zetia  [ezetimibe ]  SH:  married, non smoker  Review of Systems  Constitutional: Negative.   Genitourinary: Negative.     PHYSICAL EXAMINATION:    BP (!) 128/54   Pulse 75   Ht 5' 5 (1.651 m) Comment: Reported  Wt 164 lb 9.6 oz (74.7 kg)   BMI 27.39 kg/m     General appearance: alert, cooperative and appears stated age Lymph:  no inguinal LAD noted  Pelvic: External genitalia:  no lesions              Urethra:  normal appearing urethra with no masses, tenderness or lesions              Bartholins and Skenes: normal                 Vagina: normal mucosa without prolapse or lesions              Cervix: no lesions              Bimanual Exam:  Uterus:  normal size, contour, position, consistency, mobility, non-tender              Adnexa: no mass, fullness, tenderness  Endometrial biopsy recommended.  Discussed with patient.  Verbal and written consent obtained.   Procedure:  Speculum placed.  Cervix visualized  and cleansed with betadine  prep.  A single toothed tenaculum was applied to the anterior lip of the cervix.  Endometrial pipelle was advanced through the cervix into the endometrial cavity without difficulty.  Pipelle passed to7cm.  Suction applied and pipelle removed with scant tissue sample obtained.  Biopsy repeated with similar findings.  Tenaculum removed.  No bleeding noted.  Patient tolerated procedure well.   Chaperone was present for exam.  Assessment/Plan: 1. Endometrial hyperplasia - will continue oral progesterone therapy.  Endometrial biopsy pending from today.  This will be 14 months since hysteroscopy and polyp resection.   - norethindrone  (AYGESTIN ) 5 MG tablet; Take 1 tablet (5 mg total) by mouth daily.  Dispense: 90 tablet; Refill: 4 - Surgical pathology( [facility]/ [facility])    "

## 2024-11-17 ENCOUNTER — Other Ambulatory Visit (INDEPENDENT_AMBULATORY_CARE_PROVIDER_SITE_OTHER)

## 2024-11-17 DIAGNOSIS — E039 Hypothyroidism, unspecified: Secondary | ICD-10-CM

## 2024-11-17 DIAGNOSIS — E118 Type 2 diabetes mellitus with unspecified complications: Secondary | ICD-10-CM | POA: Diagnosis not present

## 2024-11-17 DIAGNOSIS — Z7984 Long term (current) use of oral hypoglycemic drugs: Secondary | ICD-10-CM

## 2024-11-17 DIAGNOSIS — I10 Essential (primary) hypertension: Secondary | ICD-10-CM

## 2024-11-17 LAB — BASIC METABOLIC PANEL WITH GFR
BUN: 19 mg/dL (ref 6–23)
CO2: 25 meq/L (ref 19–32)
Calcium: 9.5 mg/dL (ref 8.4–10.5)
Chloride: 103 meq/L (ref 96–112)
Creatinine, Ser: 1.02 mg/dL (ref 0.40–1.20)
GFR: 53.23 mL/min — ABNORMAL LOW
Glucose, Bld: 197 mg/dL — ABNORMAL HIGH (ref 70–99)
Potassium: 4.1 meq/L (ref 3.5–5.1)
Sodium: 136 meq/L (ref 135–145)

## 2024-11-17 LAB — TSH: TSH: 2.53 u[IU]/mL (ref 0.35–5.50)

## 2024-11-18 ENCOUNTER — Ambulatory Visit: Payer: Self-pay | Admitting: Internal Medicine

## 2024-11-18 LAB — SURGICAL PATHOLOGY

## 2024-11-18 NOTE — Progress Notes (Signed)
 Gfr e dec x 1  tsh in range   will fu at upcoming visit .

## 2024-11-25 ENCOUNTER — Other Ambulatory Visit (HOSPITAL_BASED_OUTPATIENT_CLINIC_OR_DEPARTMENT_OTHER): Payer: Self-pay

## 2024-11-25 ENCOUNTER — Other Ambulatory Visit: Payer: Self-pay | Admitting: Internal Medicine

## 2024-11-25 MED ORDER — METFORMIN HCL 1000 MG PO TABS
1000.0000 mg | ORAL_TABLET | Freq: Two times a day (BID) | ORAL | 1 refills | Status: AC
  Filled 2024-11-25: qty 180, 90d supply, fill #0

## 2024-12-01 ENCOUNTER — Ambulatory Visit: Admitting: Internal Medicine

## 2024-12-01 NOTE — Progress Notes (Unsigned)
 " [facility] [address] [place] [address] Phone: [phone] Subjective:    I'm seeing this patient by the request  of:  Panosh, Apolinar POUR, MD  CC:   YEP:Dlagzrupcz  Wayne Brunker is a 79 y.o. female coming in with complaint of back and neck pain. OMT on 09/01/2024. Patient states   Medications patient has been prescribed:   Taking:         Reviewed prior external information including notes and imaging from previsou exam, outside providers and external EMR if available.   As well as notes that were available from care everywhere and other healthcare systems.  Past medical history, social, surgical and family history all reviewed in electronic medical record.  No pertanent information unless stated regarding to the chief complaint.   Past Medical History:  Diagnosis Date   Carotidynia    per pt's pcp note effects right side   COVID-19 long hauler manifesting chronic muscle pain    Dry eye syndrome of both eyes    Endometrial polyp    GERD (gastroesophageal reflux disease)    Hashimoto's thyroiditis    followed by pcp   History of diverticulitis of colon 2016   History of gestational diabetes    History of recurrent UTIs    urologist---- dr cam   History of syncope 02/2010   ED visit due to near syncope/ chest pain/ bradycardia,  found due to taking benicar,  per pcp note had negative cardiology work-up   Hyperlipidemia, mixed    cardiology--- dr mona;   statin intolerence causes myalgias/ fatigue and zetia  causes GI issues   Hypertension    Hypothyroidism    Long COVID    PMB (postmenopausal bleeding)    SI (sacroiliac) joint dysfunction    Somatic dysfunction    followed by dr jeneane. claudene;  multiple regions--- entire spine and rib cage   Type 2 diabetes mellitus (HCC)    followed by pcp  (07-08-2023  per pt check blood sugar daily in am fasting , at times in afternoon,  fasting average 120)   Wears glasses      Allergies[1]   Review of Systems:  No headache, visual changes, nausea, vomiting, diarrhea, constipation, dizziness, abdominal pain, skin rash, fevers, chills, night sweats, weight loss, swollen lymph nodes, body aches, joint swelling, chest pain, shortness of breath, mood changes. POSITIVE muscle aches  Objective  There were no vitals taken for this visit.   General: No apparent distress alert and oriented x3 mood and affect normal, dressed appropriately.  HEENT: Pupils equal, extraocular movements intact  Respiratory: Patient's speak in full sentences and does not appear short of breath  Cardiovascular: No lower extremity edema, non tender, no erythema  Gait MSK:  Back   Osteopathic findings  C2 flexed rotated and side bent right C6 flexed rotated and side bent left T3 extended rotated and side bent right inhaled rib T9 extended rotated and side bent left L2 flexed rotated and side bent right Sacrum right on right       Assessment and Plan:  No problem-specific Assessment & Plan notes found for this encounter.    Nonallopathic problems  Decision today to treat with OMT was based on Physical Exam  After verbal consent patient was treated with HVLA, ME, FPR techniques in cervical, rib, thoracic, lumbar, and sacral  areas  Patient tolerated the procedure well with improvement in symptoms  Patient given exercises, stretches and lifestyle modifications  See medications in patient instructions if given  Patient will follow up in 4-8 weeks             Note: This dictation was prepared with Dragon dictation along with smaller phrase technology. Any transcriptional errors that result from this process are unintentional.            [1]  Allergies Allergen Reactions   Bee Venom Swelling   Egg Protein-Containing Drug Products Nausea And Vomiting   Enalapril Maleate Cough   Lactose Intolerance (Gi) Diarrhea   Olmesartan Other (See Comments)     Gas and  chest pain/bradycardia    Sulfamethoxazole -Trimethoprim  Other (See Comments)    Caused Swollen lips with anti-biotic   Almond (Diagnostic) Other (See Comments)    Gi upset   Amlodipine  Other (See Comments)    Fatigue lethargy at 5 mg dose   Latex Rash   Zetia  [Ezetimibe ] Other (See Comments)    GI cramps   "

## 2024-12-02 ENCOUNTER — Ambulatory Visit: Admitting: Family Medicine

## 2024-12-09 ENCOUNTER — Encounter: Payer: Self-pay | Admitting: Internal Medicine

## 2024-12-09 ENCOUNTER — Ambulatory Visit (INDEPENDENT_AMBULATORY_CARE_PROVIDER_SITE_OTHER): Admitting: Internal Medicine

## 2024-12-09 ENCOUNTER — Other Ambulatory Visit (HOSPITAL_BASED_OUTPATIENT_CLINIC_OR_DEPARTMENT_OTHER): Payer: Self-pay

## 2024-12-09 ENCOUNTER — Other Ambulatory Visit: Payer: Self-pay

## 2024-12-09 VITALS — BP 138/72 | HR 78 | Temp 97.9°F | Ht 65.0 in | Wt 165.8 lb

## 2024-12-09 DIAGNOSIS — H6120 Impacted cerumen, unspecified ear: Secondary | ICD-10-CM

## 2024-12-09 DIAGNOSIS — Z7984 Long term (current) use of oral hypoglycemic drugs: Secondary | ICD-10-CM | POA: Diagnosis not present

## 2024-12-09 DIAGNOSIS — E118 Type 2 diabetes mellitus with unspecified complications: Secondary | ICD-10-CM

## 2024-12-09 DIAGNOSIS — I10 Essential (primary) hypertension: Secondary | ICD-10-CM | POA: Diagnosis not present

## 2024-12-09 DIAGNOSIS — M791 Myalgia, unspecified site: Secondary | ICD-10-CM

## 2024-12-09 DIAGNOSIS — Z79899 Other long term (current) drug therapy: Secondary | ICD-10-CM | POA: Diagnosis not present

## 2024-12-09 DIAGNOSIS — E039 Hypothyroidism, unspecified: Secondary | ICD-10-CM

## 2024-12-09 LAB — POCT GLYCOSYLATED HEMOGLOBIN (HGB A1C): Hemoglobin A1C: 7.1 % — AB (ref 4.0–5.6)

## 2024-12-09 MED ORDER — LEVOTHYROXINE SODIUM 100 MCG PO TABS
100.0000 ug | ORAL_TABLET | Freq: Every day | ORAL | 2 refills | Status: AC
  Filled 2024-12-09: qty 90, 90d supply, fill #0

## 2024-12-09 MED ORDER — CARVEDILOL 25 MG PO TABS
25.0000 mg | ORAL_TABLET | Freq: Two times a day (BID) | ORAL | 2 refills | Status: AC
  Filled 2024-12-09: qty 180, 90d supply, fill #0

## 2024-12-09 NOTE — Progress Notes (Unsigned)
 "  Chief Complaint  Patient presents with   Medical Management of Chronic Issues   Ear Fullness    Pt would like both ears clean.     HPI: Valerie Brady 79 y.o. come in for Chronic disease management  DM: Elevated Lpa : Recent  enometrial hyperplasia evaluation  per Dr Cleotilde . BP control    ROS: See pertinent positives and negatives per HPI.  Past Medical History:  Diagnosis Date   Carotidynia    per pt's pcp note effects right side   COVID-19 long hauler manifesting chronic muscle pain    Dry eye syndrome of both eyes    Endometrial polyp    GERD (gastroesophageal reflux disease)    Hashimoto's thyroiditis    followed by pcp   History of diverticulitis of colon 2016   History of gestational diabetes    History of recurrent UTIs    urologist---- dr cam   History of syncope 02/2010   ED visit due to near syncope/ chest pain/ bradycardia,  found due to taking benicar,  per pcp note had negative cardiology work-up   Hyperlipidemia, mixed    cardiology--- dr mona;   statin intolerence causes myalgias/ fatigue and zetia  causes GI issues   Hypertension    Hypothyroidism    Long COVID    PMB (postmenopausal bleeding)    SI (sacroiliac) joint dysfunction    Somatic dysfunction    followed by dr jeneane. claudene;  multiple regions--- entire spine and rib cage   Type 2 diabetes mellitus (HCC)    followed by pcp  (07-08-2023  per pt check blood sugar daily in am fasting , at times in afternoon,  fasting average 120)   Wears glasses     Family History  Problem Relation Age of Onset   Hypertension Mother    Dementia Mother    Heart disease Father    Dementia Father    Diabetes Father    Hypertension Brother     Social History   Socioeconomic History   Marital status: Married    Spouse name: Not on file   Number of children: Not on file   Years of education: Not on file   Highest education level: Professional school degree (e.g., MD, DDS, DVM, JD)  Occupational  History   Occupation: retired physician    Comment: internist  Tobacco Use   Smoking status: Never   Smokeless tobacco: Never  Vaping Use   Vaping status: Never Used  Substance and Sexual Activity   Alcohol use: Not Currently    Comment: occ wine   Drug use: Never   Sexual activity: Not Currently    Birth control/protection: Post-menopausal  Other Topics Concern   Not on file  Social History Narrative   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   Neg tobacco    Married   HH of 5   Pet cat   Mom  Dementia   Spouse memory decline    Social Drivers of Health   Tobacco Use: Low Risk (12/09/2024)   Patient History    Smoking Tobacco Use: Never    Smokeless Tobacco Use: Never    Passive Exposure: Not on file  Financial Resource Strain: Low Risk (12/17/2022)   Overall Financial Resource Strain (CARDIA)    Difficulty of Paying Living Expenses: Not hard at all  Food Insecurity: No Food Insecurity (12/17/2022)   Hunger Vital Sign    Worried About Running Out of Food in the Last  Year: Never true    Ran Out of Food in the Last Year: Never true  Transportation Needs: No Transportation Needs (12/17/2022)   PRAPARE - Administrator, Civil Service (Medical): No    Lack of Transportation (Non-Medical): No  Physical Activity: Unknown (12/17/2022)   Exercise Vital Sign    Days of Exercise per Week: 0 days    Minutes of Exercise per Session: Not on file  Stress: No Stress Concern Present (12/17/2022)   [employer] of Occupational Health - Occupational Stress Questionnaire    Feeling of Stress : Only a little  Social Connections: Moderately Isolated (12/17/2022)   Social Connection and Isolation Panel    Frequency of Communication with Friends and Family: More than three times a week    Frequency of Social Gatherings with Friends and Family: Twice a week    Attends Religious Services: Never    Database Administrator or Organizations: No    Attends Hospital Doctor: Not on file    Marital Status: Married  Depression (PHQ2-9): Low Risk (11/16/2024)   Depression (PHQ2-9)    PHQ-2 Score: 0  Alcohol Screen: Low Risk (12/17/2022)   Alcohol Screen    Last Alcohol Screening Score (AUDIT): 1  Housing: Not on file  Utilities: Not on file  Health Literacy: Not on file    Outpatient Medications Prior to Visit  Medication Sig Dispense Refill   Acetaminophen  (TYLENOL  PO) Take by mouth daily.     carvedilol  (COREG ) 25 MG tablet Take 1 tablet (25 mg total) by mouth 2 (two) times daily with a meal. 180 tablet 1   Cholecalciferol (VITAMIN D3) 50 MCG (2000 UT) capsule Take 2,000 Units by mouth in the morning.     Coenzyme Q10 (COQ-10) 200 MG CAPS Take 400 mg by mouth in the morning.     Cyanocobalamin  (B-12) 5000 MCG CAPS Take 1 capsule by mouth once a week. (Patient taking differently: Take 1 capsule by mouth. Once a month)     diazepam  (VALIUM ) 5 MG tablet PLACE 1 TABLET VAGINALLY NIGHTLY AS NEEDED FOR PELVIC SPASMS 20 tablet 0   estradiol  (ESTRACE ) 0.01 % CREA vaginal cream Place 1 g vaginally 3 (three) times a week. 42.5 g 3   Evolocumab  (REPATHA  SURECLICK) 140 MG/ML SOAJ Inject 140 mg into the skin every 14 (fourteen) days. 6 mL 3   famotidine (PEPCID) 20 MG tablet Take 20 mg by mouth as needed for heartburn or indigestion.     gabapentin  (NEURONTIN ) 300 MG capsule Take 1 capsule (300 mg total) by mouth 3 (three) times daily as needed. 90 capsule 1   levothyroxine  (SYNTHROID ) 100 MCG tablet Take 1 tablet (100 mcg total) by mouth daily before breakfast. 90 tablet 2   loratadine (CLARITIN) 10 MG tablet Take 10 mg by mouth daily as needed for allergies.     Melatonin 5 MG TABS Take 5 mg by mouth at bedtime.     metFORMIN  (GLUCOPHAGE ) 1000 MG tablet Take 1 tablet (1,000 mg total) by mouth 2 (two) times daily. 180 tablet 1   norethindrone  (AYGESTIN ) 5 MG tablet Take 1 tablet (5 mg total) by mouth daily. 90 tablet 4   ondansetron  (ZOFRAN ) 4 MG tablet Take 1  tablet (4 mg total) by mouth every 8 (eight) hours as needed for nausea or vomiting. 20 tablet 1   CRANBERRY PO Take by mouth. (Patient not taking: Reported on 12/09/2024)     LANCETS MICRO THIN 33G MISC  Use to test blood glucose once daily. 100 each 3   NON FORMULARY Take 25 mg by mouth at bedtime as needed (pain/sleep). CBD 25mg  tablet     TRUE METRIX BLOOD GLUCOSE TEST test strip CHECK BLOOD SUGAR ONCE A DAY. 50 each PRN   No facility-administered medications prior to visit.     EXAM:  BP 138/72 (BP Location: Right Arm, Patient Position: Sitting, Cuff Size: Large)   Pulse 78   Temp 97.9 F (36.6 C) (Oral)   Ht 5' 5 (1.651 m)   Wt 165 lb 12.8 oz (75.2 kg)   SpO2 96%   BMI 27.59 kg/m   Body mass index is 27.59 kg/m. Wt Readings from Last 3 Encounters:  12/09/24 165 lb 12.8 oz (75.2 kg)  11/16/24 164 lb 9.6 oz (74.7 kg)  09/01/24 166 lb (75.3 kg)    GENERAL: vitals reviewed and listed above, alert, oriented, appears well hydrated and in no acute distress HEENT: atraumatic, conjunctiva  clear, no obvious abnormalities on inspection of external nose and ears OP : no lesion edema or exudate  NECK: no obvious masses on inspection palpation  LUNGS: clear to auscultation bilaterally, no wheezes, rales or rhonchi, good air movement CV: HRRR, no clubbing cyanosis or  peripheral edema nl cap refill  MS: moves all extremities without noticeable focal  abnormality PSYCH: pleasant and cooperative, no obvious depression or anxiety Lab Results  Component Value Date   WBC 11.2 (H) 08/20/2023   HGB 12.8 08/20/2023   HCT 39.7 08/20/2023   PLT 389 08/20/2023   GLUCOSE 197 (H) 11/17/2024   CHOL 312 (H) 08/25/2021   TRIG 317.0 (H) 08/25/2021   HDL 49.30 08/25/2021   LDLDIRECT 197.0 08/25/2021   LDLCALC 174 (H) 02/27/2016   ALT 13 02/27/2023   AST 14 02/27/2023   NA 136 11/17/2024   K 4.1 11/17/2024   CL 103 11/17/2024   CREATININE 1.02 11/17/2024   BUN 19 11/17/2024   CO2 25  11/17/2024   TSH 2.53 11/17/2024   HGBA1C 7.1 (A) 12/09/2024   MICROALBUR 5.7 (H) 01/21/2024   BP Readings from Last 3 Encounters:  12/09/24 138/72  11/16/24 (!) 128/54  09/01/24 124/72    ASSESSMENT AND PLAN:  Discussed the following assessment and plan:  Controlled type 2 diabetes mellitus with complication, without long-term current use of insulin  (HCC) - Plan: POC HgB A1c  Essential hypertension  Hypothyroidism, unspecified type  Medication management  Essential hypertension - inc to 1.5 of 12.5 bid for now and assess if not at goal can increase toe 25 bid as tolerated  -Patient advised to return or notify health care team  if  new concerns arise.  There are no Patient Instructions on file for this visit.   Santiel Topper K. Jere Bostrom M.D. "

## 2024-12-09 NOTE — Patient Instructions (Addendum)
 Please  send in bp readings  to ensure goals .  Lab today.  As planned .  Ok to come back  for ear flush if needed.   Consider rheum consult as discussed .

## 2024-12-10 LAB — CBC WITH DIFFERENTIAL/PLATELET
Basophils Absolute: 0.1 10*3/uL (ref 0.0–0.1)
Basophils Relative: 0.9 % (ref 0.0–3.0)
Eosinophils Absolute: 0.2 10*3/uL (ref 0.0–0.7)
Eosinophils Relative: 2.1 % (ref 0.0–5.0)
HCT: 41.2 % (ref 36.0–46.0)
Hemoglobin: 13.7 g/dL (ref 12.0–15.0)
Lymphocytes Relative: 41.9 % (ref 12.0–46.0)
Lymphs Abs: 4.3 10*3/uL — ABNORMAL HIGH (ref 0.7–4.0)
MCHC: 33.3 g/dL (ref 30.0–36.0)
MCV: 89.6 fl (ref 78.0–100.0)
Monocytes Absolute: 0.7 10*3/uL (ref 0.1–1.0)
Monocytes Relative: 6.4 % (ref 3.0–12.0)
Neutro Abs: 5.1 10*3/uL (ref 1.4–7.7)
Neutrophils Relative %: 48.7 % (ref 43.0–77.0)
Platelets: 473 10*3/uL — ABNORMAL HIGH (ref 150.0–400.0)
RBC: 4.6 Mil/uL (ref 3.87–5.11)
RDW: 13.8 % (ref 11.5–15.5)
WBC: 10.4 10*3/uL (ref 4.0–10.5)

## 2024-12-10 LAB — BASIC METABOLIC PANEL WITH GFR
BUN: 15 mg/dL (ref 6–23)
CO2: 23 meq/L (ref 19–32)
Calcium: 9.6 mg/dL (ref 8.4–10.5)
Chloride: 105 meq/L (ref 96–112)
Creatinine, Ser: 1.01 mg/dL (ref 0.40–1.20)
GFR: 53.84 mL/min — ABNORMAL LOW
Glucose, Bld: 170 mg/dL — ABNORMAL HIGH (ref 70–99)
Potassium: 4.5 meq/L (ref 3.5–5.1)
Sodium: 137 meq/L (ref 135–145)

## 2024-12-10 LAB — CK: Total CK: 46 U/L (ref 17–177)

## 2024-12-10 LAB — MICROALBUMIN / CREATININE URINE RATIO
Creatinine,U: 180.8 mg/dL
Microalb Creat Ratio: 18.1 mg/g (ref 0.0–30.0)
Microalb, Ur: 3.3 mg/dL — ABNORMAL HIGH (ref 0.7–1.9)

## 2024-12-10 LAB — C-REACTIVE PROTEIN: CRP: 0.7 mg/dL — ABNORMAL LOW (ref 1.0–20.0)

## 2024-12-11 LAB — ALDOLASE: Aldolase: 3.1 U/L

## 2024-12-11 LAB — ANTI-NUCLEAR AB-TITER (ANA TITER)
ANA TITER: 1:80 {titer} — ABNORMAL HIGH
ANA Titer 1: 1:40 {titer} — ABNORMAL HIGH

## 2024-12-11 LAB — ANA: Anti Nuclear Antibody (ANA): POSITIVE — AB

## 2024-12-14 ENCOUNTER — Ambulatory Visit: Payer: Self-pay | Admitting: Internal Medicine

## 2024-12-14 DIAGNOSIS — M791 Myalgia, unspecified site: Secondary | ICD-10-CM

## 2024-12-14 DIAGNOSIS — R7689 Other specified abnormal immunological findings in serum: Secondary | ICD-10-CM

## 2024-12-14 DIAGNOSIS — R5383 Other fatigue: Secondary | ICD-10-CM

## 2025-01-13 ENCOUNTER — Ambulatory Visit: Admitting: Family Medicine

## 2025-03-11 ENCOUNTER — Ambulatory Visit
